# Patient Record
Sex: Male | Born: 1960 | Race: White | Hispanic: No | Marital: Married | Smoking: Current some day smoker
Health system: Southern US, Community
[De-identification: ages and names within clinical notes are randomized; demographics above are authoritative.]

## PROBLEM LIST (undated history)

## (undated) ENCOUNTER — Emergency Department: Discharge status: Absent without leave | Payer: BC Managed Care – PPO

## (undated) DIAGNOSIS — D649 Anemia, unspecified: Secondary | ICD-10-CM

## (undated) DIAGNOSIS — E785 Hyperlipidemia, unspecified: Secondary | ICD-10-CM

## (undated) DIAGNOSIS — Z972 Presence of dental prosthetic device (complete) (partial): Secondary | ICD-10-CM

## (undated) DIAGNOSIS — R079 Chest pain, unspecified: Secondary | ICD-10-CM

## (undated) DIAGNOSIS — R011 Cardiac murmur, unspecified: Secondary | ICD-10-CM

## (undated) DIAGNOSIS — N400 Enlarged prostate without lower urinary tract symptoms: Secondary | ICD-10-CM

## (undated) HISTORY — DX: Hyperlipidemia, unspecified: E78.5

---

## 2003-07-15 ENCOUNTER — Other Ambulatory Visit: Payer: Self-pay

## 2008-12-16 ENCOUNTER — Ambulatory Visit: Payer: Self-pay | Admitting: Internal Medicine

## 2010-01-03 HISTORY — PX: CARDIAC CATHETERIZATION: SHX172

## 2010-01-03 HISTORY — PX: PROSTATE BIOPSY: SHX241

## 2010-07-08 ENCOUNTER — Observation Stay: Payer: Self-pay | Admitting: Internal Medicine

## 2010-07-15 ENCOUNTER — Ambulatory Visit: Payer: Self-pay | Admitting: Family Medicine

## 2011-08-22 ENCOUNTER — Ambulatory Visit (INDEPENDENT_AMBULATORY_CARE_PROVIDER_SITE_OTHER): Payer: 59 | Admitting: Internal Medicine

## 2011-08-22 ENCOUNTER — Encounter: Payer: Self-pay | Admitting: Internal Medicine

## 2011-08-22 VITALS — BP 122/70 | HR 74 | Temp 98.0°F | Resp 18 | Ht 70.0 in | Wt 209.5 lb

## 2011-08-22 DIAGNOSIS — M629 Disorder of muscle, unspecified: Secondary | ICD-10-CM | POA: Insufficient documentation

## 2011-08-22 DIAGNOSIS — K111 Hypertrophy of salivary gland: Secondary | ICD-10-CM

## 2011-08-22 DIAGNOSIS — E785 Hyperlipidemia, unspecified: Secondary | ICD-10-CM

## 2011-08-22 DIAGNOSIS — Z1211 Encounter for screening for malignant neoplasm of colon: Secondary | ICD-10-CM

## 2011-08-22 DIAGNOSIS — M5382 Other specified dorsopathies, cervical region: Secondary | ICD-10-CM | POA: Insufficient documentation

## 2011-08-22 DIAGNOSIS — J342 Deviated nasal septum: Secondary | ICD-10-CM

## 2011-08-22 DIAGNOSIS — K5900 Constipation, unspecified: Secondary | ICD-10-CM

## 2011-08-22 NOTE — Assessment & Plan Note (Addendum)
Last labs done in June,  Untreated chol was 300,  Now 200.  Repeat labs due.

## 2011-08-22 NOTE — Progress Notes (Addendum)
Patient ID: Alexander Little, male   DOB: 13-Jun-1960, 51 y.o.   MRN: [id number]  Patient Active Problem List  Diagnosis  . Hyperlipidemia  . Acquired deviated nasal septum  . Parotid gland enlargement  . Constipation  . Screening for colon cancer    Subjective:  CC:   Chief Complaint  Patient presents with  . Establish Care    HPI:   Alexander Little is a 51 y.o. male who presents as a new patient to establish primary care with the chief complaint of New onset constipation, occurs once or twice per month.  Managed with prn stool softener.  Drinks  64 ounces of water daily.  Afternoon coffee occasionally. . No fiber supplement.  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  Total of 4 hours daily of nonstop driving ,  The rest of time walking or sleeping. No prior colonoscopy. Wants to do FOBT .  Healthy diverse diet.  Referred by wife Alexander Little, an RN at [facility].  History of chest pain with tobacco abuse ,  Normal cardiac cath by [facility] July 2012 ,.  Treated with prilosec 14 days about once every 3 months for recurrence of pain .  Still smoking 2 packs daily.   Prior PCP was [employer].  History of BPH for 20 yrs, recently had an elevated PSA,  Was referred for prostate biopsy by  Dr. Shirlee More  June 2013.  12 pt biopsy was normal; PSA elevation was  attributed to recent intercourse.      Past Medical History  Diagnosis Date  . Hyperlipidemia     History reviewed. No pertinent past surgical history.  Family History  Problem Relation Age of Onset  . Stroke Mother   . Heart disease Mother   . Mental illness Mother   . COPD Mother   . Cancer Father 78    brain ca  . COPD Father   . Early death Father     History   Social History  . Marital Status: Married    Spouse Name: N/A    Number of Children: N/A  . Years of Education: N/A   Occupational History  . Not on file.   Social History Main Topics  . Smoking status: Current Everyday Smoker -- 1.5 packs/day    Types:  Cigarettes  . Smokeless tobacco: Never Used  . Alcohol Use: Yes  . Drug Use: No  . Sexually Active: Not on file   Other Topics Concern  . Not on file   Social History Narrative  . No narrative on file   Allergies  Allergen Reactions  . Sulfa Antibiotics Rash    Review of Systems:   The remainder of the review of systems was negative except those addressed in the HPI.     Objective:  BP 122/70  Pulse 74  Temp 98 F (36.7 C) (Oral)  Resp 18  Ht 5\' 10"  (1.778 m)  Wt 209 lb 8 oz (95.029 kg)  BMI 30.06 kg/m2  SpO2 95%  General appearance: alert, cooperative and appears older than stated age. Asymmetric parotid gland enlargement L > R .   Ears: normal TM's and external ear canals both ears Throat: lips, mucosa, and tongue normal; teeth and gums normal.  No TMJ tenderness or clicking  Neck: no adenopathy, no carotid bruit, supple, symmetrical, trachea midline and thyroid not enlarged, symmetric, no tenderness/mass/nodules Back: symmetric, no curvature. ROM normal. No CVA tenderness. Lungs: clear to auscultation bilaterally Heart:  regular rate and rhythm, S1, S2 normal, no murmur, click, rub or gallop Abdomen: soft, non-tender; bowel sounds normal; no masses,  no organomegaly Pulses: 2+ and symmetric Skin: Skin color, texture, turgor normal. No rashes or lesions Lymph nodes: Cervical, supraclavicular, and axillary nodes normal.  Assessment and Plan:  Hyperlipidemia Last labs done in June,  Untreated chol was 300,  Now 200.  Repeat labs due.   Parotid gland enlargement Left parotid gland is enlarged relative to right.  Discussed with Erline Hau best way to image is with a CT ; however given patient's concurrent septal deviation (acquired) will refer to [facility] for evaluation.   Constipation Occasional, occurring once or twice monthly.  recommender fiber supplement.    Screening for colon cancer Patient was given choice and preferred FOBTs to screening colonoscopy.   Take home stool cards given.    Updated Medication List Outpatient Encounter Prescriptions as of 08/22/2011  Medication Sig Dispense Refill  . aspirin 81 MG tablet Take 81 mg by mouth daily.      . cetirizine (ZYRTEC) 10 MG tablet Take 10 mg by mouth daily.      . Multiple Vitamin (MULTIVITAMIN) tablet Take 1 tablet by mouth daily.      . simvastatin (ZOCOR) 20 MG tablet Take 20 mg by mouth every evening.      . Tamsulosin HCl (FLOMAX) 0.4 MG CAPS Take 0.4 mg by mouth daily.         Orders Placed This Encounter  Procedures  . Ambulatory referral to ENT    Return in about 6 months (around 02/22/2012).

## 2011-08-22 NOTE — Patient Instructions (Signed)
Please do the taqke home stool test as your screening test for colono Ca.  It is done annually .  I will contact  You about imaging your parotid gland

## 2011-08-23 ENCOUNTER — Encounter: Payer: Self-pay | Admitting: Internal Medicine

## 2011-08-23 DIAGNOSIS — Z1211 Encounter for screening for malignant neoplasm of colon: Secondary | ICD-10-CM | POA: Insufficient documentation

## 2011-08-23 DIAGNOSIS — K5900 Constipation, unspecified: Secondary | ICD-10-CM | POA: Insufficient documentation

## 2011-08-23 NOTE — Assessment & Plan Note (Signed)
Patient was given choice and preferred FOBTs to screening colonoscopy.  Take home stool cards given.

## 2011-08-23 NOTE — Assessment & Plan Note (Signed)
Left parotid gland is enlarged relative to right.  Discussed with Alexander Little best way to image is with a CT ; however given patient's concurrent septal deviation (acquired) will refer to [facility] for evaluation.

## 2011-08-23 NOTE — Assessment & Plan Note (Signed)
Occasional, occurring once or twice monthly.  recommender fiber supplement.

## 2011-08-24 ENCOUNTER — Other Ambulatory Visit: Payer: 59

## 2011-08-24 ENCOUNTER — Other Ambulatory Visit: Payer: Self-pay | Admitting: Internal Medicine

## 2011-08-24 DIAGNOSIS — Z1211 Encounter for screening for malignant neoplasm of colon: Secondary | ICD-10-CM

## 2011-09-01 ENCOUNTER — Ambulatory Visit: Payer: Self-pay | Admitting: Unknown Physician Specialty

## 2011-10-01 ENCOUNTER — Other Ambulatory Visit: Payer: Self-pay | Admitting: Internal Medicine

## 2011-10-31 ENCOUNTER — Telehealth: Payer: Self-pay | Admitting: Internal Medicine

## 2011-10-31 MED ORDER — FLUTICASONE PROPIONATE 50 MCG/ACT NA SUSP: 2.0000 | Freq: Every day | NASAL | Status: DC

## 2011-10-31 NOTE — Telephone Encounter (Signed)
[facility] [place]. He was told at his appointment that she would send this in for him.  Please call patient on his cell when this has been called in.

## 2011-10-31 NOTE — Telephone Encounter (Signed)
[facility]  Please call patient

## 2011-11-01 NOTE — Telephone Encounter (Signed)
Patient notified that Flonase was sent to pharmacy

## 2011-11-11 ENCOUNTER — Ambulatory Visit (INDEPENDENT_AMBULATORY_CARE_PROVIDER_SITE_OTHER): Payer: 59 | Admitting: Internal Medicine

## 2011-11-11 ENCOUNTER — Other Ambulatory Visit: Payer: Self-pay

## 2011-11-11 ENCOUNTER — Encounter: Payer: Self-pay | Admitting: Internal Medicine

## 2011-11-11 VITALS — BP 128/80 | HR 78 | Temp 98.6°F | Resp 12 | Ht 70.0 in | Wt 211.2 lb

## 2011-11-11 DIAGNOSIS — K111 Hypertrophy of salivary gland: Secondary | ICD-10-CM

## 2011-11-11 DIAGNOSIS — Z720 Tobacco use: Secondary | ICD-10-CM

## 2011-11-11 DIAGNOSIS — R972 Elevated prostate specific antigen [PSA]: Secondary | ICD-10-CM

## 2011-11-11 DIAGNOSIS — F17200 Nicotine dependence, unspecified, uncomplicated: Secondary | ICD-10-CM

## 2011-11-11 DIAGNOSIS — E041 Nontoxic single thyroid nodule: Secondary | ICD-10-CM

## 2011-11-11 DIAGNOSIS — J189 Pneumonia, unspecified organism: Secondary | ICD-10-CM | POA: Insufficient documentation

## 2011-11-11 MED ORDER — NICOTINE 21 MG/24HR TD PT24: 1.0000 | MEDICATED_PATCH | TRANSDERMAL | Status: DC

## 2011-11-11 MED ORDER — AMOXICILLIN-POT CLAVULANATE 875-125 MG PO TABS: 1.0000 | ORAL_TABLET | Freq: Two times a day (BID) | ORAL | Status: DC

## 2011-11-11 MED ORDER — HYDROCOD POLST-CHLORPHEN POLST 10-8 MG/5ML PO LQCR: 10.0000 mL | Freq: Every evening | ORAL | Status: DC | PRN

## 2011-11-11 NOTE — Patient Instructions (Addendum)
I am treating you for pneumonia with augmentin and a cough syrup with hydrocodone in it   Lavage your sinuses twice daly with Simply saline nasal spray.  Use benadryl 25 mg every 8 hours and Sudafed PE 10 to 30 every 8 hours to manage the drainage and congestion.  Gargle with salt water often for the sore throat.

## 2011-11-11 NOTE — Telephone Encounter (Signed)
Tussionex 10-8 mg and Augmentin 875-125 mg [facility].

## 2011-11-11 NOTE — Progress Notes (Signed)
Patient ID: Alexander Little, male   DOB: Jan 16, 1960, 51 y.o.   MRN: [id number]  Patient Active Problem List  Diagnosis  . Hyperlipidemia  . Acquired deviated nasal septum  . Parotid gland enlargement  . Constipation  . Screening for colon cancer  . Pneumonia  . Tobacco abuse  . Thyroid nodule  . Elevated PSA, less than 10 ng/ml    Subjective:  CC:   Chief Complaint  Patient presents with  . Cough    HPI:   Alexander Little a 51 y.o. male who presents for an acute visit with  2 weeks of sinus congestion.  Symptoms have now progressed to include chest abdomen and back.  Aches in those areas.   Hurts when he coughs and takes a deep breath and he is having a productive cough,  Thick yellow sputum with green tinge .  Some subjective fevers, . Wife Olegario Messier got sick from the influenza nasal mist, then got Shingles.  He has  flu shot about one month ago.      Past Medical History  Diagnosis Date  . Hyperlipidemia     Past Surgical History  Procedure Date  . Prostate biopsy 2012    normal  . Cardiac catheterization 2012    normal, [place]         The following portions of the patient's history were reviewed and updated as appropriate: Allergies, current medications, and problem list.    Review of Systems:   12 Pt  review of systems was negative except those addressed in the HPI,     History   Social History  . Marital Status: Married    Spouse Name: N/A    Number of Children: N/A  . Years of Education: N/A   Occupational History  . Not on file.   Social History Main Topics  . Smoking status: Current Every Day Smoker -- 1.5 packs/day    Types: Cigarettes  . Smokeless tobacco: Never Used  . Alcohol Use: Yes  . Drug Use: No  . Sexually Active: Not on file   Other Topics Concern  . Not on file   Social History Narrative  . No narrative on file    Objective:  BP 128/80  Pulse 78  Temp 98.6 F (37 C) (Oral)  Resp 12  Ht 5\' 10"  (1.778 m)  Wt 211 lb 4  oz (95.822 kg)  BMI 30.31 kg/m2  SpO2 98%  General appearance: alert, cooperative and appears stated age Ears: normal TM's and external ear canals both ears Throat: lips, mucosa, and tongue normal; teeth and gums normal Neck: no adenopathy, no carotid bruit, supple, symmetrical, trachea midline and thyroid not enlarged, symmetric, no tenderness/mass/nodules Back: symmetric, no curvature. ROM normal. No CVA tenderness. Lungs: clear to auscultation bilaterally Heart: regular rate and rhythm, S1, S2 normal, no murmur, click, rub or gallop Abdomen: soft, non-tender; bowel sounds normal; no masses,  no organomegaly Pulses: 2+ and symmetric Skin: Skin color, texture, turgor normal. No rashes or lesions Lymph nodes: Cervical, supraclavicular, and axillary nodes normal.  Assessment and Plan:  Pneumonia By history with pleuritic chest pain and purulent sputum. Empiric Augmentin. Chest x-ray pending.  Tobacco abuse The patient was counseled on the dangers of tobacco use, and was advised to quit.  Reviewed strategies to maximize success, including removing cigarettes and smoking materials from environment and nicoderm patches.  Parotid gland enlargement Patient was referred to [facility] who ordered a CT of the neck. Parotid glandswere symmetric and  Normal appearing but his left masseter muscle was prominent and an incidental finding of  1.6 cm right thyroid nodule was found. To my knowledge this has not been worked up. Patient will be referred to endocrinology for workup.  Thyroid nodule 1.6 cm right lobe, found during workup for parotid gland enlargement which was ruled out by CT of the neck. To my knowledge she has not been referred by ENT for biopsy. Referral to endocrinology for workup of thyroid nodule in place.  Elevated PSA, less than 10 ng/ml June 2013 with 12 point prostate biopsy normal.   Updated Medication List Outpatient Encounter Prescriptions as of 11/11/2011  Medication  Sig Dispense Refill  . aspirin 81 MG tablet Take 81 mg by mouth daily.      . cetirizine (ZYRTEC) 10 MG tablet Take 10 mg by mouth daily.      . fluticasone (FLONASE) 50 MCG/ACT nasal spray Place 2 sprays into the nose daily.  16 g  6  . Multiple Vitamin (MULTIVITAMIN) tablet Take 1 tablet by mouth daily.      . simvastatin (ZOCOR) 20 MG tablet TAKE 1 TABLET BY MOUTH ONCE A DAY AS DIRECTED FOR CHOLESTEROL  30 tablet  1  . Tamsulosin HCl (FLOMAX) 0.4 MG CAPS Take 0.4 mg by mouth daily.      Marland Kitchen amoxicillin-clavulanate (AUGMENTIN) 875-125 MG per tablet Take 1 tablet by mouth 2 (two) times daily.  14 tablet  0  . chlorpheniramine-HYDROcodone (TUSSIONEX) 10-8 MG/5ML LQCR Take 10 mLs by mouth at bedtime as needed.  180 mL  0  . nicotine (NICODERM CQ) 21 mg/24hr patch Place 1 patch onto the skin daily.  28 patch  0     Orders Placed This Encounter  Procedures  . DG Chest 2 View    No Follow-up on file.

## 2011-11-13 ENCOUNTER — Encounter: Payer: Self-pay | Admitting: Internal Medicine

## 2011-11-13 ENCOUNTER — Telehealth: Payer: Self-pay | Admitting: Internal Medicine

## 2011-11-13 DIAGNOSIS — E041 Nontoxic single thyroid nodule: Secondary | ICD-10-CM | POA: Insufficient documentation

## 2011-11-13 DIAGNOSIS — R972 Elevated prostate specific antigen [PSA]: Secondary | ICD-10-CM | POA: Insufficient documentation

## 2011-11-13 DIAGNOSIS — Z87891 Personal history of nicotine dependence: Secondary | ICD-10-CM | POA: Insufficient documentation

## 2011-11-13 NOTE — Assessment & Plan Note (Signed)
June 2013 with 12 point prostate biopsy normal.

## 2011-11-13 NOTE — Telephone Encounter (Signed)
Based on Alexander Little that when I was reviewing sunrise to look for the chest  x-ray that I sent him for I found that Dr. Jenne Campus had ordered a CT of his neck back in August and that his parotid glands were normal-appearing but that he had a 1.6 cm thyroid nodule. If Dr. Jenne Campus did work this up to rule out malignancy then this this needs to be done .  I'm making a referral to [facility] endocrinology

## 2011-11-13 NOTE — Assessment & Plan Note (Signed)
The patient was counseled on the dangers of tobacco use, and was advised to quit.  Reviewed strategies to maximize success, including removing cigarettes and smoking materials from environment and nicoderm patches.

## 2011-11-13 NOTE — Assessment & Plan Note (Signed)
By history with pleuritic chest pain and purulent sputum. Empiric Augmentin. Chest x-ray pending.

## 2011-11-13 NOTE — Assessment & Plan Note (Signed)
Patient was referred to [facility] who ordered a CT of the neck. Parotid glandswere symmetric and  Normal appearing but his left masseter muscle was prominent and an incidental finding of  1.6 cm right thyroid nodule was found. To my knowledge this has not been worked up. Patient will be referred to endocrinology for workup.

## 2011-11-13 NOTE — Assessment & Plan Note (Signed)
1.6 cm right lobe, found during workup for parotid gland enlargement which was ruled out by CT of the neck. To my knowledge she has not been referred by ENT for biopsy. Referral to endocrinology for workup of thyroid nodule in place.

## 2011-11-14 ENCOUNTER — Ambulatory Visit: Payer: Self-pay | Admitting: Internal Medicine

## 2011-11-15 ENCOUNTER — Telehealth: Payer: Self-pay | Admitting: Internal Medicine

## 2011-11-15 NOTE — Telephone Encounter (Signed)
Spoke to patient spouse gave her instructions for patient, per spouse patient is doing ok he does not want a biopsy at this time due to insurance co pays and deductible.

## 2011-11-15 NOTE — Telephone Encounter (Signed)
Chest x ray showing no infiltrate or pneumonia,  Mild COPD suggested. Continue  antibioitcs.

## 2011-11-15 NOTE — Telephone Encounter (Signed)
LEFT MESSAleft message on patient vm to call me back regarding xray results.

## 2011-11-16 NOTE — Telephone Encounter (Signed)
Left message on cell phone voicemail advising patient as instructed.   

## 2011-11-16 NOTE — Telephone Encounter (Signed)
I do not advise postponing this because of the risk of thyroid cancer .  I am going to refer him to [facility] End[facility] anyway and he can discuss when to have the workup and biopsy with them.

## 2011-11-25 ENCOUNTER — Encounter: Payer: Self-pay | Admitting: Internal Medicine

## 2012-02-06 ENCOUNTER — Other Ambulatory Visit: Payer: Self-pay | Admitting: General Practice

## 2012-02-06 ENCOUNTER — Other Ambulatory Visit: Payer: Self-pay | Admitting: Internal Medicine

## 2012-02-06 MED ORDER — SIMVASTATIN 20 MG PO TABS: ORAL_TABLET | ORAL | Status: DC

## 2012-02-06 MED ORDER — TAMSULOSIN HCL 0.4 MG PO CAPS: 0.4000 mg | ORAL_CAPSULE | Freq: Every day | ORAL | Status: DC

## 2012-02-06 NOTE — Telephone Encounter (Signed)
Already taken care of per pt.

## 2012-02-06 NOTE — Telephone Encounter (Signed)
Meds filled at new pharmacy per pt.

## 2012-02-06 NOTE — Telephone Encounter (Signed)
Pt says he spoke with a triage nurse this morning concerning his medication and [facility] office. Pt was wanting to know if [facility] could [facility] and see if we could get the refill and he also was going to try to switch over to [facility] pharmacy but pt says it would take a month to get set up with them because he has separate insurance than his wife and he is completely out of this medication. The medication is Simbastatin and flomax.

## 2012-02-25 ENCOUNTER — Other Ambulatory Visit: Payer: Self-pay | Admitting: Internal Medicine

## 2012-02-26 NOTE — Telephone Encounter (Signed)
patinet is overdue for LFTS by 2 months,  No refills on simvastatin until they are done.  CMET and fasting lipids. V58.69 and 272.4

## 2012-02-26 NOTE — Telephone Encounter (Signed)
Needs CMET and fasting lipids prior to refill

## 2012-02-27 ENCOUNTER — Ambulatory Visit: Payer: 59 | Admitting: Internal Medicine

## 2012-03-30 ENCOUNTER — Other Ambulatory Visit: Payer: Self-pay | Admitting: Internal Medicine

## 2012-04-30 ENCOUNTER — Other Ambulatory Visit: Payer: Self-pay | Admitting: *Deleted

## 2012-04-30 NOTE — Telephone Encounter (Signed)
Pt called, requesting refills of Simvastatin and Flomax to [facility]. Advised on past due for labwork, soonest he could schedule was 05/14/12. May I refill one time until appointment?

## 2012-05-01 MED ORDER — TAMSULOSIN HCL 0.4 MG PO CAPS: 0.4000 mg | ORAL_CAPSULE | Freq: Every day | ORAL | Status: DC

## 2012-05-01 MED ORDER — SIMVASTATIN 20 MG PO TABS: 20.0000 mg | ORAL_TABLET | Freq: Every day | ORAL | Status: DC

## 2012-05-01 NOTE — Telephone Encounter (Signed)
Yes, ok to refill 

## 2012-05-06 ENCOUNTER — Other Ambulatory Visit: Payer: Self-pay | Admitting: Internal Medicine

## 2012-05-07 NOTE — Telephone Encounter (Signed)
Refills denied. Last refilled 04/30/12

## 2012-05-09 ENCOUNTER — Other Ambulatory Visit: Payer: Self-pay | Admitting: Internal Medicine

## 2012-05-09 NOTE — Telephone Encounter (Signed)
Rx sent to pharmacy by escript  

## 2012-05-11 ENCOUNTER — Telehealth: Payer: Self-pay | Admitting: *Deleted

## 2012-05-11 DIAGNOSIS — E785 Hyperlipidemia, unspecified: Secondary | ICD-10-CM

## 2012-05-11 DIAGNOSIS — Z79899 Other long term (current) drug therapy: Secondary | ICD-10-CM

## 2012-05-11 NOTE — Telephone Encounter (Signed)
Pt is coming in for labs Monday 05.12.2014 what labs and dx would you like?  Thank you

## 2012-05-14 ENCOUNTER — Other Ambulatory Visit (INDEPENDENT_AMBULATORY_CARE_PROVIDER_SITE_OTHER): Payer: 59

## 2012-05-14 DIAGNOSIS — E785 Hyperlipidemia, unspecified: Secondary | ICD-10-CM

## 2012-05-14 DIAGNOSIS — Z79899 Other long term (current) drug therapy: Secondary | ICD-10-CM

## 2012-05-14 LAB — COMPREHENSIVE METABOLIC PANEL
BUN: 11 mg/dL (ref 6–23)
CO2: 26 mEq/L (ref 19–32)
Calcium: 8.9 mg/dL (ref 8.4–10.5)
Chloride: 103 mEq/L (ref 96–112)
Creatinine, Ser: 0.9 mg/dL (ref 0.4–1.5)
GFR: 96.35 mL/min (ref >60.00)
Total Bilirubin: 0.4 mg/dL (ref 0.3–1.2)

## 2012-05-14 LAB — LIPID PANEL
Cholesterol: 186 mg/dL (ref 0–200)
HDL: 37.8 mg/dL — ABNORMAL LOW (ref >39.00)
Triglycerides: 212 mg/dL — ABNORMAL HIGH (ref 0.0–149.0)
VLDL: 42.4 mg/dL — ABNORMAL HIGH (ref 0.0–40.0)

## 2012-06-30 ENCOUNTER — Other Ambulatory Visit: Payer: Self-pay | Admitting: Internal Medicine

## 2012-07-04 ENCOUNTER — Ambulatory Visit: Payer: 59 | Admitting: Internal Medicine

## 2012-07-16 ENCOUNTER — Ambulatory Visit (INDEPENDENT_AMBULATORY_CARE_PROVIDER_SITE_OTHER): Payer: 59 | Admitting: Internal Medicine

## 2012-07-16 ENCOUNTER — Encounter: Payer: Self-pay | Admitting: Internal Medicine

## 2012-07-16 VITALS — BP 128/74 | HR 69 | Temp 98.3°F | Resp 14 | Wt 213.5 lb

## 2012-07-16 DIAGNOSIS — E669 Obesity, unspecified: Secondary | ICD-10-CM | POA: Insufficient documentation

## 2012-07-16 DIAGNOSIS — Z720 Tobacco use: Secondary | ICD-10-CM

## 2012-07-16 DIAGNOSIS — E785 Hyperlipidemia, unspecified: Secondary | ICD-10-CM

## 2012-07-16 DIAGNOSIS — F17200 Nicotine dependence, unspecified, uncomplicated: Secondary | ICD-10-CM

## 2012-07-16 DIAGNOSIS — E041 Nontoxic single thyroid nodule: Secondary | ICD-10-CM

## 2012-07-16 DIAGNOSIS — R05 Cough: Secondary | ICD-10-CM

## 2012-07-16 DIAGNOSIS — R059 Cough, unspecified: Secondary | ICD-10-CM

## 2012-07-16 DIAGNOSIS — Z1211 Encounter for screening for malignant neoplasm of colon: Secondary | ICD-10-CM

## 2012-07-16 MED ORDER — FLUTICASONE PROPIONATE 50 MCG/ACT NA SUSP: 2.0000 | Freq: Every day | NASAL | Status: DC

## 2012-07-16 MED ORDER — SIMVASTATIN 40 MG PO TABS: ORAL_TABLET | ORAL | Status: DC

## 2012-07-16 NOTE — Assessment & Plan Note (Addendum)
He has a 2 pack daily habit Smoking cessation instruction/counseling given:  counseled patient on the dangers of tobacco use, advised patient to stop smoking, and reviewed strategies to maximize success .  Nicoderm transdermal patch refilled

## 2012-07-16 NOTE — Assessment & Plan Note (Signed)
He was referred to [facility] Endocrinology last Fall for evaluation of a 1.6 cm thyroid nodule but never went.  Referral again recommended to rule out thyroid CA

## 2012-07-16 NOTE — Assessment & Plan Note (Addendum)
I have addressed  BMI and recommended a low glycemic index diet utilizing smaller more frequent meals to increase metabolism.  I have also recommended that patient start exercising with a goal of 30 minutes of aerobic exercise a minimum of 5 days per week.  

## 2012-07-16 NOTE — Assessment & Plan Note (Addendum)
LDL is not at goal on current  dose of zocor given his risk factors.  Will increase simvastatin to 40 mg

## 2012-07-16 NOTE — Assessment & Plan Note (Addendum)
He has deferred colonoscopy,  continue annual FOBTs

## 2012-07-16 NOTE — Progress Notes (Signed)
Patient ID: Alexander Little, male   DOB: 1960/08/26, 52 y.o.   MRN: [id number]    Patient Active Problem List   Diagnosis Date Noted  . Obesity, unspecified 07/16/2012  . Tobacco abuse 11/13/2011  . Thyroid nodule 11/13/2011  . Elevated PSA, less than 10 ng/ml 11/13/2011  . Constipation 08/23/2011  . Screening for colon cancer 08/23/2011  . Acquired deviated nasal septum 08/22/2011  . Parotid gland enlargement 08/22/2011  . Hyperlipidemia     Subjective:  CC:   Chief Complaint  Patient presents with  . Follow-up    HPI:   Alexander Little a 52 y.o. male who presents 6 month follow up on chronic conditions, including hypertension, hyperlipidemia, tobacco abuse and obesity.  He feels generally well,,  Has lost a few lbs although not really trying . Still smoking/  He has not seen Endocrinology for the incidental thyroid nodule found last fall for evaluation of parotid enlargement.  6 month follow up on chronic conditions, including hypertension, hyperlipidemia, tobacco abuse and obesity.  He feels generally well,,  Has lost a few lbs although not really trying . Still smoking/  He has not seen Endocrinology for the incidental thyroid nodule found last fall for evaluation of parotid enlargement.    Past Medical History  Diagnosis Date  . Hyperlipidemia     Past Surgical History  Procedure Laterality Date  . Prostate biopsy  2012    normal  . Cardiac catheterization  2012    normal, [place]       The following portions of the patient's history were reviewed and updated as appropriate: Allergies, current medications, and problem list.    Review of Systems:   12 Pt  review of systems was negative except those addressed in the HPI,     History   Social History  . Marital Status: Married    Spouse Name: N/A    Number of Children: N/A  . Years of Education: N/A   Occupational History  . Not on file.   Social History Main Topics  . Smoking status: Current Every  Day Smoker -- 1.50 packs/day    Types: Cigarettes  . Smokeless tobacco: Never Used  . Alcohol Use: Yes  . Drug Use: No  . Sexually Active: Not on file   Other Topics Concern  . Not on file   Social History Narrative  . No narrative on file    Objective:  BP 128/74  Pulse 69  Temp(Src) 98.3 F (36.8 C) (Oral)  Resp 14  Wt 213 lb 8 oz (96.843 kg)  BMI 30.63 kg/m2  SpO2 97%  General appearance: alert, cooperative and appears stated age Ears: normal TM's and external ear canals both ears Throat: lips, mucosa, and tongue normal; teeth and gums normal Neck: no adenopathy, no carotid bruit, supple, symmetrical, trachea midline and thyroid not enlarged, symmetric, no tenderness/mass/nodules Back: symmetric, no curvature. ROM normal. No CVA tenderness. Lungs: clear to auscultation bilaterally Heart: regular rate and rhythm, S1, S2 normal, no murmur, click, rub or gallop Abdomen: soft, non-tender; bowel sounds normal; no masses,  no organomegaly Pulses: 2+ and symmetric Skin: Skin color, texture, turgor normal. No rashes or lesions Lymph nodes: Cervical, supraclavicular, and axillary nodes normal.  Assessment and Plan:  Hyperlipidemia LDL is not at goal on current  dose of zocor given his risk factors.  Will increase simvastatin to 40 mg   Tobacco abuse He has a 2 pack daily habit Smoking cessation instruction/counseling given:  counseled patient  on the dangers of tobacco use, advised patient to stop smoking, and reviewed strategies to maximize success .  Nicoderm transdermal patch refilled  Obesity, unspecified I have addressed  BMI and recommended a low glycemic index diet utilizing smaller more frequent meals to increase metabolism.  I have also recommended that patient start exercising with a goal of 30 minutes of aerobic exercise a minimum of 5 days per week.  Screening for colon cancer He has deferred colonoscopy,  continue annual FOBTs  Thyroid nodule He was referred  to [facility] Endocrinology last Fall for evaluation of a 1.6 cm thyroid nodule but never went.  Referral again recommended to rule out thyroid CA   Updated Medication List Outpatient Encounter Prescriptions as of 07/16/2012  Medication Sig Dispense Refill  . aspirin 81 MG tablet Take 81 mg by mouth daily.      . cetirizine (ZYRTEC) 10 MG tablet Take 10 mg by mouth daily.      . fluticasone (FLONASE) 50 MCG/ACT nasal spray Place 2 sprays into the nose daily.  48 g  3  . Multiple Vitamin (MULTIVITAMIN) tablet Take 1 tablet by mouth daily.      . simvastatin (ZOCOR) 40 MG tablet TAKE 1 TABLET BY MOUTH EVERY DAY AS DIRECTED, FOR CHOLESTEROL  90 tablet  1  . tamsulosin (FLOMAX) 0.4 MG CAPS Take 1 capsule (0.4 mg total) by mouth daily.  90 capsule  0  . tamsulosin (FLOMAX) 0.4 MG CAPS TAKE ONE CAPSULE BY MOUTH EVERY DAY  30 capsule  0  . [DISCONTINUED] fluticasone (FLONASE) 50 MCG/ACT nasal spray Place 2 sprays into the nose daily.  16 g  6  . [DISCONTINUED] simvastatin (ZOCOR) 20 MG tablet Take 1 tablet (20 mg total) by mouth daily.  90 tablet  0  . [DISCONTINUED] simvastatin (ZOCOR) 20 MG tablet TAKE 1 TABLET BY MOUTH EVERY DAY AS DIRECTED, FOR CHOLESTEROL  30 tablet  0  . amoxicillin-clavulanate (AUGMENTIN) 875-125 MG per tablet Take 1 tablet by mouth 2 (two) times daily.  14 tablet  0  . chlorpheniramine-HYDROcodone (TUSSIONEX) 10-8 MG/5ML LQCR Take 10 mLs by mouth at bedtime as needed.  180 mL  0  . nicotine (NICODERM CQ) 21 mg/24hr patch Place 1 patch onto the skin daily.  28 patch  0   No facility-administered encounter medications on file as of 07/16/2012.     Orders Placed This Encounter  Procedures  . DG Chest 2 View  . Ambulatory referral to Endocrinology    No Follow-up on file.

## 2012-07-17 ENCOUNTER — Telehealth: Payer: Self-pay | Admitting: Internal Medicine

## 2012-07-17 NOTE — Telephone Encounter (Signed)
FYI to Dr. Darrick Huntsman

## 2012-07-17 NOTE — Telephone Encounter (Signed)
Spoke with patients wife, gave her all the information about the [facility] Endocrinology appointment. She called back and stated the patient did not have a clue about being sent to that doctor. They have cancelled the appointment as the patient is not planning to fix this issue.

## 2012-07-17 NOTE — Telephone Encounter (Signed)
Please patient and his wife Alexander Little who is a nurse she is on his HIPAA form  that he has a 1.6 cm nodule on his thyroid and we discussed at his last visit and made a referral and he never went. This could be cancer. He needs to go

## 2012-07-20 NOTE — Telephone Encounter (Signed)
Left message to call back  

## 2012-08-02 NOTE — Telephone Encounter (Signed)
Patient wife notified.

## 2012-09-27 ENCOUNTER — Other Ambulatory Visit: Payer: Self-pay | Admitting: Internal Medicine

## 2013-01-14 ENCOUNTER — Encounter: Payer: Self-pay | Admitting: Internal Medicine

## 2013-01-14 ENCOUNTER — Encounter (INDEPENDENT_AMBULATORY_CARE_PROVIDER_SITE_OTHER): Payer: Self-pay

## 2013-01-14 ENCOUNTER — Ambulatory Visit (INDEPENDENT_AMBULATORY_CARE_PROVIDER_SITE_OTHER): Payer: 59 | Admitting: Internal Medicine

## 2013-01-14 VITALS — BP 122/64 | HR 60 | Temp 98.4°F | Resp 16 | Wt 204.2 lb

## 2013-01-14 DIAGNOSIS — E785 Hyperlipidemia, unspecified: Secondary | ICD-10-CM

## 2013-01-14 DIAGNOSIS — Z1211 Encounter for screening for malignant neoplasm of colon: Secondary | ICD-10-CM

## 2013-01-14 DIAGNOSIS — Z720 Tobacco use: Secondary | ICD-10-CM

## 2013-01-14 DIAGNOSIS — R972 Elevated prostate specific antigen [PSA]: Secondary | ICD-10-CM

## 2013-01-14 DIAGNOSIS — F17200 Nicotine dependence, unspecified, uncomplicated: Secondary | ICD-10-CM

## 2013-01-14 DIAGNOSIS — E041 Nontoxic single thyroid nodule: Secondary | ICD-10-CM

## 2013-01-14 DIAGNOSIS — Z79899 Other long term (current) drug therapy: Secondary | ICD-10-CM

## 2013-01-14 DIAGNOSIS — K219 Gastro-esophageal reflux disease without esophagitis: Secondary | ICD-10-CM | POA: Insufficient documentation

## 2013-01-14 DIAGNOSIS — E669 Obesity, unspecified: Secondary | ICD-10-CM

## 2013-01-14 DIAGNOSIS — K111 Hypertrophy of salivary gland: Secondary | ICD-10-CM

## 2013-01-14 LAB — COMPREHENSIVE METABOLIC PANEL
ALBUMIN: 4.2 g/dL (ref 3.5–5.2)
ALK PHOS: 65 U/L (ref 39–117)
ALT: 18 U/L (ref 0–53)
AST: 21 U/L (ref 0–37)
BUN: 9 mg/dL (ref 6–23)
CALCIUM: 8.9 mg/dL (ref 8.4–10.5)
CHLORIDE: 104 meq/L (ref 96–112)
CO2: 28 mEq/L (ref 19–32)
CREATININE: 0.9 mg/dL (ref 0.4–1.5)
GFR: 94.86 mL/min (ref >60.00)
Glucose, Bld: 94 mg/dL (ref 70–99)
POTASSIUM: 4.4 meq/L (ref 3.5–5.1)
Sodium: 138 mEq/L (ref 135–145)
Total Bilirubin: 0.8 mg/dL (ref 0.3–1.2)
Total Protein: 6.8 g/dL (ref 6.0–8.3)

## 2013-01-14 LAB — LIPID PANEL
CHOL/HDL RATIO: 5
Cholesterol: 160 mg/dL (ref 0–200)
HDL: 35.1 mg/dL — AB (ref >39.00)
LDL CALC: 100 mg/dL — AB (ref 0–99)
TRIGLYCERIDES: 125 mg/dL (ref 0.0–149.0)
VLDL: 25 mg/dL (ref 0.0–40.0)

## 2013-01-14 NOTE — Assessment & Plan Note (Signed)
Improved. I have addressed  BMI and recommended a low glycemic index diet utilizing smaller more frequent meals to increase metabolism.  I have also recommended that patient start exercising with a goal of 30 minutes of aerobic exercise a minimum of 5 days per week.

## 2013-01-14 NOTE — Assessment & Plan Note (Signed)
Continue simvastatin 40 mg daily for elevated 10 yr risk of  CAD based on Framingham calculation

## 2013-01-14 NOTE — Progress Notes (Signed)
Pre-visit discussion using our clinic review tool. No additional management support is needed unless otherwise documented below in the visit note.  

## 2013-01-14 NOTE — Assessment & Plan Note (Addendum)
Prior prostate biopsy by Escoto Blocker was normal June 2014.  Annual follow up recommended.

## 2013-01-14 NOTE — Assessment & Plan Note (Addendum)
He was evaluated Sept 2013 by Anda Latina who advised havin g a CT ,  Which patient decline at the time. Advised to have definitive evaluation given his increased risk for CA due to tobacco abuse . Will refer back to [facility] for after the CXT is done for biopsy

## 2013-01-14 NOTE — Assessment & Plan Note (Addendum)
He was referred to [facility] last Fall for evaluation of a 1.6 cm thyroid nodule but never went.  He would liek to start with one referral to ENT

## 2013-01-14 NOTE — Progress Notes (Signed)
Patient ID: Alexander Little, male   DOB: 19-Jun-1960, 53 y.o.   MRN: [id number]   Patient Active Problem List   Diagnosis Date Noted  . GERD (gastroesophageal reflux disease) 01/14/2013  . Obesity, unspecified 07/16/2012  . Tobacco abuse 11/13/2011  . Thyroid nodule 11/13/2011  . Elevated PSA, less than 10 ng/ml 11/13/2011  . Constipation 08/23/2011  . Screening for colon cancer 08/23/2011  . Acquired deviated nasal septum 08/22/2011  . Parotid gland enlargement 08/22/2011  . Hyperlipidemia     Subjective:  CC:   Chief Complaint  Patient presents with  . Follow-up    11month    HPI:   Alexander Little a 53 y.o. male who presents for 6 month follow up on obesity,  parotid gland enlargement, elevated PSA, hyperlipidemia and tobacco abuse. He feels fine.  Has lost  Some weight by reducing portion sizes but not exercising regularly yet.  Has reduced his tobacco use from 2 packs to 1 pack daily and plans to continue gradual reduction  Smokes both at work and at home.  Wife Alexander Little also smokes despite being an Therapist, sports.  Marland Kitchen Had the parotid gland evaluated by Anda Latina but deferred imaging and biopsy. Father died of head and neck Ca and he is avoiding workup due to fear and cost given his high unmet deductible. Saw Dr Garms Blocker in past for elevated PSA and had a normal prostate biopsy in June 2014.  Has annual follow up.   Tolerating medications without side effects . Not fasting today.   Past Medical History  Diagnosis Date  . Hyperlipidemia     Past Surgical History  Procedure Laterality Date  . Prostate biopsy  2012    normal  . Cardiac catheterization  2012    normal, [place]       The following portions of the patient's history were reviewed and updated as appropriate: Allergies, current medications, and problem list.    Review of Systems:   12 Pt  review of systems was negative except those addressed in the HPI,     History   Social History  . Marital Status: Married   Spouse Name: N/A    Number of Children: N/A  . Years of Education: N/A   Occupational History  . Not on file.   Social History Main Topics  . Smoking status: Current Every Day Smoker -- 1.50 packs/day    Types: Cigarettes  . Smokeless tobacco: Never Used  . Alcohol Use: Yes  . Drug Use: No  . Sexual Activity: Not on file   Other Topics Concern  . Not on file   Social History Narrative  . No narrative on file    Objective:  Filed Vitals:   01/14/13 0800  BP: 122/64  Pulse: 60  Temp: 98.4 F (36.9 C)  Resp: 16     General appearance: alert, cooperative and appears stated age Ears: normal TM's and external ear canals both ears Throat: lips, mucosa, and tongue normal; teeth and gums normal Neck: no adenopathy, no carotid bruit, supple, symmetrical, trachea midline , thyroid nodule ,  Left parotid gland enlargement.  Back: symmetric, no curvature. ROM normal. No CVA tenderness. Lungs: clear to auscultation bilaterally Heart: regular rate and rhythm, S1, S2 normal, no murmur, click, rub or gallop Abdomen: soft, non-tender; bowel sounds normal; no masses,  no organomegaly Pulses: 2+ and symmetric Skin: Skin color, texture, turgor normal. No rashes or lesions Lymph nodes: Cervical, supraclavicular, and axillary nodes normal.  Assessment and Plan:  Tobacco abuse Has reduced his daily use from  packs daily to one pack daily.  Quit once for 3 months .  Plan is for gradual rrdeucions,  Discussed ways to quit .  Smokes mostly at work,  But smokes in th ehome as well.  counselling given   Elevated PSA, less than 10 ng/ml Prior prostate biopsy by [facility] Blocker was normal June 2014.  Annual follow up recommended.   Parotid gland enlargement He was evaluated Sept 2013 by Anda Latina who advised havin g a CT ,  Which patient decline at the time. Advised to have definitive evaluation given his increased risk for CA due to tobacco abuse . Will refer back to [facility] for after the CXT is done  for biopsy   Thyroid nodule He was referred to [facility] last Fall for evaluation of a 1.6 cm thyroid nodule but never went.  He would liek to start with one referral to ENT    Hyperlipidemia Continue simvastatin 40 mg daily for elevated 10 yr risk of  CAD based on Fr[place]ham calculation    Obesity, unspecified Improved. I have addressed  BMI and recommended a low glycemic index diet utilizing smaller more frequent meals to increase metabolism.  I have also recommended that patient start exercising with a goal of 30 minutes of aerobic exercise a minimum of 5 days per week.   Updated Medication List Outpatient Encounter Prescriptions as of 01/14/2013  Medication Sig  . aspirin 81 MG tablet Take 81 mg by mouth daily.  . cetirizine (ZYRTEC) 10 MG tablet Take 10 mg by mouth daily.  . fluticasone (FLONASE) 50 MCG/ACT nasal spray Place 2 sprays into the nose daily.  . Multiple Vitamin (MULTIVITAMIN) tablet Take 1 tablet by mouth daily.  . simvastatin (ZOCOR) 40 MG tablet Take 1 tablet by mouth  daily as directed for  cholesterol  . tamsulosin (FLOMAX) 0.4 MG CAPS Take 1 capsule (0.4 mg total) by mouth daily.  Marland Kitchen amoxicillin-clavulanate (AUGMENTIN) 875-125 MG per tablet Take 1 tablet by mouth 2 (two) times daily.  . chlorpheniramine-HYDROcodone (TUSSIONEX) 10-8 MG/5ML LQCR Take 10 mLs by mouth at bedtime as needed.  . nicotine (NICODERM CQ) 21 mg/24hr patch Place 1 patch onto the skin daily.  . [DISCONTINUED] tamsulosin (FLOMAX) 0.4 MG CAPS TAKE ONE CAPSULE BY MOUTH EVERY DAY

## 2013-01-14 NOTE — Patient Instructions (Signed)
Congratulations on the weight loss!  referral to Dr Tami Ribas to evaluate the parotid gland Please do the take home stool test as your annual colon CA screen   Good work on the smoking reduction.  Here are a few helpful suggestions  Smoking Cessation, Tips for Success If you are ready to quit smoking, congratulations! You have chosen to help yourself be healthier. Cigarettes bring nicotine, tar, carbon monoxide, and other irritants into your body. Your lungs, heart, and blood vessels will be able to work better without these poisons. There are many different ways to quit smoking. Nicotine gum, nicotine patches, a nicotine inhaler, or nicotine nasal spray can help with physical craving. Hypnosis, support groups, and medicines help break the habit of smoking. WHAT THINGS CAN I DO TO MAKE QUITTING EASIER?  Here are some tips to help you quit for good:  Pick a date when you will quit smoking completely. Tell all of your friends and family about your plan to quit on that date.  Do not try to slowly cut down on the number of cigarettes you are smoking. Pick a quit date and quit smoking completely starting on that day.  Throw away all cigarettes.   Clean and remove all ashtrays from your home, work, and car.   On a card, write down your reasons for quitting. Carry the card with you and read it when you get the urge to smoke.   Cleanse your body of nicotine. Drink enough water and fluids to keep your urine clear or pale yellow. Do this after quitting to flush the nicotine from your body.   Learn to predict your moods. Do not let a bad situation be your excuse to have a cigarette. Some situations in your life might tempt you into wanting a cigarette.   Never have "just one" cigarette. It leads to wanting another and another. Remind yourself of your decision to quit.   Change habits associated with smoking. If you smoked while driving or when feeling stressed, try other activities to replace  smoking. Stand up when drinking your coffee. Brush your teeth after eating. Sit in a different chair when you read the paper. Avoid alcohol while trying to quit, and try to drink fewer caffeinated beverages. Alcohol and caffeine may urge you to smoke.   Avoid foods and drinks that can trigger a desire to smoke, such as sugary or spicy foods and alcohol.   Ask people who smoke not to smoke around you.   Have something planned to do right after eating or having a cup of coffee. For example, plan to take a walk or exercise.   Try a relaxation exercise to calm you down and decrease your stress. Remember, you may be tense and nervous for the first 2 weeks after you quit, but this will pass.   Find new activities to keep your hands busy. Play with a pen, coin, or rubber band. Doodle or draw things on paper.   Brush your teeth right after eating. This will help cut down on the craving for the taste of tobacco after meals. You can also try mouthwash.   Use oral substitutes in place of cigarettes. Try using lemon drops, carrots, cinnamon sticks, or chewing gum. Keep them handy so they are available when you have the urge to smoke.   When you have the urge to smoke, try deep breathing.   Designate your home as a nonsmoking area.   If you are a heavy smoker, ask your health  care provider about a prescription for nicotine chewing gum. It can ease your withdrawal from nicotine.   Reward yourself. Set aside the cigarette money you save and buy yourself something nice.   Look for support from others. Join a support group or smoking cessation program. Ask someone at home or at work to help you with your plan to quit smoking.   Always ask yourself, "Do I need this cigarette or is this just a reflex?" Tell yourself, "Today, I choose not to smoke," or "I do not want to smoke." You are reminding yourself of your decision to quit.  Do not replace cigarette smoking with electronic cigarettes  (commonly called e-cigarettes). The safety of e-cigarettes is unknown, and some may contain harmful chemicals.  If you relapse, do not give up! Plan ahead and think about what you will do the next time you get the urge to smoke.  HOW WILL I FEEL WHEN I QUIT SMOKING? You may have symptoms of withdrawal because your body is used to nicotine (the addictive substance in cigarettes). You may crave cigarettes, be irritable, feel very hungry, cough often, get headaches, or have difficulty concentrating. The withdrawal symptoms are only temporary. They are strongest when you first quit but will go away within 10 14 days. When withdrawal symptoms occur, stay in control. Think about your reasons for quitting. Remind yourself that these are signs that your body is healing and getting used to being without cigarettes. Remember that withdrawal symptoms are easier to treat than the major diseases that smoking can cause.  Even after the withdrawal is over, expect periodic urges to smoke. However, these cravings are generally short lived and will go away whether you smoke or not. Do not smoke!  WHAT RESOURCES ARE AVAILABLE TO HELP ME QUIT SMOKING? Your health care provider can direct you to community resources or hospitals for support, which may include:  Group support.  Education.  Hypnosis.  Therapy. Document Released: 09/18/2003 Document Revised: 10/10/2012 Document Reviewed: 06/07/2012 [facility] Patient Information 2014 [place], [place].

## 2013-01-14 NOTE — Assessment & Plan Note (Addendum)
Has reduced his daily use from  packs daily to one pack daily.  Quit once for 3 months .  Plan is for gradual rrdeucions,  Discussed ways to quit .  Smokes mostly at work,  But smokes in th [place] as well.  counselling given

## 2013-01-15 ENCOUNTER — Encounter: Payer: Self-pay | Admitting: Internal Medicine

## 2013-01-15 MED ORDER — SIMVASTATIN 40 MG PO TABS: ORAL_TABLET | ORAL | Status: DC

## 2013-01-15 NOTE — Telephone Encounter (Signed)
Larene Beach,, see my chart message re multiple calls not being returned.  Please tell Aaron Edelman that we still have a voice mail issue

## 2013-01-18 NOTE — Telephone Encounter (Signed)
I have left message for patient to return my call. I also went around the clinic to see if I could see a voicemail that had these messages on it and there weren't any messages. i will ask the patient who he spoke with in regards to his multiple calls.

## 2013-01-28 ENCOUNTER — Ambulatory Visit: Payer: Self-pay | Admitting: Unknown Physician Specialty

## 2013-02-02 ENCOUNTER — Telehealth: Payer: Self-pay | Admitting: Internal Medicine

## 2013-02-02 NOTE — Telephone Encounter (Signed)
Relevant patient education assigned to patient using Emmi. ° °

## 2013-03-04 DIAGNOSIS — R220 Localized swelling, mass and lump, head: Secondary | ICD-10-CM | POA: Insufficient documentation

## 2013-03-05 LAB — FECAL OCCULT BLOOD, GUAIAC: FECAL OCCULT BLD: NEGATIVE

## 2013-03-29 ENCOUNTER — Other Ambulatory Visit (INDEPENDENT_AMBULATORY_CARE_PROVIDER_SITE_OTHER): Payer: 59

## 2013-03-29 DIAGNOSIS — Z1211 Encounter for screening for malignant neoplasm of colon: Secondary | ICD-10-CM

## 2013-03-29 LAB — FECAL OCCULT BLOOD, IMMUNOCHEMICAL: FECAL OCCULT BLD: NEGATIVE

## 2013-03-31 ENCOUNTER — Encounter: Payer: Self-pay | Admitting: Internal Medicine

## 2013-04-02 NOTE — Telephone Encounter (Signed)
Mailed unread message to pt  

## 2013-06-10 ENCOUNTER — Other Ambulatory Visit: Payer: Self-pay | Admitting: Internal Medicine

## 2013-06-10 ENCOUNTER — Other Ambulatory Visit: Payer: Self-pay | Admitting: *Deleted

## 2013-06-10 MED ORDER — SIMVASTATIN 40 MG PO TABS: ORAL_TABLET | ORAL | Status: DC

## 2013-06-10 MED ORDER — TAMSULOSIN HCL 0.4 MG PO CAPS: 0.4000 mg | ORAL_CAPSULE | Freq: Every day | ORAL | Status: DC

## 2013-06-10 NOTE — Telephone Encounter (Signed)
Pt left VM, needing refills. Left message, notifying that they were sent in.

## 2013-07-22 ENCOUNTER — Other Ambulatory Visit: Payer: Self-pay | Admitting: Internal Medicine

## 2013-07-22 NOTE — Telephone Encounter (Signed)
Medication not on current medication list.  Last OV 1.12.15.  Please advise refill.

## 2013-07-24 NOTE — Telephone Encounter (Signed)
Nicotine patch refilled

## 2013-08-05 ENCOUNTER — Other Ambulatory Visit: Payer: Self-pay | Admitting: Internal Medicine

## 2013-09-08 ENCOUNTER — Other Ambulatory Visit: Payer: Self-pay | Admitting: Internal Medicine

## 2013-11-04 ENCOUNTER — Encounter: Payer: Self-pay | Admitting: Internal Medicine

## 2013-11-04 ENCOUNTER — Ambulatory Visit (INDEPENDENT_AMBULATORY_CARE_PROVIDER_SITE_OTHER): Payer: 59 | Admitting: Internal Medicine

## 2013-11-04 VITALS — BP 154/86 | HR 70 | Temp 98.7°F | Resp 16 | Ht 70.0 in | Wt 220.5 lb

## 2013-11-04 DIAGNOSIS — M5382 Other specified dorsopathies, cervical region: Secondary | ICD-10-CM

## 2013-11-04 DIAGNOSIS — R972 Elevated prostate specific antigen [PSA]: Secondary | ICD-10-CM

## 2013-11-04 DIAGNOSIS — E041 Nontoxic single thyroid nodule: Secondary | ICD-10-CM

## 2013-11-04 DIAGNOSIS — E669 Obesity, unspecified: Secondary | ICD-10-CM

## 2013-11-04 DIAGNOSIS — M629 Disorder of muscle, unspecified: Secondary | ICD-10-CM

## 2013-11-04 DIAGNOSIS — M952 Other acquired deformity of head: Secondary | ICD-10-CM

## 2013-11-04 DIAGNOSIS — Z23 Encounter for immunization: Secondary | ICD-10-CM

## 2013-11-04 DIAGNOSIS — Z79899 Other long term (current) drug therapy: Secondary | ICD-10-CM

## 2013-11-04 LAB — COMPREHENSIVE METABOLIC PANEL
ALBUMIN: 3.7 g/dL (ref 3.5–5.2)
ALT: 26 U/L (ref 0–53)
AST: 30 U/L (ref 0–37)
Alkaline Phosphatase: 57 U/L (ref 39–117)
BUN: 11 mg/dL (ref 6–23)
CALCIUM: 8.7 mg/dL (ref 8.4–10.5)
CO2: 23 mEq/L (ref 19–32)
Chloride: 103 mEq/L (ref 96–112)
Creatinine, Ser: 1 mg/dL (ref 0.4–1.5)
GFR: 86.66 mL/min (ref >60.00)
Glucose, Bld: 90 mg/dL (ref 70–99)
POTASSIUM: 4.5 meq/L (ref 3.5–5.1)
SODIUM: 136 meq/L (ref 135–145)
TOTAL PROTEIN: 7 g/dL (ref 6.0–8.3)
Total Bilirubin: 0.7 mg/dL (ref 0.2–1.2)

## 2013-11-04 MED ORDER — FLUTICASONE PROPIONATE 50 MCG/ACT NA SUSP: 2.0000 | Freq: Every day | NASAL | Status: DC

## 2013-11-04 NOTE — Progress Notes (Signed)
Patient ID: Alexander Pew., male   DOB: June 17, 1960, 53 y.o.   MRN: [id number]   Patient Active Problem List   Diagnosis Date Noted  . 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 GERD (gastroesophageal reflux disease) 01/14/2013  . Obesity 07/16/2012  . History of tobacco abuse 11/13/2011  . Thyroid nodule 11/13/2011  . Elevated PSA, less than 10 ng/ml 11/13/2011  . Constipation 08/23/2011  . Screening for colon cancer 08/23/2011  . Acquired deviated nasal septum 08/22/2011  . masseter muscle hypertrophy 08/22/2011  . Hyperlipidemia     Subjective:  CC:   Chief Complaint  Patient presents with  . Follow-up    3 months not smoking. Concerned about weight gain since  not smoking.    HPI:   Alexander Campanaro. is a 53 y.o. male who presents for   Follow up on chronic conditions including tobacco abuse, obesity,  Thyroid nodules and parotid gland enlargement.   He has quit smoking and has a result has gained 18 lbs since quitting smoking.  Using chewing gum and sucking on hard candy  Which caused sores  In mouth, which has resolved  Has more energy , watching what he eats  Deit reviewed:  Bfast hardee's suasage and egg biscuit  By 7 am   takens a sandwhich to lunch uses honey wheat or white bread with mayo snakcs :  Apple or orange   Dinner:  Grilled or baked meat. beans and corn, salad ,  White or sweet potato,  No rolls.  Dessert:  No  Saw ENT for parotid gland enlargement causing asymmetry of face.  Masseter muscle hypertrophy found on imaging and there was no parotid gland enlargement .  Thyroid nodule was biopsied and path report was benign but inconclusive due to thick smears.  6 month follow up biopsy was advised by [facility] but patient haas decided not to pursue      Past Medical History  Diagnosis Date  . Hyperlipidemia     Past Surgical History  Procedure Laterality Date  . Prostate biopsy  2012    normal  . Cardiac catheterization  2012    normal,  [place]       The following portions of the patient's history were reviewed and updated as appropriate: Allergies, current medications, and problem list.    Review of Systems:   Patient denies headache, fevers, malaise, unintentional weight loss, skin rash, eye pain, sinus congestion and sinus pain, sore throat, dysphagia,  hemoptysis , cough, dyspnea, wheezing, chest pain, palpitations, orthopnea, edema, abdominal pain, nausea, melena, diarrhea, constipation, flank pain, dysuria, hematuria, urinary  Frequency, nocturia, numbness, tingling, seizures,  Focal weakness, Loss of consciousness,  Tremor, insomnia, depression, anxiety, and suicidal ideation.     History   Social History  . Marital Status: Married    Spouse Name: N/A    Number of Children: N/A  . Years of Education: N/A   Occupational History  . Not on file.   Social History Main Topics  . Smoking status: Former Smoker -- 1.50 packs/day    Types: Cigarettes    Quit date: 08/05/2013  . Smokeless tobacco: Never Used  . Alcohol Use: 0.0 oz/week    0 Not specified per week  . Drug Use: No  . Sexual Activity: Not on file   Other Topics Concern  . Not on file   Social History Narrative    Objective:  Filed Vitals:   11/04/13 1001  BP: 154/86  Pulse:  70  Temp: 98.7 F (37.1 C)  Resp: 16     General appearance: alert, cooperative and appears stated age Ears: normal TM's and external ear canals both ears Throat: lips, mucosa, and tongue normal; teeth and gums normal Neck: no adenopathy, no carotid bruit, supple, symmetrical, trachea midline and thyroid not enlarged, symmetric, no tenderness/mass/nodules Back: symmetric, no curvature. ROM normal. No CVA tenderness. Lungs: clear to auscultation bilaterally Heart: regular rate and rhythm, S1, S2 normal, no murmur, click, rub or gallop Abdomen: soft, non-tender; bowel sounds normal; no masses,  no organomegaly Pulses: 2+ and symmetric Skin: Skin color,  texture, turgor normal. No rashes or lesions Lymph nodes: Cervical, supraclavicular, and axillary nodes normal.  Assessment and Plan:  Thyroid nodule FN was done in march at [facility]  And no malignant cells were seen .  Repeat biopsy was recommended in October due to diffciulty  with slides per report,  (smeears weret too sick)  But patinet has declined depsite my recommendations tdoday   masseter muscle hypertrophy By Ct.  No further imaging needed.  Obesity I have addressed  BMI and recommended wt loss of 10% of body weigh over the next 6 months using a low glycemic index diet and regular exercise a minimum of 5 days per week.     Updated Medication List Outpatient Encounter Prescriptions as of 11/04/2013  Medication Sig  . aspirin 81 MG tablet Take 81 mg by mouth daily.  . cetirizine (ZYRTEC) 10 MG tablet Take 10 mg by mouth daily.  . [facility] NTS STEP 1 21 MG/24HR patch PLACE 1 PATCH ONTO SKIN DAILY  . fluticasone (FLONASE) 50 MCG/ACT nasal spray Place 2 sprays into both nostrils daily.  . Multiple Vitamin (MULTIVITAMIN) tablet Take 1 tablet by mouth daily.  . simvastatin (ZOCOR) 40 MG tablet TAKE 1 TABLET BY MOUTH DAILY AS DIRECTED FOR CHOLESTEROL  . tamsulosin (FLOMAX) 0.4 MG CAPS capsule TAKE 1 CAPSULE (0.4 MG TOTAL) BY MOUTH DAILY.  . [DISCONTINUED] fluticasone (FLONASE) 50 MCG/ACT nasal spray Place 2 sprays into the nose daily.  . [DISCONTINUED] simvastatin (ZOCOR) 40 MG tablet Take 1 tablet by mouth  daily as directed for  cholesterol     Orders Placed This Encounter  Procedures  . Pneumococcal polysaccharide vaccine 23-valent greater than or equal to 2yo subcutaneous/IM  . Fecal Occult Blood, Guaiac  . Comprehensive metabolic panel    No Follow-up on file.

## 2013-11-04 NOTE — Patient Instructions (Signed)
Congratulations on quitting smoking!!  I want to help you to lose weight    Your last fasting glucose indicates you are at risk for developing diabetes, so I am checking an A1c today   I want you to lose 25 lbs over the next six months with a low glycemic index diet and regular exercise (30 minutes of cardio 5 days per week is your goal)  This is  my version of a  "Low GI"  Diet:  It will still lower your blood sugars and allow you to lose 4 to 8  lbs  per month if you follow it carefully.  Your goal with exercise is a minimum of 30 minutes of aerobic exercise 5 days per week (Walking does not count once it becomes easy!)     All of the foods can be found at grocery stores and in bulk at [employer].  The Atkins protein bars and shakes are available in more varieties at Target, [employer] and [place].     7 AM Breakfast:  Choose from the following:  Low carbohydrate Protein  Shakes (I recommend the EAS AdvantEdge "Carb Control" shakes  Or the low carb shakes by Atkins.    2.5 carbs   Arnold's "Sandwhich Thin"toasted  w/ peanut butter (no jelly: about 20 net carbs  "Bagel Thin" with cream cheese and salmon: about 20 carbs   a scrambled egg/bacon/cheese burrito made with Mission's "carb balance" whole wheat tortilla  (about 10 net carbs )  A slice of home made fritatta (egg based dish without a crust:  google it)    Avoid cereal and bananas, oatmeal and cream of wheat and grits. They are loaded with carbohydrates!   10 AM: high protein snack  Protein bar by Atkins (the snack size, under 200 cal, usually < 6 net carbs).    A stick of cheese:  Around 1 carb,  100 cal     Dannon Light n Fit [place] Yogurt  (80 cal, 8 carbs)  Other so called "protein bars" and Greek yogurts tend to be loaded with carbohydrates.  Remember, in food advertising, the word "energy" is synonymous for " carbohydrate."  Lunch:   A Sandwich using the bread choices listed, Can use any  Eggs,  lunchmeat, grilled meat or  canned tuna), avocado, regular mayo/mustard  and cheese.  A Salad using blue cheese, ranch,  Goddess or vinagrette,  No croutons or "confetti" and no "candied nuts" but regular nuts OK.   No pretzels or chips.  Pickles and miniature sweet peppers are a good low carb alternative that provide a "crunch"  The bread is the only source of carbohydrate in a sandwich and  can be decreased by trying some of these alternatives to traditional loaf bread  Joseph's makes a pita bread and a flat bread that are 50 cal and 4 net carbs available at [place] and [place].  This can be toasted to use with hummous as well  Toufayan makes a low carb flatbread that's 100 cal and 9 net carbs available at [employer] and [employer]'s makes 2 sizes of  Low carb whole wheat tortilla  (The large one is 210 cal and 6 net carbs)  Flat Out makes flatbreads that are low carb as well  Avoid "Low fat dressings, as well as Barry Brunner and [place] dressings They are loaded with sugar!   3 PM/ Mid day  Snack:  Consider  1 ounce of  almonds, walnuts, pistachios,  pecans, peanuts,  Macadamia nuts or a nut medley.  Avoid "granola"; the dried cranberries and raisins are loaded with carbohydrates. Mixed nuts as long as there are no raisins,  cranberries or dried fruit.    Try the prosciutto/mozzarella cheese sticks by Fiorruci  In deli /backery section   High protein   To avoid overindulging in snacks: Try drinking a glass of unsweeted almond/coconut milk  Or a cup of coffee with your Atkins chocolate bar to keep you from having 3!!!   Pork rinds!  Yes Pork Rinds        6 PM  Dinner:     Meat/fowl/fish with a green salad, and either broccoli, cauliflower, green beans, spinach, brussel sprouts or  Lima beans. DO NOT BREAD THE PROTEIN!!      There is a low carb pasta by Dreamfield's that is acceptable and tastes great: only 5 digestible carbs/serving.( All grocery stores but BJs carry it )  Try Hurley Cisco Angelo's chicken piccata or chicken  or eggplant parm over low carb pasta.(Lowes and BJs)   Marjory Lies Sanchez's "Carnitas" (pulled pork, no sauce,  0 carbs) or his beef pot roast to make a dinner burrito (at BJ's)  Pesto over low carb pasta (bj's sells a good quality pesto in the center refrigerated section of the deli   Try satueeing  Cheral Marker with mushroooms  Whole wheat pasta is still full of digestible carbs and  Not as low in glycemic index as Dreamfield's.   Brown rice is still rice,  So skip the rice and noodles if you eat [place] or [place] (or at least limit to 1/2 cup)  9 PM snack :   Breyer's "low carb" fudgsicle or  ice cream bar (Carb Smart line), or  Weight Watcher's ice cream bar , or another "no sugar added" ice cream;  a serving of fresh berries/cherries with whipped cream   Cheese or DANNON'S LlGHT N FIT GREEK YOGURT or the Oikos greek yogurt   8 ounces of [place] unsweetened almond/cococunut milk  Cheese and crackers (using WASA crackers,  They are low carb) or peanut butter on low carb crackers or pita bread     Avoid bananas, pineapple, grapes  and watermelon on a regular basis because they are high in sugar.  THINK OF THEM AS DESSERT  Remember that snack Substitutions should be less than 10 NET carbs per serving and meals should be < 25 net carbs. Remember that carbohydrates from fiber do not affect blood sugar, so you can  subtract fiber grams to get the "net carbs " of any particular food item.

## 2013-11-04 NOTE — Progress Notes (Signed)
Pre-visit discussion using our clinic review tool. No additional management support is needed unless otherwise documented below in the visit note.  

## 2013-11-04 NOTE — Progress Notes (Deleted)
>  pcmh

## 2013-11-04 NOTE — Assessment & Plan Note (Signed)
FN was done in march at [place] no malignant cells were seen .  Repeat biopsy was recommended in October due to diffciulty  with slides per report,  (smeears weret too sick)  But patinet has declined depsite my recommendations tdoday

## 2013-11-05 ENCOUNTER — Telehealth: Payer: Self-pay | Admitting: *Deleted

## 2013-11-05 DIAGNOSIS — Z1211 Encounter for screening for malignant neoplasm of colon: Secondary | ICD-10-CM

## 2013-11-05 NOTE — Telephone Encounter (Signed)
-----   Message from [name], MD sent at 10/31/2013  2:15 PM EDT ----- Regarding: RE: Referral I wil make the referral to GI.  Does he have a preference? ----- Message -----    From: Alexander Little    Sent: 10/29/2013   3:11 PM      To: [name], MD Subject: [name]: Referral                                     ----- Message -----    From: Alexander Little    Sent: 10/29/2013   3:03 PM      To: Theda Belfast Subject: Referral                                       Received message from patient requesting to have a colonoscopy scheduled for the being of next year. Please advise patient.  Thank you Melissa

## 2013-11-05 NOTE — Telephone Encounter (Signed)
Called patient and his preference is Dr. Verl Blalock, patient is hoping to get setup for Colonoscopy in January or February  PLease advise.

## 2013-11-05 NOTE — Telephone Encounter (Signed)
Patient notified

## 2013-11-05 NOTE — Telephone Encounter (Signed)
Referral is in process as requested 

## 2013-11-06 ENCOUNTER — Encounter: Payer: Self-pay | Admitting: Internal Medicine

## 2013-11-06 NOTE — Assessment & Plan Note (Signed)
I have addressed  BMI and recommended wt loss of 10% of body weigh over the next 6 months using a low glycemic index diet and regular exercise a minimum of 5 days per week.   

## 2013-11-06 NOTE — Assessment & Plan Note (Signed)
By Ct.  No further imaging needed.

## 2013-11-11 ENCOUNTER — Encounter: Payer: Self-pay | Admitting: Internal Medicine

## 2013-11-11 ENCOUNTER — Other Ambulatory Visit: Payer: Self-pay | Admitting: Internal Medicine

## 2014-01-07 ENCOUNTER — Telehealth: Payer: Self-pay | Admitting: *Deleted

## 2014-01-07 MED ORDER — BENZONATATE 200 MG PO CAPS: 200.0000 mg | ORAL_CAPSULE | Freq: Three times a day (TID) | ORAL | Status: DC | PRN

## 2014-01-07 NOTE — Telephone Encounter (Signed)
I do not call in antibiotics for patient with symptoms that may be caused by a viral infection.    Her Symptoms of  URI suggest that he has a viral infection currently given her current symptoms.   Please explain that in viral URIS, an antibiotic will not help the symptoms and will increase the risk of developing diarrhea. Recommend to  Use Sudafed PE 10 to 30 mg every 8 hours prn for congestion, Ibuprofen 400 mg and tylenol 650 mq 8 hrs for aches and pains,  And generic benadryl 25 mg every 8 hours for nasal drip.   Gargle with salt water for sore throat. I will send rx for tessalon perles for cough. If he develops persistent high  fevers (temp > 101 ear or facial pain, green/brown or blood nasal drainage, I will call him in an antibioitic

## 2014-01-07 NOTE — Telephone Encounter (Signed)
Pt called states he is having cough, congestion and low grade fever.  Further states these are the same symptoms his wife, Livio Ledwith was having.  He is requesting something be called in for him.

## 2014-01-07 NOTE — Telephone Encounter (Signed)
Spoke with pt, advised of MDs message.  Pt verbalized understanding 

## 2014-01-08 ENCOUNTER — Telehealth: Payer: Self-pay | Admitting: Family

## 2014-01-08 DIAGNOSIS — J069 Acute upper respiratory infection, unspecified: Secondary | ICD-10-CM

## 2014-01-08 MED ORDER — AMOXICILLIN-POT CLAVULANATE 875-125 MG PO TABS: 1.0000 | ORAL_TABLET | Freq: Two times a day (BID) | ORAL | Status: DC

## 2014-01-08 NOTE — Progress Notes (Signed)
We are sorry that you are not feeling well.  Here is how we plan to help!  Based on what you have shared with me it looks like you have sinusitis.  Sinusitis is inflammation and infection in the sinus cavities of the head.  Based on your presentation I believe you most likely have Acute Bacterial sinusitis.  This is an infection caused by bacteria and is treated with antibiotics.  I have prescribed Augmentin, an antibiotic in the penicillin family, one tablet twice daily with food, for 7 days.  You may use an oral decongestant such as Mucinex D or if you have glaucoma or high blood pressure use plain Mucinex.  Saline nasal sprays help an can sefely be used as often as needed for congestion.  If you develop worsening sinus pain, fever or notice severe headache and vision changes, or if symptoms are not better after completion of antibiotic, please schedule an appointment with a health care provider.   *Spoke with pt via telephone; he is having sinus drainage with nasal congestion as well, identical in presentation to his wife, whom was a patient of mine via an evisit and responded well to Augmentin as above.   Sinus infections are not as easily transmitted as other respiratory infection, however we still recommend that you avoid close contact with loved ones, especially the very young and elderly.  Remember to wash your hands thoroughly throughout the day as this is the number one way to prevent the spread of infection!  Home Care:  Only take medications as instructed by your medical team.  Complete the entire course of an antibiotic.  Do not take these medications with alcohol.  A steam or ultrasonic humidifier can help congestion.  You can place a towel over your head and breathe in the steam from hot water coming from a faucet.  Avoid close contacts especially the very young and the elderly.  Cover your mouth when you cough or sneeze.  Always remember to wash your hands.  Get Help Right  Away If:  You develop worsening fever or sinus pain.  You develop a severe head ache or visual changes.  Your symptoms persist after you have completed your treatment plan.  Make sure you  Understand these instructions.  Will watch your condition.  Will get help right away if you are not doing well or get worse.  Your e-visit answers were reviewed by a board certified advanced clinical practitioner to complete your personal care plan.  Depending on the condition, your plan could have included both over the counter or prescription medications.  If there is a problem please reply  once you have received a response from your provider.  Your safety is important to [place].  If you have drug allergies check your prescription carefully.    You can use MyChart to ask questions about today's visit, request a non-urgent call back, or ask for a work or school excuse.  You will get an e-mail in the next two days asking about your experience.  I hope that your e-visit has been valuable and will speed your recovery. Thank you for using e-visits.

## 2014-01-15 ENCOUNTER — Ambulatory Visit: Payer: Self-pay | Admitting: Nurse Practitioner

## 2014-01-15 ENCOUNTER — Encounter: Payer: Self-pay | Admitting: Internal Medicine

## 2014-01-15 ENCOUNTER — Ambulatory Visit (INDEPENDENT_AMBULATORY_CARE_PROVIDER_SITE_OTHER): Payer: Commercial Managed Care - PPO | Admitting: Internal Medicine

## 2014-01-15 VITALS — BP 120/72 | HR 68 | Temp 97.8°F | Resp 18 | Ht 69.5 in | Wt 221.8 lb

## 2014-01-15 DIAGNOSIS — E785 Hyperlipidemia, unspecified: Secondary | ICD-10-CM

## 2014-01-15 DIAGNOSIS — J0101 Acute recurrent maxillary sinusitis: Secondary | ICD-10-CM | POA: Diagnosis not present

## 2014-01-15 DIAGNOSIS — J0110 Acute frontal sinusitis, unspecified: Secondary | ICD-10-CM

## 2014-01-15 LAB — LIPID PANEL
CHOL/HDL RATIO: 5
Cholesterol: 174 mg/dL (ref 0–200)
HDL: 37.7 mg/dL — AB (ref >39.00)
NonHDL: 136.3
Triglycerides: 225 mg/dL — ABNORMAL HIGH (ref 0.0–149.0)
VLDL: 45 mg/dL — AB (ref 0.0–40.0)

## 2014-01-15 LAB — CBC WITH DIFFERENTIAL/PLATELET
BASOS PCT: 0.2 % (ref 0.0–3.0)
Basophils Absolute: 0 10*3/uL (ref 0.0–0.1)
EOS PCT: 2.6 % (ref 0.0–5.0)
Eosinophils Absolute: 0.2 10*3/uL (ref 0.0–0.7)
HCT: 40.7 % (ref 39.0–52.0)
Hemoglobin: 13.5 g/dL (ref 13.0–17.0)
Lymphocytes Relative: 37.3 % (ref 12.0–46.0)
Lymphs Abs: 2.4 10*3/uL (ref 0.7–4.0)
MCHC: 33.1 g/dL (ref 30.0–36.0)
MCV: 89.8 fl (ref 78.0–100.0)
MONO ABS: 0.6 10*3/uL (ref 0.1–1.0)
MONOS PCT: 8.7 % (ref 3.0–12.0)
Neutro Abs: 3.3 10*3/uL (ref 1.4–7.7)
Neutrophils Relative %: 51.2 % (ref 43.0–77.0)
PLATELETS: 182 10*3/uL (ref 150.0–400.0)
RBC: 4.53 Mil/uL (ref 4.22–5.81)
RDW: 15.6 % — ABNORMAL HIGH (ref 11.5–15.5)
WBC: 6.4 10*3/uL (ref 4.0–10.5)

## 2014-01-15 LAB — LDL CHOLESTEROL, DIRECT: Direct LDL: 103 mg/dL

## 2014-01-15 MED ORDER — METHYLPREDNISOLONE ACETATE 40 MG/ML IJ SUSP
40.0000 mg | Freq: Once | INTRAMUSCULAR | Status: AC
Start: 2014-01-15 — End: 2014-01-15
  Administered 2014-01-15: 40 mg via INTRAMUSCULAR

## 2014-01-15 NOTE — Patient Instructions (Signed)
I am continuing your treatment for sinusitis   I am prescribing a a prednisone taper  To manage the i the inflammation in your ear/sinuses.  Finish the augmenint  I also advise use of the following OTC meds to help with your other symptoms.   Take generic OTC benadryl 25 mg  In the evening for the drainage,  Sudafed PE  10 to 30 mg every 6 hours for the congestion, you may substitute Afrin nasal spray for the nighttime dose of sudafed PE  If needed to prevent insomnia.  flush your sinuses twice daily with  Neil's sinus rinse e (do over the sink because if you do it right you will spit out globs of mucus)  Please take a probiotic ( Align, Floraque or Culturelle) for 2 weeks to prevent a serious antibiotic associated diarrhea  Called" clostridium dificile colitis"

## 2014-01-15 NOTE — Progress Notes (Signed)
Pre-visit discussion using our clinic review tool. No additional management support is needed unless otherwise documented below in the visit note.  

## 2014-01-15 NOTE — Progress Notes (Signed)
Patient ID: Alexander Pew., male   DOB: [birth year], 54 y.o.   MRN: [id number]   Patient Active Problem List   Diagnosis Date Noted  . Sinusitis, acute frontal 01/17/2014  . 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 GERD (gastroesophageal reflux disease) 01/14/2013  . Obesity 07/16/2012  . History of tobacco abuse 11/13/2011  . Thyroid nodule 11/13/2011  . Elevated PSA, less than 10 ng/ml 11/13/2011  . Constipation 08/23/2011  . Screening for colon cancer 08/23/2011  . Acquired deviated nasal septum 08/22/2011  . masseter muscle hypertrophy 08/22/2011  . Hyperlipidemia     Subjective:  CC:   Chief Complaint  Patient presents with  . Acute Visit    Nasal,chest congestion, did E-Visit on line was prescribed augmentin.  Marland Kitchen Cough    some cough with green mucus, afebrile    HPI:   Alexander Dahms. is a 54 y.o. male who presents for follow up on hyperlipidemia and recently diagnosed upper respiratory infection. he started antibiotics last thursday via E visit.   Went to work on Monday and  Tuesday but started feeling bad again due to sinus pressure and headache along with chest congestion,  Cough is producitive of green sputum but not frequent.  Has not missed any doses of augmentin .  Not flushing sinuses or  Taking any oral or topical decongestants but using Flonase. Has been having diarrhea since he started the abx ,  Just one  loose stool not occurring daily.  He is not  taking a probiotic    Past Medical History  Diagnosis Date  . Hyperlipidemia     Past Surgical History  Procedure Laterality Date  . Prostate biopsy  2012    normal  . Cardiac catheterization  2012    normal, [place]       The following portions of the patient's history were reviewed and updated as appropriate: Allergies, current medications, and problem list.    Review of Systems:   Patient denies headache, fevers, malaise, unintentional weight loss, skin rash, eye pain, sinus congestion and  sinus pain, sore throat, dysphagia,  hemoptysis , cough, dyspnea, wheezing, chest pain, palpitations, orthopnea, edema, abdominal pain, nausea, melena, diarrhea, constipation, flank pain, dysuria, hematuria, urinary  Frequency, nocturia, numbness, tingling, seizures,  Focal weakness, Loss of consciousness,  Tremor, insomnia, depression, anxiety, and suicidal ideation.     History   Social History  . Marital Status: Married    Spouse Name: N/A    Number of Children: N/A  . Years of Education: N/A   Occupational History  . Not on file.   Social History Main Topics  . Smoking status: Former Smoker -- 1.50 packs/day    Types: Cigarettes    Quit date: 08/05/2013  . Smokeless tobacco: Never Used  . Alcohol Use: 0.0 oz/week    0 Not specified per week  . Drug Use: No  . Sexual Activity: Not on file   Other Topics Concern  . Not on file   Social History Narrative    Objective:  Filed Vitals:   01/15/14 0945  BP: 120/72  Pulse: 68  Temp: 97.8 F (36.6 C)  Resp: 18     General appearance: alert, cooperative and appears stated age Ears: normal TM's and external ear canals both ears Face: frontal sinus tenderness,  Throat: lips, mucosa, and tongue normal; teeth and gums normal Neck: no adenopathy, no carotid bruit, supple, symmetrical, trachea midline and thyroid not enlarged, symmetric, no tenderness/mass/nodules Back: symmetric,  no curvature. ROM normal. No CVA tenderness. Lungs: clear to auscultation bilaterally Heart: regular rate and rhythm, S1, S2 normal, no murmur, click, rub or gallop Abdomen: soft, non-tender; bowel sounds normal; no masses,  no organomegaly Pulses: 2+ and symmetric Skin: Skin color, texture, turgor normal. No rashes or lesions Lymph nodes: Cervical, supraclavicular, and axillary nodes normal.  Assessment and Plan:  Problem List Items Addressed This Visit    Hyperlipidemia    Continue simvastatin 40 mg daily for elevated 10 yr risk of  CAD  based on Framingham calculation  .diet and exercise rec's given for elevated triglycerides.   Lab Results  Component Value Date   CHOL 174 01/15/2014   HDL 37.70* 01/15/2014   LDLCALC 100* 01/14/2013   LDLDIRECT 103.0 01/15/2014   TRIG 225.0* 01/15/2014   CHOLHDL 5 01/15/2014          Relevant Orders   Lipid panel (Completed)   LDL cholesterol, direct (Completed)   Sinusitis, acute frontal    Adding prednisone and oral decongestant and saline flushes.  Advised to start a probiotic       Relevant Medications   methylPREDNISolone acetate (DEPO-MEDROL) injection 40 mg (Completed)   predniSONE (STERAPRED UNI-PAK) 10 MG tablet    Other Visit Diagnoses    Acute recurrent maxillary sinusitis    -  Primary    Relevant Medications    methylPREDNISolone acetate (DEPO-MEDROL) injection 40 mg (Completed)    predniSONE (STERAPRED UNI-PAK) 10 MG tablet    Other Relevant Orders    CBC with Differential (Completed)

## 2014-01-16 ENCOUNTER — Encounter: Payer: Self-pay | Admitting: Internal Medicine

## 2014-01-17 DIAGNOSIS — J0110 Acute frontal sinusitis, unspecified: Secondary | ICD-10-CM | POA: Insufficient documentation

## 2014-01-17 MED ORDER — PREDNISONE (PAK) 10 MG PO TABS: ORAL_TABLET | ORAL | Status: DC

## 2014-01-17 NOTE — Assessment & Plan Note (Signed)
Adding prednisone and oral decongestant and saline flushes.  Advised to start a probiotic

## 2014-01-17 NOTE — Assessment & Plan Note (Signed)
Continue simvastatin 40 mg daily for elevated 10 yr risk of  CAD based on Framingham calculation  .diet and exercise rec's given for elevated triglycerides.   Lab Results  Component Value Date   CHOL 174 01/15/2014   HDL 37.70* 01/15/2014   LDLCALC 100* 01/14/2013   LDLDIRECT 103.0 01/15/2014   TRIG 225.0* 01/15/2014   CHOLHDL 5 01/15/2014

## 2014-02-03 ENCOUNTER — Other Ambulatory Visit: Payer: Self-pay | Admitting: Internal Medicine

## 2014-02-20 ENCOUNTER — Ambulatory Visit: Payer: Self-pay | Admitting: Gastroenterology

## 2014-03-26 NOTE — Telephone Encounter (Signed)
Mailed unread message to pt, requested call to schedule lab appt for July

## 2014-04-28 LAB — SURGICAL PATHOLOGY

## 2014-05-05 ENCOUNTER — Other Ambulatory Visit: Payer: Self-pay | Admitting: *Deleted

## 2014-05-05 MED ORDER — TAMSULOSIN HCL 0.4 MG PO CAPS: ORAL_CAPSULE | ORAL | Status: DC

## 2014-05-05 MED ORDER — FLUTICASONE PROPIONATE 50 MCG/ACT NA SUSP: 2.0000 | Freq: Every day | NASAL | Status: DC

## 2014-05-05 MED ORDER — SIMVASTATIN 40 MG PO TABS: ORAL_TABLET | ORAL | Status: DC

## 2014-09-14 ENCOUNTER — Other Ambulatory Visit: Payer: Self-pay | Admitting: Internal Medicine

## 2014-11-13 ENCOUNTER — Other Ambulatory Visit: Payer: Self-pay

## 2014-11-13 ENCOUNTER — Telehealth: Payer: Self-pay | Admitting: *Deleted

## 2014-11-13 MED ORDER — TAMSULOSIN HCL 0.4 MG PO CAPS: 0.4000 mg | ORAL_CAPSULE | Freq: Every day | ORAL | Status: DC

## 2014-11-13 MED ORDER — FLUTICASONE PROPIONATE 50 MCG/ACT NA SUSP: 2.0000 | Freq: Every day | NASAL | Status: DC

## 2014-11-13 MED ORDER — SIMVASTATIN 40 MG PO TABS: 40.0000 mg | ORAL_TABLET | Freq: Every day | ORAL | Status: DC

## 2014-11-13 NOTE — Telephone Encounter (Signed)
Patient has requested a medication refill for Flomax and simvastatin . Please use [facility] that's listed.

## 2014-11-13 NOTE — Telephone Encounter (Signed)
Medicine have been called in

## 2015-02-06 ENCOUNTER — Other Ambulatory Visit: Payer: Self-pay | Admitting: Internal Medicine

## 2015-03-23 ENCOUNTER — Ambulatory Visit
Admission: EM | Admit: 2015-03-23 | Discharge: 2015-03-23 | Disposition: A | Discharge status: Home / Self Care | Payer: BLUE CROSS/BLUE SHIELD | Attending: Emergency Medicine | Admitting: Emergency Medicine

## 2015-03-23 ENCOUNTER — Encounter: Payer: Self-pay | Admitting: Emergency Medicine

## 2015-03-23 DIAGNOSIS — J029 Acute pharyngitis, unspecified: Secondary | ICD-10-CM

## 2015-03-23 LAB — RAPID INFLUENZA A&B ANTIGENS
Influenza A (ARMC): NEGATIVE
Influenza B (ARMC): NEGATIVE

## 2015-03-23 LAB — RAPID STREP SCREEN (MED CTR MEBANE ONLY): STREPTOCOCCUS, GROUP A SCREEN (DIRECT): NEGATIVE

## 2015-03-23 MED ORDER — CETIRIZINE-PSEUDOEPHEDRINE ER 5-120 MG PO TB12: 1.0000 | ORAL_TABLET | Freq: Two times a day (BID) | ORAL | Status: DC

## 2015-03-23 MED ORDER — IBUPROFEN 800 MG PO TABS: 800.0000 mg | ORAL_TABLET | Freq: Three times a day (TID) | ORAL | Status: DC

## 2015-03-23 NOTE — Discharge Instructions (Signed)
your rapid strep was negative today, so we have sent off a throat culture.  We will contact you and call in the appropriate antibiotics if your culture comes back positive for an infection requiring antibiotic treatment.  Give [place] a working phone number.  If you were given a prescription for antibiotics, you may want to wait and fill it until you know the results of the culture.  Tylenol and ibuprofen together as needed for pain.  Make sure you drink plenty of extra fluids.  Some people find salt water gargles and  Traditional Medicinal's "Throat Coat" tea helpful. Take 5 mL of liquid Benadryl and 5 mL of Maalox. Mix it together, and then hold it in your mouth for as long as you can and then swallow. You may do this 4 times a day.    Go to www.goodrx.com to look up your medications. This will give you a list of where you can find your prescriptions at the most affordable prices.  Continue Flonase, saline nasal irrigation. Stop the Zyrtec, start Zyrtec D.

## 2015-03-23 NOTE — ED Notes (Signed)
Patient states he started aching all over last night, throat is scratchy.

## 2015-03-23 NOTE — ED Provider Notes (Signed)
HPI  SUBJECTIVE:  Patient reports sore throat starting last night. Sx worse with swallowing.  Sx better with nothing. Has been taking Zyrtec, DayQuil, cough drops w/ o relief. Has also been doing Flonase, saline nasal irrigation. No Fever    No URI sx + Myalgias No Headache No Rash     No Recent Strep Exposure No Abdominal Pain No reflux sxs + Allergy sxs- reports sneezing, nasal congestion, postnasal drip, mild cough. No itchy, watery eyes, sinus pain/pressure, wheezing, chest pain, shortness of breath. No foreign body sensation in his throat  No fatigue, malaise No Breathing difficulty, voice changes No Drooling No Trismus  + antipyretic in past 4-6 hrs  Patient states that he has had similar symptoms when his allergies "get bad". States that they tend to bother him around this time of year.    Past Medical History  Diagnosis Date  . Hyperlipidemia     Past Surgical History  Procedure Laterality Date  . Prostate biopsy  2012    normal  . Cardiac catheterization  2012    normal, [place]    Family History  Problem Relation Age of Onset  . Stroke Mother   . Heart disease Mother   . Mental illness Mother   . COPD Mother   . Cancer Father 30    brain ca  . COPD Father   . Early death Father     Social History  Substance Use Topics  . Smoking status: Former Smoker -- 1.50 packs/day    Types: Cigarettes    Quit date: 08/05/2013  . Smokeless tobacco: Never Used  . Alcohol Use: 0.0 oz/week    0 Standard drinks or equivalent per week    No current facility-administered medications for this encounter.  Current outpatient prescriptions:  .  aspirin 81 MG tablet, Take 81 mg by mouth daily., Disp: , Rfl:  .  [facility] NTS STEP 1 21 MG/24HR patch, PLACE 1 PATCH ONTO SKIN DAILY, Disp: 28 patch, Rfl: 3 .  fluticasone (FLONASE) 50 MCG/ACT nasal spray, Place 2 sprays into both nostrils daily., Disp: 48 g, Rfl: 1 .  Multiple Vitamin (MULTIVITAMIN) tablet, Take 1 tablet by  mouth daily., Disp: , Rfl:  .  simvastatin (ZOCOR) 40 MG tablet, TAKE 1 TABLET (40 MG TOTAL) BY MOUTH DAILY AT 6 PM., Disp: 90 tablet, Rfl: 0 .  tamsulosin (FLOMAX) 0.4 MG CAPS capsule, TAKE 1 CAPSULE (0.4 MG TOTAL) BY MOUTH DAILY., Disp: 90 capsule, Rfl: 0 .  [DISCONTINUED] cetirizine (ZYRTEC) 10 MG tablet, Take 10 mg by mouth daily., Disp: , Rfl:  .  cetirizine-pseudoephedrine (ZYRTEC-D) 5-120 MG tablet, Take 1 tablet by mouth 2 (two) times daily., Disp: 30 tablet, Rfl: 0 .  ibuprofen (ADVIL,MOTRIN) 800 MG tablet, Take 1 tablet (800 mg total) by mouth 3 (three) times daily., Disp: 30 tablet, Rfl: 0  Allergies  Allergen Reactions  . Sulfa Antibiotics Rash     ROS  As noted in HPI.   Physical Exam  BP 130/62 mmHg  Pulse 63  Temp(Src) 97.9 F (36.6 C) (Oral)  Resp 18  Ht 5\' 10"  (1.778 m)  Wt 205 lb (92.987 kg)  BMI 29.41 kg/m2  SpO2 99%  Constitutional: Well developed, well nourished, no acute distress Eyes:  EOMI, conjunctiva normal bilaterally HENT: Normocephalic, atraumatic,mucus membranes moist. +  nasal congestion No sinus tenderness +  erythematous oropharynx  - enlarged tonsils  - exudates. Uvula hyperemic, enlarged, but nontender. Uvula midline  Respiratory: Normal inspiratory effort Cardiovascular: Normal  rate, no murmurs, rubs, gallops GI: nondistended, nontender. No appreciable splenomegaly skin: No rash, skin intact Lymph: - cervical LN  Musculoskeletal: no deformities Neurologic: Alert & oriented x 3, no focal neuro deficits Psychiatric: Speech and behavior appropriate. At baseline mental status per caregiver.   ED Course   Medications - No data to display  Orders Placed This Encounter  Procedures  . Rapid Influenza A&B Antigens ([facility] only)    Standing Status: Standing     Number of Occurrences: 1     Standing Expiration Date:   . Rapid strep screen    Standing Status: Standing     Number of Occurrences: 1     Standing Expiration Date:     Order  Specific Question:  Patient immune status    Answer:  Normal  . Culture, group A strep    Standing Status: Standing     Number of Occurrences: 1     Standing Expiration Date:   . Droplet precaution    Standing Status: Standing     Number of Occurrences: 1     Standing Expiration Date:     Results for orders placed or performed during the hospital encounter of 03/23/15 (from the past 24 hour(s))  Rapid Influenza A&B Antigens ([facility] only)     Status: None   Collection Time: 03/23/15  3:28 PM  Result Value Ref Range   Influenza A ([facility]) NEGATIVE NEGATIVE   Influenza B ([facility]) NEGATIVE NEGATIVE  Rapid strep screen     Status: None   Collection Time: 03/23/15  4:10 PM  Result Value Ref Range   Streptococcus, Group A Screen (Direct) NEGATIVE NEGATIVE   No results found.  ED Clinical Impression  Pharyngitis  ED Assessment/Plan  Flu negative. does not appear to be flu. More likely allergy exacerbation. Also in the differential is uvulitis, which is why the strep test was done. This is negative. We'll send this off for culture. Plan to send home with ZyrtecD, discontinue regular Zyrtec,, ibuprofen 800 mg, Benadryl/Maalox mixture, continue saline nasal irrigation, Flonase.  Discussed labs, MDM, plan and followup with patient. Discussed sn/sx that should prompt return to the UC or ED. Patient  agrees with plan.   *This clinic note was created using Dragon dictation software. Therefore, there may be occasional mistakes despite careful proofreading.    Melynda Ripple, MD 03/23/15 1655

## 2015-03-25 LAB — CULTURE, GROUP A STREP (THRC)

## 2015-03-27 ENCOUNTER — Encounter: Payer: Self-pay | Admitting: Family Medicine

## 2015-03-27 ENCOUNTER — Ambulatory Visit (INDEPENDENT_AMBULATORY_CARE_PROVIDER_SITE_OTHER): Payer: BLUE CROSS/BLUE SHIELD | Admitting: Family Medicine

## 2015-03-27 DIAGNOSIS — J111 Influenza due to unidentified influenza virus with other respiratory manifestations: Secondary | ICD-10-CM | POA: Diagnosis not present

## 2015-03-27 LAB — POCT INFLUENZA A/B
Influenza A, POC: POSITIVE — AB
Influenza B, POC: POSITIVE — AB

## 2015-03-27 NOTE — Progress Notes (Signed)
Subjective:  Patient ID: Alexander Little., male    DOB: 10-Jul-1960  Age: 55 y.o. MRN: [id number]  CC: Flu like symptoms  HPI:  55 year old male presents to the clinic today with flu like symptoms.  Patient states he's been sick since Monday. He's been experiencing fever, chills, body aches, cough, head/nasal congestion. He was recently seen local urgent care and was diagnosed with pharyngitis. His flu test was negative at that time. He continues to have the above symptoms. He's had no improvement in his symptoms despite conservative care at home. He's been using Zyrtec and ibuprofen with no relief. No known exacerbating factors. No other complaints at this time.  Social Hx   Social History   Social History  . Marital Status: Married    Spouse Name: N/A  . Number of Children: N/A  . Years of Education: N/A   Social History Main Topics  . Smoking status: Former Smoker -- 1.50 packs/day    Types: Cigarettes    Quit date: 08/05/2013  . Smokeless tobacco: Never Used  . Alcohol Use: 0.0 oz/week    0 Standard drinks or equivalent per week  . Drug Use: No  . Sexual Activity: Not Asked   Other Topics Concern  . None   Social History Narrative   Review of Systems  Constitutional: Positive for fever and chills.  HENT: Positive for congestion and sore throat.   Respiratory: Positive for cough.   Musculoskeletal: Positive for myalgias.   Objective:  BP 120/82 mmHg  Pulse 66  Temp(Src) 98.7 F (37.1 C) (Oral)  Ht 5' 9.5" (1.765 m)  Wt 205 lb 8 oz (93.214 kg)  BMI 29.92 kg/m2  SpO2 98%  BP/Weight 03/27/2015 03/23/2015 [id number]  Systolic BP [id number]  Diastolic BP 82 62 72  Wt. (Lbs) 205.5 205 221.75  BMI 29.92 29.41 32.29   Physical Exam  Constitutional: He is oriented to person, place, and time.  Appears that he does not feel well. NAD.   HENT:  Head: Normocephalic and atraumatic.  Oropharynx with moderate erythema. No exudate. Normal TMs bilaterally.    Cardiovascular: Normal rate and regular rhythm.   Murmur heard.  Systolic murmur is present with a grade of 2/6  Pulmonary/Chest: Effort normal and breath sounds normal. No respiratory distress. He has no wheezes. He has no rales.  Neurological: He is alert and oriented to person, place, and time.  Vitals reviewed.  Lab Results  Component Value Date   WBC 6.4 01/15/2014   HGB 13.5 01/15/2014   HCT 40.7 01/15/2014   PLT 182.0 01/15/2014   GLUCOSE 90 11/04/2013   CHOL 174 01/15/2014   TRIG 225.0* 01/15/2014   HDL 37.70* 01/15/2014   LDLDIRECT 103.0 01/15/2014   LDLCALC 100* 01/14/2013   ALT 26 11/04/2013   AST 30 11/04/2013   NA 136 11/04/2013   K 4.5 11/04/2013   CL 103 11/04/2013   CREATININE 1.0 11/04/2013   BUN 11 11/04/2013   CO2 23 11/04/2013    Assessment & Plan:   Problem List Items Addressed This Visit    Influenza    New problem. Rapid flu positive today. Patient outside of the treatment window and does not any high risk conditions/comorbidities or complications from illness to warrant treatment with antiviral. Advised supportive care - Rest, fluids, Ibuprofen, OTC antihistamines.      Relevant Orders   POCT Influenza A/B (Completed)     Follow-up: PRN  Thersa Salt DO [facility]  [place]

## 2015-03-27 NOTE — Patient Instructions (Signed)
You have the flu.  You are outside of the treatment window.  Continue supportive care.  Use OTC tylenol/ibuprofen. Antihistamines may help.  Take care  Dr. Lacinda Axon

## 2015-03-27 NOTE — Assessment & Plan Note (Signed)
New problem. Rapid flu positive today. Patient outside of the treatment window and does not any high risk conditions/comorbidities or complications from illness to warrant treatment with antiviral. Advised supportive care - Rest, fluids, Ibuprofen, OTC antihistamines.

## 2015-03-27 NOTE — Progress Notes (Signed)
Pre visit review using our clinic review tool, if applicable. No additional management support is needed unless otherwise documented below in the visit note. 

## 2015-04-03 ENCOUNTER — Ambulatory Visit (INDEPENDENT_AMBULATORY_CARE_PROVIDER_SITE_OTHER): Payer: BLUE CROSS/BLUE SHIELD | Admitting: Internal Medicine

## 2015-04-03 ENCOUNTER — Other Ambulatory Visit: Payer: Self-pay | Admitting: *Deleted

## 2015-04-03 ENCOUNTER — Encounter: Payer: Self-pay | Admitting: Internal Medicine

## 2015-04-03 ENCOUNTER — Ambulatory Visit
Admission: RE | Admit: 2015-04-03 | Discharge: 2015-04-03 | Disposition: A | Discharge status: Home / Self Care | Payer: BLUE CROSS/BLUE SHIELD | Source: Ambulatory Visit | Attending: Internal Medicine | Admitting: Internal Medicine

## 2015-04-03 VITALS — BP 129/70 | HR 70 | Temp 98.5°F | Ht 69.5 in | Wt 211.0 lb

## 2015-04-03 DIAGNOSIS — R197 Diarrhea, unspecified: Secondary | ICD-10-CM

## 2015-04-03 DIAGNOSIS — J9811 Atelectasis: Secondary | ICD-10-CM | POA: Diagnosis not present

## 2015-04-03 DIAGNOSIS — R059 Cough, unspecified: Secondary | ICD-10-CM

## 2015-04-03 DIAGNOSIS — R05 Cough: Secondary | ICD-10-CM

## 2015-04-03 MED ORDER — AMOXICILLIN-POT CLAVULANATE 875-125 MG PO TABS: 1.0000 | ORAL_TABLET | Freq: Two times a day (BID) | ORAL | Status: DC

## 2015-04-03 NOTE — Progress Notes (Signed)
Subjective:    Patient ID: Alexander Little., male    DOB: 01/17/1960, 55 y.o.   MRN: [id number]  HPI  55YO male presents for acute visit.  Recently seen for Influenza 3/24.  Now having some diarrhea, described as watery. No NV. No blood in stool. No abdominal pain except for cramping with diarrhea. Also having chest congestion. Feels short of breath at times. Cough occasionally of some mucous, yellow. No fever. Taking occasional Tylenol.  Wt Readings from Last 3 Encounters:  04/03/15 211 lb (95.709 kg)  03/27/15 205 lb 8 oz (93.214 kg)  03/23/15 205 lb (92.987 kg)   BP Readings from Last 3 Encounters:  04/03/15 129/70  03/27/15 120/82  03/23/15 130/62    Past Medical History  Diagnosis Date  . Hyperlipidemia    Family History  Problem Relation Age of Onset  . Stroke Mother   . Heart disease Mother   . Mental illness Mother   . COPD Mother   . Cancer Father 15    brain ca  . COPD Father   . Early death Father    Past Surgical History  Procedure Laterality Date  . Prostate biopsy  2012    normal  . Cardiac catheterization  2012    normal, [place]   Social History   Social History  . Marital Status: Married    Spouse Name: N/A  . Number of Children: N/A  . Years of Education: N/A   Social History Main Topics  . Smoking status: Former Smoker -- 1.50 packs/day    Types: Cigarettes    Quit date: 08/05/2013  . Smokeless tobacco: Never Used  . Alcohol Use: 0.0 oz/week    0 Standard drinks or equivalent per week  . Drug Use: No  . Sexual Activity: Not Asked   Other Topics Concern  . None   Social History Narrative    Review of Systems  Constitutional: Positive for fatigue. Negative for fever, chills, activity change, appetite change and unexpected weight change.  HENT: Positive for congestion, postnasal drip and rhinorrhea.   Eyes: Negative for visual disturbance.  Respiratory: Positive for cough and shortness of breath.   Cardiovascular:  Negative for chest pain, palpitations and leg swelling.  Gastrointestinal: Positive for diarrhea. Negative for nausea, vomiting, abdominal pain, blood in stool and abdominal distention.  Genitourinary: Negative for dysuria, urgency and difficulty urinating.  Musculoskeletal: Negative for myalgias, arthralgias and gait problem.  Skin: Negative for color change and rash.  Hematological: Negative for adenopathy.  Psychiatric/Behavioral: Negative for sleep disturbance and dysphoric mood. The patient is not nervous/anxious.        Objective:    BP 129/70 mmHg  Pulse 70  Temp(Src) 98.5 F (36.9 C) (Oral)  Ht 5' 9.5" (1.765 m)  Wt 211 lb (95.709 kg)  BMI 30.72 kg/m2  SpO2 99% Physical Exam  Constitutional: He is oriented to person, place, and time. He appears well-developed and well-nourished. No distress.  HENT:  Head: Normocephalic and atraumatic.  Right Ear: External ear normal.  Left Ear: External ear normal.  Nose: Nose normal.  Mouth/Throat: Oropharynx is clear and moist. No oropharyngeal exudate.  Eyes: Conjunctivae and EOM are normal. Pupils are equal, round, and reactive to light. Right eye exhibits no discharge. Left eye exhibits no discharge. No scleral icterus.  Neck: Normal range of motion. Neck supple. No tracheal deviation present. No thyromegaly present.  Cardiovascular: Normal rate, regular rhythm and normal heart sounds.  Exam reveals no gallop and  no friction rub.   No murmur heard. Pulmonary/Chest: Effort normal and breath sounds normal. No accessory muscle usage. No tachypnea. No respiratory distress. He has no decreased breath sounds. He has no wheezes. He has no rhonchi. He has no rales. He exhibits no tenderness.  Abdominal: Soft. Bowel sounds are normal. He exhibits no distension and no mass. There is no tenderness. There is no rebound and no guarding.  Musculoskeletal: Normal range of motion. He exhibits no edema.  Lymphadenopathy:    He has no cervical  adenopathy.  Neurological: He is alert and oriented to person, place, and time. No cranial nerve deficit. Coordination normal.  Skin: Skin is warm and dry. No rash noted. He is not diaphoretic. No erythema. No pallor.  Psychiatric: He has a normal mood and affect. His behavior is normal. Judgment and thought content normal.          Assessment & Plan:   Problem List Items Addressed This Visit      Unprioritized   Cough - Primary    Persistent cough, likely secondary to recent influenza, however will get CXR for evaluation. Continue prn antihistamines. Follow up 1 week and prn.      Relevant Orders   DG Chest 2 View   Diarrhea    Recent diarrhea likely related to influenza. Exam normal. Encouraged fluid intake, bland diet. Imodium prn. Follow up if symptoms not improving.          Return in about 1 week (around 04/10/2015) for Recheck.  Ronette Deter, MD Internal [facility]

## 2015-04-03 NOTE — Assessment & Plan Note (Signed)
Persistent cough, likely secondary to recent influenza, however will get CXR for evaluation. Continue prn antihistamines. Follow up 1 week and prn.

## 2015-04-03 NOTE — Assessment & Plan Note (Signed)
Recent diarrhea likely related to influenza. Exam normal. Encouraged fluid intake, bland diet. Imodium prn. Follow up if symptoms not improving.

## 2015-04-03 NOTE — Patient Instructions (Signed)
Increase fluid intake. Eat bland foods. Avoid dairy.  Use Imodium if diarrhea persists.  Chest xray today at [facility].

## 2015-04-03 NOTE — Progress Notes (Signed)
Pre visit review using our clinic review tool, if applicable. No additional management support is needed unless otherwise documented below in the visit note. 

## 2015-04-14 ENCOUNTER — Ambulatory Visit (INDEPENDENT_AMBULATORY_CARE_PROVIDER_SITE_OTHER): Payer: BLUE CROSS/BLUE SHIELD | Admitting: Internal Medicine

## 2015-04-14 ENCOUNTER — Ambulatory Visit
Admission: RE | Admit: 2015-04-14 | Discharge: 2015-04-14 | Disposition: A | Discharge status: Home / Self Care | Payer: BLUE CROSS/BLUE SHIELD | Source: Ambulatory Visit | Attending: Internal Medicine | Admitting: Internal Medicine

## 2015-04-14 ENCOUNTER — Telehealth: Payer: Self-pay | Admitting: *Deleted

## 2015-04-14 ENCOUNTER — Encounter: Payer: Self-pay | Admitting: Internal Medicine

## 2015-04-14 VITALS — BP 108/62 | HR 68 | Temp 97.8°F | Wt 211.0 lb

## 2015-04-14 DIAGNOSIS — J189 Pneumonia, unspecified organism: Secondary | ICD-10-CM | POA: Insufficient documentation

## 2015-04-14 DIAGNOSIS — J301 Allergic rhinitis due to pollen: Secondary | ICD-10-CM

## 2015-04-14 MED ORDER — MONTELUKAST SODIUM 10 MG PO TABS: 10.0000 mg | ORAL_TABLET | Freq: Every day | ORAL | Status: DC

## 2015-04-14 NOTE — Telephone Encounter (Signed)
Left message on machine at home that we do not those CXR results yet but as soon as we get them we will call him.

## 2015-04-14 NOTE — Telephone Encounter (Signed)
Patient has requested his Xray results from 04/14/15 Contact [phone]

## 2015-04-14 NOTE — Assessment & Plan Note (Addendum)
Reviewed recent CXR showing RML pneumonia. Completed augmentin.Will repeat CXR today. Follow up prn.

## 2015-04-14 NOTE — Patient Instructions (Signed)
Chest xray today.  Start Singulair 10mg  daily to help with allergy symptoms.  Try changing to Allegra during the day to help with allergies.

## 2015-04-14 NOTE — Progress Notes (Signed)
Pre visit review using our clinic review tool, if applicable. No additional management support is needed unless otherwise documented below in the visit note. 

## 2015-04-14 NOTE — Assessment & Plan Note (Signed)
Discussed some interventions to help with allergic rhinitis. He will try changing antihistamine to Allegra. Will add Singulair and continue Flonase. Follow up prn if symptoms not improving.

## 2015-04-14 NOTE — Progress Notes (Signed)
Subjective:    Patient ID: Alexander Little., male    DOB: January 24, 1960, 55 y.o.   MRN: [id number]  HPI  55YO male presents for follow up.  Recently seen for cough after influenza. CXR showed right middle lobe pneumonia. Treated with Augmentin.  Cough has improved. Occasional nasal drainage. No dyspnea. No fever. Completed Augmentin. No side effects noted.  Continues to have sneezing, watery eyes, and clear nasal drainage. Consistent with h/o allergic rhinitis. Taking Zyrtec with no improvement. Using Flonase with no improvement.  Wt Readings from Last 3 Encounters:  04/14/15 211 lb (95.709 kg)  04/03/15 211 lb (95.709 kg)  03/27/15 205 lb 8 oz (93.214 kg)   BP Readings from Last 3 Encounters:  04/14/15 108/62  04/03/15 129/70  03/27/15 120/82    Past Medical History  Diagnosis Date  . Hyperlipidemia    Family History  Problem Relation Age of Onset  . Stroke Mother   . Heart disease Mother   . Mental illness Mother   . COPD Mother   . Cancer Father 39    brain ca  . COPD Father   . Early death Father    Past Surgical History  Procedure Laterality Date  . Prostate biopsy  2012    normal  . Cardiac catheterization  2012    normal, [place]   Social History   Social History  . Marital Status: Married    Spouse Name: N/A  . Number of Children: N/A  . Years of Education: N/A   Social History Main Topics  . Smoking status: Former Smoker -- 1.50 packs/day    Types: Cigarettes    Quit date: 08/05/2013  . Smokeless tobacco: Never Used  . Alcohol Use: 0.0 oz/week    0 Standard drinks or equivalent per week  . Drug Use: No  . Sexual Activity: Not Asked   Other Topics Concern  . None   Social History Narrative    Review of Systems  Constitutional: Negative for fever, chills, activity change, appetite change, fatigue and unexpected weight change.  HENT: Positive for congestion, postnasal drip, rhinorrhea and sneezing. Negative for ear discharge, ear  pain, hearing loss, nosebleeds, sinus pressure, sore throat, tinnitus, trouble swallowing and voice change.   Eyes: Negative for discharge, redness, itching and visual disturbance.  Respiratory: Positive for cough. Negative for chest tightness, shortness of breath, wheezing and stridor.   Cardiovascular: Negative for chest pain, palpitations and leg swelling.  Gastrointestinal: Negative for abdominal pain and abdominal distention.  Genitourinary: Negative for dysuria, urgency and difficulty urinating.  Musculoskeletal: Negative for myalgias, arthralgias, gait problem, neck pain and neck stiffness.  Skin: Negative for color change and rash.  Neurological: Negative for dizziness, facial asymmetry and headaches.  Hematological: Negative for adenopathy.  Psychiatric/Behavioral: Negative for sleep disturbance and dysphoric mood. The patient is not nervous/anxious.        Objective:    BP 108/62 mmHg  Pulse 68  Temp(Src) 97.8 F (36.6 C) (Oral)  Wt 211 lb (95.709 kg)  SpO2 98% Physical Exam  Constitutional: He is oriented to person, place, and time. He appears well-developed and well-nourished. No distress.  HENT:  Head: Normocephalic and atraumatic.  Right Ear: External ear normal.  Left Ear: External ear normal.  Nose: Nose normal.  Mouth/Throat: Oropharynx is clear and moist. No oropharyngeal exudate.  Eyes: Conjunctivae and EOM are normal. Pupils are equal, round, and reactive to light. Right eye exhibits no discharge. Left eye exhibits no discharge. No  scleral icterus.  Neck: Normal range of motion. Neck supple. No tracheal deviation present. No thyromegaly present.  Cardiovascular: Normal rate, regular rhythm and normal heart sounds.  Exam reveals no gallop and no friction rub.   No murmur heard. Pulmonary/Chest: Effort normal and breath sounds normal. No accessory muscle usage. No tachypnea. No respiratory distress. He has no decreased breath sounds. He has no wheezes. He has no  rhonchi. He has no rales. He exhibits no tenderness.  Musculoskeletal: Normal range of motion. He exhibits no edema.  Lymphadenopathy:    He has no cervical adenopathy.  Neurological: He is alert and oriented to person, place, and time. No cranial nerve deficit. Coordination normal.  Skin: Skin is warm and dry. No rash noted. He is not diaphoretic. No erythema. No pallor.  Psychiatric: He has a normal mood and affect. His behavior is normal. Judgment and thought content normal.          Assessment & Plan:   Problem List Items Addressed This Visit      Unprioritized   Allergic rhinitis due to pollen    Discussed some interventions to help with allergic rhinitis. He will try changing antihistamine to Allegra. Will add Singulair and continue Flonase. Follow up prn if symptoms not improving.      Relevant Medications   montelukast (SINGULAIR) 10 MG tablet   CAP (community acquired pneumonia) - Primary    Reviewed recent CXR showing RML pneumonia. Completed augmentin.Will repeat CXR today. Follow up prn.      Relevant Medications   montelukast (SINGULAIR) 10 MG tablet   Other Relevant Orders   DG Chest 2 View       Return in about 4 weeks (around 05/12/2015), or if symptoms worsen or fail to improve, for Recheck.  Ronette Deter, MD Internal Medicine [facility]

## 2015-05-08 ENCOUNTER — Other Ambulatory Visit: Payer: Self-pay | Admitting: Internal Medicine

## 2015-05-09 ENCOUNTER — Other Ambulatory Visit: Payer: Self-pay | Admitting: Internal Medicine

## 2015-05-20 ENCOUNTER — Other Ambulatory Visit: Payer: Self-pay

## 2015-05-20 DIAGNOSIS — J301 Allergic rhinitis due to pollen: Secondary | ICD-10-CM

## 2015-05-20 MED ORDER — MONTELUKAST SODIUM 10 MG PO TABS: 10.0000 mg | ORAL_TABLET | Freq: Every day | ORAL | Status: DC

## 2015-06-22 ENCOUNTER — Ambulatory Visit (INDEPENDENT_AMBULATORY_CARE_PROVIDER_SITE_OTHER): Payer: BLUE CROSS/BLUE SHIELD | Admitting: Internal Medicine

## 2015-06-22 VITALS — BP 110/68 | HR 67 | Temp 98.1°F | Resp 15 | Wt 207.0 lb

## 2015-06-22 DIAGNOSIS — E785 Hyperlipidemia, unspecified: Secondary | ICD-10-CM | POA: Diagnosis not present

## 2015-06-22 DIAGNOSIS — Z79899 Other long term (current) drug therapy: Secondary | ICD-10-CM

## 2015-06-22 DIAGNOSIS — Z8701 Personal history of pneumonia (recurrent): Secondary | ICD-10-CM | POA: Diagnosis not present

## 2015-06-22 DIAGNOSIS — J301 Allergic rhinitis due to pollen: Secondary | ICD-10-CM

## 2015-06-22 DIAGNOSIS — Z87891 Personal history of nicotine dependence: Secondary | ICD-10-CM

## 2015-06-22 DIAGNOSIS — J189 Pneumonia, unspecified organism: Secondary | ICD-10-CM

## 2015-06-22 DIAGNOSIS — Z125 Encounter for screening for malignant neoplasm of prostate: Secondary | ICD-10-CM

## 2015-06-22 MED ORDER — NICOTINE 21 MG/24HR TD PT24: MEDICATED_PATCH | TRANSDERMAL | Status: DC

## 2015-06-22 NOTE — Progress Notes (Signed)
Subjective:  Patient ID: Alexander Birch., male    DOB: 27-May-1960  Age: 55 y.o. MRN: [id number]  CC: The primary encounter diagnosis was History of pneumonia. Diagnoses of Hyperlipidemia, Long-term use of high-risk medication, Prostate cancer screening, CAP (community acquired pneumonia), History of tobacco abuse, and Non-seasonal allergic rhinitis due to pollen were also pertinent to this visit.  HPI Alexander Birch. presents for refill on nicotine patches.  Last seen by me Jan 2016.  He was treated for RML pneumonia in  April by Dr Gilford Rile  With augmentin.  All symptoms have resolved.   Taking Eugenics, has given him more energy . 3 capsules in the morning 30 minutes before breakfast  . One 30 day supply is costing him $80/month . Sleeping better .   Allergic rhinitis: she is Using singulair  And zyrtec on a daily basis     Outpatient Prescriptions Prior to Visit  Medication Sig Dispense Refill  . aspirin 81 MG tablet Take 81 mg by mouth daily.    . cetirizine-pseudoephedrine (ZYRTEC-D) 5-120 MG tablet Take 1 tablet by mouth 2 (two) times daily. 30 tablet 0  . fluticasone (FLONASE) 50 MCG/ACT nasal spray Place 2 sprays into both nostrils daily. 48 g 1  . montelukast (SINGULAIR) 10 MG tablet Take 1 tablet (10 mg total) by mouth at bedtime. 90 tablet 1  . Multiple Vitamin (MULTIVITAMIN) tablet Take 1 tablet by mouth daily.    . simvastatin (ZOCOR) 40 MG tablet TAKE 1 TABLET (40 MG TOTAL) BY MOUTH DAILY AT 6 PM. 90 tablet 0  . tamsulosin (FLOMAX) 0.4 MG CAPS capsule TAKE 1 CAPSULE (0.4 MG TOTAL) BY MOUTH DAILY. 90 capsule 0  . [facility] NTS STEP 1 21 MG/24HR patch PLACE 1 PATCH ONTO SKIN DAILY 28 patch 3   No facility-administered medications prior to visit.    Review of Systems;  Patient denies headache, fevers, malaise, unintentional weight loss, skin rash, eye pain, sinus congestion and sinus pain, sore throat, dysphagia,  hemoptysis , cough, dyspnea, wheezing, chest pain,  palpitations, orthopnea, edema, abdominal pain, nausea, melena, diarrhea, constipation, flank pain, dysuria, hematuria, urinary  Frequency, nocturia, numbness, tingling, seizures,  Focal weakness, Loss of consciousness,  Tremor, insomnia, depression, anxiety, and suicidal ideation.      Objective:  BP 110/68 mmHg  Pulse 67  Temp(Src) 98.1 F (36.7 C) (Oral)  Resp 15  Wt 207 lb (93.895 kg)  SpO2 96%  BP Readings from Last 3 Encounters:  06/22/15 110/68  04/14/15 108/62  04/03/15 129/70    Wt Readings from Last 3 Encounters:  06/22/15 207 lb (93.895 kg)  04/14/15 211 lb (95.709 kg)  04/03/15 211 lb (95.709 kg)    General appearance: alert, cooperative and appears stated age Ears: normal TM's and external ear canals both ears Throat: lips, mucosa, and tongue normal; teeth and gums normal Neck: no adenopathy, no carotid bruit, supple, symmetrical, trachea midline and thyroid not enlarged, symmetric, no tenderness/mass/nodules Back: symmetric, no curvature. ROM normal. No CVA tenderness. Lungs: clear to auscultation bilaterally Heart: regular rate and rhythm, S1, S2 normal, no murmur, click, rub or gallop Abdomen: soft, non-tender; bowel sounds normal; no masses,  no organomegaly Pulses: 2+ and symmetric Skin: Skin color, texture, turgor normal. No rashes or lesions Lymph nodes: Cervical, supraclavicular, and axillary nodes normal.  No results found for: HGBA1C  Lab Results  Component Value Date   CREATININE 0.82 06/22/2015   CREATININE 1.0 11/04/2013   CREATININE 0.9 01/14/2013  Lab Results  Component Value Date   WBC 6.4 01/15/2014   HGB 13.5 01/15/2014   HCT 40.7 01/15/2014   PLT 182.0 01/15/2014   GLUCOSE 85 06/22/2015   CHOL 174 01/15/2014   TRIG 225.0* 01/15/2014   HDL 37.70* 01/15/2014   LDLDIRECT 98.0 06/22/2015   LDLCALC 100* 01/14/2013   ALT 14 06/22/2015   AST 17 06/22/2015   NA 139 06/22/2015   K 4.3 06/22/2015   CL 104 06/22/2015   CREATININE  0.82 06/22/2015   BUN 11 06/22/2015   CO2 28 06/22/2015   PSA 0.30 06/22/2015    Dg Chest 2 View  04/14/2015  CLINICAL DATA:  Community-acquired pneumonia EXAM: CHEST  2 VIEW COMPARISON:  04/03/2015 FINDINGS: The heart size and mediastinal contours are within normal limits. Both lungs are clear. The visualized skeletal structures are unremarkable. No evidence of right middle lobe pneumonia. IMPRESSION: No active cardiopulmonary disease. Electronically Signed   By: Franchot Gallo M.D.   On: 04/14/2015 08:07    Assessment & Plan:   Problem List Items Addressed This Visit    History of tobacco abuse    Abstinence maintained with nicoderm patches.      CAP (community acquired pneumonia)    Treated in April.  Repeat chest x ray is needed to ensure clearing of lower lobe infiltrate.       Allergic rhinitis due to pollen    Managed with singulair and zyrtec. Asymptomatic currently.       Hyperlipidemia   Relevant Orders   LDL cholesterol, direct (Completed)    Other Visit Diagnoses    History of pneumonia    -  Primary    Relevant Orders    DG Chest 2 View    Long-term use of high-risk medication        Relevant Orders    Comp Met (CMET) (Completed)    Prostate cancer screening        Relevant Orders    PSA (Completed)       I have changed Mr. Offer's [facility] NTS STEP 1 to nicotine. I am also having him maintain his aspirin, multivitamin, fluticasone, cetirizine-pseudoephedrine, simvastatin, tamsulosin, and montelukast.  Meds ordered this encounter  Medications  . nicotine ([facility] NTS STEP 1) 21 mg/24hr patch    Sig: PLACE 1 PATCH ONTO SKIN DAILY    Dispense:  28 patch    Refill:  11    Medications Discontinued During This Encounter  Medication Reason  . [facility] NTS STEP 1 21 MG/24HR patch Reorder    Follow-up: Return for needs to schedule a chest x ray wednesday .   [name], MD

## 2015-06-22 NOTE — Patient Instructions (Signed)
I have refilled your nicoderm patches   Please return on Wednesday for your chest x ray  Please schedule an annual exam this summer /fall  I am checking your PSA today  You may not need singulair and zyrtec

## 2015-06-22 NOTE — Progress Notes (Signed)
Pre visit review using our clinic review tool, if applicable. No additional management support is needed unless otherwise documented below in the visit note. 

## 2015-06-23 ENCOUNTER — Ambulatory Visit: Payer: Self-pay

## 2015-06-23 ENCOUNTER — Encounter: Payer: Self-pay | Admitting: Internal Medicine

## 2015-06-23 LAB — PSA: PSA: 0.3 ng/mL (ref 0.10–4.00)

## 2015-06-23 LAB — COMPREHENSIVE METABOLIC PANEL
ALBUMIN: 4.2 g/dL (ref 3.5–5.2)
ALT: 14 U/L (ref 0–53)
AST: 17 U/L (ref 0–37)
Alkaline Phosphatase: 53 U/L (ref 39–117)
BILIRUBIN TOTAL: 0.3 mg/dL (ref 0.2–1.2)
BUN: 11 mg/dL (ref 6–23)
CALCIUM: 9.2 mg/dL (ref 8.4–10.5)
CO2: 28 mEq/L (ref 19–32)
Chloride: 104 mEq/L (ref 96–112)
Creatinine, Ser: 0.82 mg/dL (ref 0.40–1.50)
GFR: 103.33 mL/min (ref >60.00)
Glucose, Bld: 85 mg/dL (ref 70–99)
POTASSIUM: 4.3 meq/L (ref 3.5–5.1)
Sodium: 139 mEq/L (ref 135–145)
TOTAL PROTEIN: 6.9 g/dL (ref 6.0–8.3)

## 2015-06-23 LAB — LDL CHOLESTEROL, DIRECT: Direct LDL: 98 mg/dL

## 2015-06-23 NOTE — Assessment & Plan Note (Signed)
Abstinence maintained with nicoderm patches.

## 2015-06-23 NOTE — Assessment & Plan Note (Signed)
Managed with singulair and zyrtec. Asymptomatic currently.

## 2015-06-23 NOTE — Assessment & Plan Note (Signed)
Treated in April.  Repeat chest x ray is needed to ensure clearing of lower lobe infiltrate.

## 2015-06-24 ENCOUNTER — Ambulatory Visit (INDEPENDENT_AMBULATORY_CARE_PROVIDER_SITE_OTHER): Payer: BLUE CROSS/BLUE SHIELD

## 2015-06-24 ENCOUNTER — Encounter: Payer: Self-pay | Admitting: *Deleted

## 2015-06-24 DIAGNOSIS — Z8701 Personal history of pneumonia (recurrent): Secondary | ICD-10-CM | POA: Diagnosis not present

## 2015-08-03 ENCOUNTER — Other Ambulatory Visit: Payer: Self-pay | Admitting: Internal Medicine

## 2015-08-15 DIAGNOSIS — Z23 Encounter for immunization: Secondary | ICD-10-CM | POA: Diagnosis not present

## 2015-08-31 ENCOUNTER — Emergency Department: Payer: BLUE CROSS/BLUE SHIELD

## 2015-08-31 ENCOUNTER — Telehealth: Payer: Self-pay | Admitting: Family

## 2015-08-31 ENCOUNTER — Encounter: Payer: Self-pay | Admitting: *Deleted

## 2015-08-31 ENCOUNTER — Telehealth: Payer: Self-pay | Admitting: Internal Medicine

## 2015-08-31 ENCOUNTER — Emergency Department
Admission: EM | Admit: 2015-08-31 | Discharge: 2015-08-31 | Disposition: A | Discharge status: Home / Self Care | Payer: BLUE CROSS/BLUE SHIELD | Attending: Emergency Medicine | Admitting: Emergency Medicine

## 2015-08-31 ENCOUNTER — Ambulatory Visit (INDEPENDENT_AMBULATORY_CARE_PROVIDER_SITE_OTHER): Payer: BLUE CROSS/BLUE SHIELD | Admitting: Family

## 2015-08-31 ENCOUNTER — Encounter: Payer: Self-pay | Admitting: Family

## 2015-08-31 VITALS — BP 108/58 | HR 64 | Temp 97.9°F | Ht 70.0 in | Wt 204.2 lb

## 2015-08-31 DIAGNOSIS — M545 Low back pain, unspecified: Secondary | ICD-10-CM

## 2015-08-31 DIAGNOSIS — R1084 Generalized abdominal pain: Secondary | ICD-10-CM | POA: Diagnosis not present

## 2015-08-31 DIAGNOSIS — K5900 Constipation, unspecified: Secondary | ICD-10-CM | POA: Diagnosis not present

## 2015-08-31 DIAGNOSIS — Z79899 Other long term (current) drug therapy: Secondary | ICD-10-CM | POA: Diagnosis not present

## 2015-08-31 DIAGNOSIS — R1030 Lower abdominal pain, unspecified: Secondary | ICD-10-CM | POA: Diagnosis not present

## 2015-08-31 DIAGNOSIS — Z7982 Long term (current) use of aspirin: Secondary | ICD-10-CM | POA: Insufficient documentation

## 2015-08-31 DIAGNOSIS — Z87891 Personal history of nicotine dependence: Secondary | ICD-10-CM | POA: Insufficient documentation

## 2015-08-31 DIAGNOSIS — D649 Anemia, unspecified: Secondary | ICD-10-CM | POA: Diagnosis not present

## 2015-08-31 LAB — POCT URINALYSIS DIPSTICK
Bilirubin, UA: NEGATIVE
GLUCOSE UA: NEGATIVE
Ketones, UA: NEGATIVE
Leukocytes, UA: NEGATIVE
Nitrite, UA: NEGATIVE
Protein, UA: NEGATIVE
UROBILINOGEN UA: 0.2
pH, UA: 5.5

## 2015-08-31 LAB — COMPREHENSIVE METABOLIC PANEL
ALK PHOS: 62 U/L (ref 39–117)
ALT: 15 U/L (ref 0–53)
ALT: 15 U/L — ABNORMAL LOW (ref 17–63)
ANION GAP: 6 (ref 5–15)
AST: 22 U/L (ref 0–37)
AST: 28 U/L (ref 15–41)
Albumin: 4.2 g/dL (ref 3.5–5.2)
Albumin: 4.2 g/dL (ref 3.5–5.0)
Alkaline Phosphatase: 64 U/L (ref 38–126)
BILIRUBIN TOTAL: 0.4 mg/dL (ref 0.2–1.2)
BILIRUBIN TOTAL: 0.3 mg/dL (ref 0.3–1.2)
BUN: 9 mg/dL (ref 6–23)
BUN: 9 mg/dL (ref 6–20)
CALCIUM: 8.6 mg/dL (ref 8.4–10.5)
CALCIUM: 8.8 mg/dL — AB (ref 8.9–10.3)
CO2: 30 mEq/L (ref 19–32)
CO2: 30 mmol/L (ref 22–32)
Chloride: 101 mEq/L (ref 96–112)
Chloride: 103 mmol/L (ref 101–111)
Creatinine, Ser: 0.88 mg/dL (ref 0.40–1.50)
Creatinine, Ser: 0.92 mg/dL (ref 0.61–1.24)
GFR calc Af Amer: >60 mL/min (ref >60)
GFR: 95.18 mL/min (ref >60.00)
GLUCOSE: 111 mg/dL — AB (ref 70–99)
Glucose, Bld: 129 mg/dL — ABNORMAL HIGH (ref 65–99)
POTASSIUM: 4.9 mmol/L (ref 3.5–5.1)
Potassium: 4.2 mEq/L (ref 3.5–5.1)
Sodium: 136 mEq/L (ref 135–145)
Sodium: 139 mmol/L (ref 135–145)
TOTAL PROTEIN: 6.7 g/dL (ref 6.0–8.3)
TOTAL PROTEIN: 7.1 g/dL (ref 6.5–8.1)

## 2015-08-31 LAB — CBC WITH DIFFERENTIAL/PLATELET
BASOS PCT: 0 % (ref 0.0–3.0)
Basophils Absolute: 0 10*3/uL (ref 0.0–0.1)
EOS PCT: 1.1 % (ref 0.0–5.0)
Eosinophils Absolute: 0.1 10*3/uL (ref 0.0–0.7)
HEMATOCRIT: 37.2 % — AB (ref 39.0–52.0)
Hemoglobin: 12.8 g/dL — ABNORMAL LOW (ref 13.0–17.0)
LYMPHS PCT: 24.5 % (ref 12.0–46.0)
Lymphs Abs: 1.9 10*3/uL (ref 0.7–4.0)
MCHC: 34.3 g/dL (ref 30.0–36.0)
MCV: 93.8 fl (ref 78.0–100.0)
MONOS PCT: 3.1 % (ref 3.0–12.0)
Monocytes Absolute: 0.2 10*3/uL (ref 0.1–1.0)
NEUTROS ABS: 5.5 10*3/uL (ref 1.4–7.7)
Neutrophils Relative %: 71.3 % (ref 43.0–77.0)
PLATELETS: 172 10*3/uL (ref 150.0–400.0)
RBC: 3.96 Mil/uL — ABNORMAL LOW (ref 4.22–5.81)
RDW: 20.1 % — AB (ref 11.5–15.5)
WBC: 7.7 10*3/uL (ref 4.0–10.5)

## 2015-08-31 LAB — URINALYSIS COMPLETE WITH MICROSCOPIC (ARMC ONLY)
BACTERIA UA: NONE SEEN
Bilirubin Urine: NEGATIVE
GLUCOSE, UA: NEGATIVE mg/dL
HGB URINE DIPSTICK: NEGATIVE
KETONES UR: NEGATIVE mg/dL
LEUKOCYTES UA: NEGATIVE
NITRITE: NEGATIVE
Protein, ur: NEGATIVE mg/dL
SPECIFIC GRAVITY, URINE: 1.012 (ref 1.005–1.030)
Squamous Epithelial / LPF: NONE SEEN
pH: 5 (ref 5.0–8.0)

## 2015-08-31 LAB — URINALYSIS, ROUTINE W REFLEX MICROSCOPIC
Bilirubin Urine: NEGATIVE
Hgb urine dipstick: NEGATIVE
KETONES UR: NEGATIVE
Leukocytes, UA: NEGATIVE
Nitrite: NEGATIVE
PH: 6 (ref 5.0–8.0)
RBC / HPF: NONE SEEN (ref 0–?)
Total Protein, Urine: NEGATIVE
Urine Glucose: NEGATIVE
Urobilinogen, UA: 0.2 (ref 0.0–1.0)

## 2015-08-31 LAB — CBC
HEMATOCRIT: 39.7 % — AB (ref 40.0–52.0)
HEMOGLOBIN: 13.8 g/dL (ref 13.0–18.0)
MCH: 33.2 pg (ref 26.0–34.0)
MCHC: 34.6 g/dL (ref 32.0–36.0)
MCV: 95.8 fL (ref 80.0–100.0)
Platelets: 141 10*3/uL — ABNORMAL LOW (ref 150–440)
RBC: 4.15 MIL/uL — ABNORMAL LOW (ref 4.40–5.90)
RDW: 19.7 % — AB (ref 11.5–14.5)
WBC: 8.7 10*3/uL (ref 3.8–10.6)

## 2015-08-31 LAB — LIPASE, BLOOD: LIPASE: 23 U/L (ref 11–51)

## 2015-08-31 LAB — TROPONIN I: Troponin I: <0.03 ng/mL (ref <0.03)

## 2015-08-31 LAB — LIPASE: Lipase: 17 U/L (ref 11.0–59.0)

## 2015-08-31 MED ORDER — IOPAMIDOL (ISOVUE-300) INJECTION 61%
100.0000 mL | Freq: Once | INTRAVENOUS | Status: AC | PRN
  Administered 2015-08-31: 100 mL via INTRAVENOUS
  Filled 2015-08-31: qty 100

## 2015-08-31 MED ORDER — DIATRIZOATE MEGLUMINE & SODIUM 66-10 % PO SOLN
30.0000 mL | Freq: Once | ORAL | Status: AC
  Administered 2015-08-31: 30 mL via ORAL

## 2015-08-31 NOTE — Telephone Encounter (Signed)
Mr. Alexander Little called asking to receive his lab results from today's visit. He said he was told he'd get a call today.  Pt's ph# [phone] Thank you.

## 2015-08-31 NOTE — Telephone Encounter (Signed)
PAtient wasseen today by Vidal Schwalbe, please advise if these are completed, thanks

## 2015-08-31 NOTE — Progress Notes (Signed)
Pre visit review using our clinic review tool, if applicable. No additional management support is needed unless otherwise documented below in the visit note. 

## 2015-08-31 NOTE — Patient Instructions (Addendum)
My pleasure meeting you today. As I discussed, the etiology of the low back pain is nonspecific at this time I suspect muscular etiology however I am pending stat labs to evaluate for urinary causes or acute infectious causes of abdomen. In the interim, if your pain acutely worsens or new symptoms present, I advised she go immediately to the emergency room.

## 2015-08-31 NOTE — ED Notes (Signed)
Pt discharged to home.  Family member driving.  Discharge instructions reviewed.  Verbalized understanding.  No questions or concerns at this time.  Teach back verified.  Pt in NAD.  No items left in ED.   

## 2015-08-31 NOTE — Telephone Encounter (Signed)
Discussed results over phone- see visit note for detail.

## 2015-08-31 NOTE — Progress Notes (Signed)
Subjective:    Patient ID: Alexander Little., male    DOB: December 14, 1960, 55 y.o.   MRN: [id number]  CC: Alexander Little. is a 55 y.o. male who presents today for an acute visit.    HPI: Patient here for evaluation of acute back pain with acute onset 3 days ago. Back pain across low back and radiates ro low abdomen. Worse in low back. Aching pain. No injury. Works as Administrator. About 10 years ago out of work for days due to LBP when 'pulled muscle'. Since back pain started this time, he notes trouble getting stream going which started yesterday. No dysuria, hematuria, fever, chills, nausea, vomiting. Decreased appetite as has not BM in 3 days. Started on miralax without much relief, only small BM. Low back pain worse with movement and improves when laying down. Tried pain medication ( cannot recall name), with some relief. No lower extremity numbness or tingling.  H/o of BPH, on flomax. No h/o prostatitis. No h/o cancer.  Known murmur.   Colonoscopy 2016, 1 polyp , diverticulosis.          HISTORY:  Past Medical History:  Diagnosis Date  . Hyperlipidemia    Past Surgical History:  Procedure Laterality Date  . CARDIAC CATHETERIZATION  2012   normal, [place]  . PROSTATE BIOPSY  2012   normal   Family History  Problem Relation Age of Onset  . Stroke Mother   . Heart disease Mother   . Mental illness Mother   . COPD Mother   . Cancer Father 23    brain ca  . COPD Father   . Early death Father     Allergies: Sulfa antibiotics Current Outpatient Prescriptions on File Prior to Visit  Medication Sig Dispense Refill  . aspirin 81 MG tablet Take 81 mg by mouth daily.    . cetirizine-pseudoephedrine (ZYRTEC-D) 5-120 MG tablet Take 1 tablet by mouth 2 (two) times daily. 30 tablet 0  . fluticasone (FLONASE) 50 MCG/ACT nasal spray Place 2 sprays into both nostrils daily. 48 g 1  . montelukast (SINGULAIR) 10 MG tablet Take 1 tablet (10 mg total) by mouth at bedtime. 90  tablet 1  . Multiple Vitamin (MULTIVITAMIN) tablet Take 1 tablet by mouth daily.    . nicotine ([facility] NTS STEP 1) 21 mg/24hr patch PLACE 1 PATCH ONTO SKIN DAILY 28 patch 11  . simvastatin (ZOCOR) 40 MG tablet TAKE 1 TABLET (40 MG TOTAL) BY MOUTH DAILY AT 6 PM. 90 tablet 1  . tamsulosin (FLOMAX) 0.4 MG CAPS capsule TAKE 1 CAPSULE (0.4 MG TOTAL) BY MOUTH DAILY. 90 capsule 1  . [DISCONTINUED] cetirizine (ZYRTEC) 10 MG tablet Take 10 mg by mouth daily.     No current facility-administered medications on file prior to visit.     Social History  Substance Use Topics  . Smoking status: Former Smoker    Packs/day: 1.50    Types: Cigarettes    Quit date: 08/05/2013  . Smokeless tobacco: Never Used  . Alcohol use 0.0 oz/week    Review of Systems  Constitutional: Negative for chills and fever.  Eyes: Negative for visual disturbance.  Respiratory: Negative for cough.   Cardiovascular: Negative for chest pain and palpitations.  Gastrointestinal: Positive for abdominal pain and constipation. Negative for abdominal distention, blood in stool, nausea and vomiting.  Genitourinary: Negative for dysuria, frequency, hematuria, penile swelling and urgency.  Musculoskeletal: Positive for back pain.  Neurological: Negative for weakness, numbness and  headaches.      Objective:    BP (!) 108/58   Pulse 64   Temp 97.9 F (36.6 C) (Oral)   Ht 5\' 10"  (1.778 m)   Wt 204 lb 3.2 oz (92.6 kg)   SpO2 98%   BMI 29.30 kg/m   Last BP 110/68, 67. 06/2014  Physical Exam  Constitutional: He appears well-developed and well-nourished.  Cardiovascular: Regular rhythm.   Murmur heard.  Systolic murmur is present with a grade of 2/6  SEM II/VI, Loudest LSB, non radiating, no thrill   Pulmonary/Chest: Effort normal and breath sounds normal. No respiratory distress. He has no wheezes. He has no rales.  Abdominal: Soft. Normal appearance, normal aorta and bowel sounds are normal. He exhibits no distension, no  pulsatile midline mass and no mass. There is tenderness in the suprapubic area. There is no rigidity, no rebound, no guarding and no CVA tenderness.  Mild diffuse tenderness across lower abdomen. No focal pain. No abdominal bruits.  Genitourinary: Rectum normal and prostate normal. Rectal exam shows no external hemorrhoid, no mass and no tenderness. Prostate is not enlarged and not tender.  Genitourinary Comments: No masses or nodules appreciated during DRE lobes of prostate.  Prostate nontender, not boggy, asymmetric.  Musculoskeletal:       Lumbar back: He exhibits tenderness and pain. He exhibits normal range of motion, no bony tenderness, no swelling, no edema and no spasm.       Back:  Diffuse tenderness bilateral sides of back as marked on diagram. Notable pain and grimace when moving from supine to sitting on exam table.  Full range of motion with flexion, extension, lateral side bends. No pain, numbness, tingling elicited with single leg raise bilaterally. No rash.  Neurological: He is alert.  LE strength normal.   Skin: Skin is warm and dry.  Psychiatric: He has a normal mood and affect. His speech is normal and behavior is normal.  Vitals reviewed.      Assessment & Plan:  1. Bilateral low back pain without sciatica 2. Suprapubic pain, acute, unspecified laterality Suspect muscle etiology. Called patient at 450pm to discuss results. Lab evaluation revealed normocytic anemia, though no significant drop in hemoglobin ( prior one year ago had been 13.5) to suggest acute bleed. No blood in urine to suggest renal stone, urinary infection, or prostatitis ( also urinary  hesitancy had resolved when called patient). WBCs normal therefore acute infection such as diverticulitis unlikely.  Added on ferritin and iron studies to evaluate iron deficiency.No source of bleeding- no blood from stool. No h/o GIB. Patient denied SOB, fatigue. Vitals at basline.  Advised close vigilance of symptoms and  if any new symptoms or pain worsens over night, advised to go to ER. Advised  heat and ibuprofen PRN for low back.   - Comprehensive metabolic panel - Lipase - POCT urinalysis dipstick - Urinalysis, Routine w reflex microscopic (not at [facility]) - CBC with Differential/Platelet     I am having Mr. Donson maintain his aspirin, multivitamin, fluticasone, cetirizine-pseudoephedrine, montelukast, nicotine, simvastatin, tamsulosin, FLUARIX QUADRIVALENT, and PNEUMOVAX 23.   Meds ordered this encounter  Medications  . FLUARIX QUADRIVALENT 0.5 ML injection    Sig: inject 0.5 milliliter intramuscularly    Refill:  0  . PNEUMOVAX 23 25 MCG/0.5ML injection    Sig: inject 0.5 milliliter intramuscularly    Refill:  0    Return precautions given.   Risks, benefits, and alternatives of the medications and treatment plan prescribed today  were discussed, and patient expressed understanding.   Education regarding symptom management and diagnosis given to patient on AVS.  Continue to follow with TULLO, Aris Everts, MD for routine health maintenance.   Alexander Little. and I agreed with plan.   Mable Paris, FNP

## 2015-08-31 NOTE — ED Notes (Signed)
Pt to triage via w/c with no distress noted, brought in by EMS; EMS reports pt with back pain since Friday; seen PCP today with no dx; PTA had onset abd pain then had BM and was diaphoretic; EMS st pt warm & dry upon arrival; 20GA SL to left hand; pain resolved enroute

## 2015-08-31 NOTE — ED Provider Notes (Signed)
[facility] Emergency Department Provider Note        Time seen: ----------------------------------------- 9:16 PM on 08/31/2015 -----------------------------------------    I have reviewed the triage vital signs and the nursing notes.   HISTORY  Chief Complaint Back Pain and Abdominal Pain    HPI Alexander Little. is a 55 y.o. male who presents the ER brought by EMS for abdominal pain and low back pain. Patient reports he saw his doctor today for low back pain and this is thought to be a muscle spasm. Patient states he took a nap this evening and woke up with abdominal pain and had diffuse diaphoresis. He does not have any symptoms now, thinks he may be constipated. He had take MiraLAX over the past several days. He denies fever, vomiting or other complaints.   Past Medical History:  Diagnosis Date  . Hyperlipidemia     Patient Active Problem List   Diagnosis Date Noted  . CAP (community acquired pneumonia) 04/14/2015  . Allergic rhinitis due to pollen 04/14/2015  . 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 GERD (gastroesophageal reflux disease) 01/14/2013  . Obesity 07/16/2012  . History of tobacco abuse 11/13/2011  . Thyroid nodule 11/13/2011  . Elevated PSA, less than 10 ng/ml 11/13/2011  . Constipation 08/23/2011  . Screening for colon cancer 08/23/2011  . Acquired deviated nasal septum 08/22/2011  . masseter muscle hypertrophy 08/22/2011  . Hyperlipidemia     Past Surgical History:  Procedure Laterality Date  . CARDIAC CATHETERIZATION  2012   normal, [place]  . PROSTATE BIOPSY  2012   normal    Allergies Sulfa antibiotics  Social History Social History  Substance Use Topics  . Smoking status: Former Smoker    Packs/day: 1.50    Types: Cigarettes    Quit date: 08/05/2013  . Smokeless tobacco: Never Used  . Alcohol use 0.0 oz/week    Review of Systems Constitutional: Negative for fever. Cardiovascular: Negative for  chest pain. Respiratory: Negative for shortness of breath. Gastrointestinal: Positive for abdominal pain, constipation Genitourinary: Negative for dysuria. Musculoskeletal: Positive for low back pain Skin: Negative for rash. Neurological: Negative for headaches, focal weakness or numbness.  10-point ROS otherwise negative.  ____________________________________________   PHYSICAL EXAM:  VITAL SIGNS: ED Triage Vitals  Enc Vitals Group     BP 08/31/15 2009 137/60     Pulse Rate 08/31/15 2009 (!) 57     Resp 08/31/15 2009 20     Temp 08/31/15 2009 98.2 F (36.8 C)     Temp src --      SpO2 08/31/15 2009 99 %     Weight 08/31/15 2014 198 lb (89.8 kg)     Height 08/31/15 2014 5\' 9"  (1.753 m)     Head Circumference --      Peak Flow --      Pain Score 08/31/15 2014 2     Pain Loc --      Pain Edu? --      Excl. in [place]? --     Constitutional: Alert and oriented. Well appearing and in no distress. Eyes: Conjunctivae are normal. PERRL. Normal extraocular movements. ENT   Head: Normocephalic and atraumatic.   Nose: No congestion/rhinnorhea.   Mouth/Throat: Mucous membranes are moist.   Neck: No stridor. Cardiovascular: Normal rate, regular rhythm. No murmurs, rubs, or gallops. Respiratory: Normal respiratory effort without tachypnea nor retractions. Breath sounds are clear and equal bilaterally. No wheezes/rales/rhonchi. Gastrointestinal: Soft and nontender. Normal bowel sounds  Musculoskeletal: Nontender with normal range of motion in all extremities. No lower extremity tenderness nor edema. Neurologic:  Normal speech and language. No gross focal neurologic deficits are appreciated.  Skin:  Skin is warm, dry and intact. No rash noted. Psychiatric: Mood and affect are normal. Speech and behavior are normal.   Prehospital EKG interpreted by me reveals sinus rhythm with normal axis, normal intervals, no evidence of hypertrophy or acute  infarction.  ____________________________________________  ED COURSE:  Pertinent labs & imaging results that were available during my care of the patient were reviewed by me and considered in my medical decision making (see chart for details). Clinical Course  Patient presents to ER with abdominal pain and low back pain of uncertain etiology, likely constipation. We'll check basic labs and likely CT imaging.  Procedures ____________________________________________   LABS (pertinent positives/negatives)  Labs Reviewed  COMPREHENSIVE METABOLIC PANEL - Abnormal; Notable for the following:       Result Value   Glucose, Bld 129 (*)    Calcium 8.8 (*)    ALT 15 (*)    All other components within normal limits  CBC - Abnormal; Notable for the following:    RBC 4.15 (*)    HCT 39.7 (*)    RDW 19.7 (*)    Platelets 141 (*)    All other components within normal limits  URINALYSIS COMPLETEWITH MICROSCOPIC ([facility] ONLY) - Abnormal; Notable for the following:    Color, Urine YELLOW (*)    APPearance CLEAR (*)    All other components within normal limits  LIPASE, BLOOD  TROPONIN I    RADIOLOGY Images were viewed by me  CT of abdomen and pelvis with contrast IMPRESSION: No acute process demonstrated in the abdomen or pelvis. No evidence of bowel obstruction or inflammation. Aortic atherosclerosis. Enlarged prostate gland. ____________________________________________  FINAL ASSESSMENT AND PLAN  Abdominal pain, low back pain, Constipation  Plan: Patient with labs and imaging as dictated above. Patient's no distress, CT is reassuring. I will encourage increased MiraLAX intake, close outpatient follow-up.   Earleen Newport, MD   Note: This dictation was prepared with Dragon dictation. Any transcriptional errors that result from this process are unintentional    Earleen Newport, MD 08/31/15 2245

## 2015-08-31 NOTE — ED Triage Notes (Signed)
Pt to triage via wheelchair.  Pt brought in by ems.  Pt reports he saw the doctor today for lower back pain.  Pt took a nap this evening and woke up with abd pain.  However , no abd pain now. No n/v/d.  Pt alert.  Speech clear. Iv in place per ems in left hand.

## 2015-09-01 ENCOUNTER — Telehealth: Payer: Self-pay | Admitting: *Deleted

## 2015-09-01 ENCOUNTER — Other Ambulatory Visit (INDEPENDENT_AMBULATORY_CARE_PROVIDER_SITE_OTHER): Payer: BLUE CROSS/BLUE SHIELD

## 2015-09-01 DIAGNOSIS — D649 Anemia, unspecified: Secondary | ICD-10-CM

## 2015-09-01 LAB — FERRITIN: Ferritin: 14.4 ng/mL — ABNORMAL LOW (ref 22.0–322.0)

## 2015-09-01 LAB — IBC PANEL
IRON: 86 ug/dL (ref 42–165)
Saturation Ratios: 21.8 % (ref 20.0–50.0)
TRANSFERRIN: 282 mg/dL (ref 212.0–360.0)

## 2015-09-01 NOTE — Telephone Encounter (Signed)
Spoke with the patient, he was taken to [facility] last night for severe constipation.  He states he is much better today, he has moved his bowels with out difficulty today.  No increased pain or symptoms noted.  Labs were reviewed with him yesterday by NP.

## 2015-09-01 NOTE — Progress Notes (Signed)
Left message for patient to call back  

## 2015-09-01 NOTE — Telephone Encounter (Signed)
Patient stated that he receive labs on 08/28. He requested results Pt contact [phone]

## 2015-09-01 NOTE — Progress Notes (Signed)
Left message for patient to return call back.  

## 2015-09-01 NOTE — Telephone Encounter (Signed)
Patient has been notified

## 2015-09-01 NOTE — Progress Notes (Signed)
Patient feels much better after patient had bowel movement and heating pad.

## 2015-11-30 ENCOUNTER — Telehealth: Payer: Self-pay | Admitting: *Deleted

## 2015-11-30 ENCOUNTER — Observation Stay
Admission: EM | Admit: 2015-11-30 | Discharge: 2015-12-03 | Disposition: A | Discharge status: Home / Self Care | Payer: No Typology Code available for payment source | Attending: Internal Medicine | Admitting: Internal Medicine

## 2015-11-30 ENCOUNTER — Emergency Department: Payer: No Typology Code available for payment source

## 2015-11-30 ENCOUNTER — Encounter: Payer: Self-pay | Admitting: Emergency Medicine

## 2015-11-30 DIAGNOSIS — D649 Anemia, unspecified: Secondary | ICD-10-CM | POA: Diagnosis present

## 2015-11-30 DIAGNOSIS — D589 Hereditary hemolytic anemia, unspecified: Secondary | ICD-10-CM

## 2015-11-30 DIAGNOSIS — D61818 Other pancytopenia: Secondary | ICD-10-CM

## 2015-11-30 DIAGNOSIS — Z6828 Body mass index (BMI) 28.0-28.9, adult: Secondary | ICD-10-CM | POA: Insufficient documentation

## 2015-11-30 DIAGNOSIS — R531 Weakness: Secondary | ICD-10-CM

## 2015-11-30 DIAGNOSIS — E669 Obesity, unspecified: Secondary | ICD-10-CM | POA: Insufficient documentation

## 2015-11-30 DIAGNOSIS — D696 Thrombocytopenia, unspecified: Secondary | ICD-10-CM | POA: Insufficient documentation

## 2015-11-30 DIAGNOSIS — Z79899 Other long term (current) drug therapy: Secondary | ICD-10-CM | POA: Insufficient documentation

## 2015-11-30 DIAGNOSIS — R0609 Other forms of dyspnea: Secondary | ICD-10-CM

## 2015-11-30 DIAGNOSIS — Z7982 Long term (current) use of aspirin: Secondary | ICD-10-CM | POA: Diagnosis not present

## 2015-11-30 DIAGNOSIS — D519 Vitamin B12 deficiency anemia, unspecified: Secondary | ICD-10-CM | POA: Diagnosis present

## 2015-11-30 DIAGNOSIS — R0602 Shortness of breath: Secondary | ICD-10-CM

## 2015-11-30 DIAGNOSIS — Z23 Encounter for immunization: Secondary | ICD-10-CM | POA: Insufficient documentation

## 2015-11-30 DIAGNOSIS — R42 Dizziness and giddiness: Secondary | ICD-10-CM | POA: Diagnosis not present

## 2015-11-30 DIAGNOSIS — N400 Enlarged prostate without lower urinary tract symptoms: Secondary | ICD-10-CM | POA: Diagnosis not present

## 2015-11-30 DIAGNOSIS — R634 Abnormal weight loss: Secondary | ICD-10-CM | POA: Diagnosis not present

## 2015-11-30 DIAGNOSIS — Z808 Family history of malignant neoplasm of other organs or systems: Secondary | ICD-10-CM

## 2015-11-30 DIAGNOSIS — F17210 Nicotine dependence, cigarettes, uncomplicated: Secondary | ICD-10-CM | POA: Diagnosis not present

## 2015-11-30 DIAGNOSIS — R5383 Other fatigue: Secondary | ICD-10-CM | POA: Diagnosis not present

## 2015-11-30 DIAGNOSIS — Z87891 Personal history of nicotine dependence: Secondary | ICD-10-CM

## 2015-11-30 DIAGNOSIS — Z7951 Long term (current) use of inhaled steroids: Secondary | ICD-10-CM | POA: Insufficient documentation

## 2015-11-30 DIAGNOSIS — D539 Nutritional anemia, unspecified: Secondary | ICD-10-CM | POA: Diagnosis not present

## 2015-11-30 DIAGNOSIS — E785 Hyperlipidemia, unspecified: Secondary | ICD-10-CM | POA: Diagnosis not present

## 2015-11-30 DIAGNOSIS — R079 Chest pain, unspecified: Secondary | ICD-10-CM

## 2015-11-30 HISTORY — DX: Benign prostatic hyperplasia without lower urinary tract symptoms: N40.0

## 2015-11-30 HISTORY — DX: Chest pain, unspecified: R07.9

## 2015-11-30 LAB — COMPREHENSIVE METABOLIC PANEL
ALK PHOS: 49 U/L (ref 38–126)
ALT: 24 U/L (ref 17–63)
AST: 35 U/L (ref 15–41)
Albumin: 4.3 g/dL (ref 3.5–5.0)
Anion gap: 7 (ref 5–15)
BUN: 11 mg/dL (ref 6–20)
CALCIUM: 8.4 mg/dL — AB (ref 8.9–10.3)
CHLORIDE: 104 mmol/L (ref 101–111)
CO2: 26 mmol/L (ref 22–32)
CREATININE: 0.76 mg/dL (ref 0.61–1.24)
GFR calc non Af Amer: >60 mL/min (ref >60)
GLUCOSE: 100 mg/dL — AB (ref 65–99)
Potassium: 4 mmol/L (ref 3.5–5.1)
Sodium: 137 mmol/L (ref 135–145)
Total Bilirubin: 1.4 mg/dL — ABNORMAL HIGH (ref 0.3–1.2)
Total Protein: 6.7 g/dL (ref 6.5–8.1)

## 2015-11-30 LAB — DIFFERENTIAL
BAND NEUTROPHILS: 0 %
BASOS ABS: 0 10*3/uL (ref 0–0.1)
BASOS PCT: 1 %
BLASTS: 0 %
EOS PCT: 2 %
Eosinophils Absolute: 0.1 10*3/uL (ref 0–0.7)
LYMPHS ABS: 1.5 10*3/uL (ref 1.0–3.6)
LYMPHS PCT: 44 %
MONOS PCT: 2 %
Metamyelocytes Relative: 0 %
Monocytes Absolute: 0.1 10*3/uL — ABNORMAL LOW (ref 0.2–1.0)
Myelocytes: 0 %
NEUTROS PCT: 51 %
NRBC: 0 /100{WBCs}
Neutro Abs: 1.8 10*3/uL (ref 1.4–6.5)
Other: 0 %
PROMYELOCYTES ABS: 0 %

## 2015-11-30 LAB — CREATININE, SERUM: CREATININE: 0.66 mg/dL (ref 0.61–1.24)

## 2015-11-30 LAB — RETICULOCYTES
RBC.: 1.78 MIL/uL — AB (ref 4.40–5.90)
RETIC COUNT ABSOLUTE: 108.6 10*3/uL (ref 19.0–183.0)
Retic Ct Pct: 6.1 % — ABNORMAL HIGH (ref 0.4–3.1)

## 2015-11-30 LAB — CBC
HCT: 19.9 % — ABNORMAL LOW (ref 40.0–52.0)
HEMATOCRIT: 19.9 % — AB (ref 40.0–52.0)
HEMATOCRIT: 19 % — AB (ref 40.0–52.0)
HEMOGLOBIN: 6.5 g/dL — AB (ref 13.0–18.0)
Hemoglobin: 6.9 g/dL — ABNORMAL LOW (ref 13.0–18.0)
Hemoglobin: 6.8 g/dL — ABNORMAL LOW (ref 13.0–18.0)
MCH: 39.2 pg — ABNORMAL HIGH (ref 26.0–34.0)
MCH: 39.1 pg — AB (ref 26.0–34.0)
MCH: 39.5 pg — ABNORMAL HIGH (ref 26.0–34.0)
MCHC: 34.5 g/dL (ref 32.0–36.0)
MCHC: 34.5 g/dL (ref 32.0–36.0)
MCHC: 34.3 g/dL (ref 32.0–36.0)
MCV: 113.7 fL — AB (ref 80.0–100.0)
MCV: 113.5 fL — ABNORMAL HIGH (ref 80.0–100.0)
MCV: 115.2 fL — ABNORMAL HIGH (ref 80.0–100.0)
PLATELETS: 77 10*3/uL — AB (ref 150–440)
PLATELETS: 90 10*3/uL — AB (ref 150–440)
Platelets: 79 10*3/uL — ABNORMAL LOW (ref 150–440)
RBC: 1.75 MIL/uL — ABNORMAL LOW (ref 4.40–5.90)
RBC: 1.67 MIL/uL — ABNORMAL LOW (ref 4.40–5.90)
RBC: 1.73 MIL/uL — ABNORMAL LOW (ref 4.40–5.90)
RDW: 26.3 % — AB (ref 11.5–14.5)
RDW: 26.3 % — AB (ref 11.5–14.5)
RDW: 26 % — AB (ref 11.5–14.5)
WBC: 3.5 10*3/uL — ABNORMAL LOW (ref 3.8–10.6)
WBC: 3.5 10*3/uL — ABNORMAL LOW (ref 3.8–10.6)
WBC: 3.4 10*3/uL — AB (ref 3.8–10.6)

## 2015-11-30 LAB — PROTIME-INR
INR: 1.1
PROTHROMBIN TIME: 14.2 s (ref 11.4–15.2)

## 2015-11-30 LAB — LACTATE DEHYDROGENASE: LDH: 1029 U/L — AB (ref 98–192)

## 2015-11-30 LAB — ABO/RH: ABO/RH(D): O POS

## 2015-11-30 LAB — TROPONIN I: Troponin I: <0.03 ng/mL (ref <0.03)

## 2015-11-30 LAB — IRON AND TIBC
Iron: 196 ug/dL — ABNORMAL HIGH (ref 45–182)
Saturation Ratios: 74 % — ABNORMAL HIGH (ref 17.9–39.5)
TIBC: 265 ug/dL (ref 250–450)
UIBC: 69 ug/dL

## 2015-11-30 LAB — PREPARE RBC (CROSSMATCH)

## 2015-11-30 LAB — FOLATE: Folate: 21.9 ng/mL (ref >5.9)

## 2015-11-30 LAB — DAT, POLYSPECIFIC AHG (ARMC ONLY): Polyspecific AHG test: NEGATIVE

## 2015-11-30 LAB — TSH: TSH: 3.856 u[IU]/mL (ref 0.350–4.500)

## 2015-11-30 LAB — APTT: APTT: 33 s (ref 24–36)

## 2015-11-30 MED ORDER — FLUTICASONE PROPIONATE 50 MCG/ACT NA SUSP
2.0000 | Freq: Every day | NASAL | Status: DC
  Administered 2015-12-02: 2 via NASAL
  Filled 2015-11-30 (×2): qty 16

## 2015-11-30 MED ORDER — NICOTINE 21 MG/24HR TD PT24
21.0000 mg | MEDICATED_PATCH | Freq: Every day | TRANSDERMAL | Status: DC
  Administered 2015-11-30 – 2015-12-03 (×4): 21 mg via TRANSDERMAL
  Filled 2015-11-30 (×4): qty 1

## 2015-11-30 MED ORDER — SODIUM CHLORIDE 0.9 % IV SOLN
10.0000 mL/h | Freq: Once | INTRAVENOUS | Status: AC
  Administered 2015-11-30: 10 mL/h via INTRAVENOUS

## 2015-11-30 MED ORDER — MONTELUKAST SODIUM 10 MG PO TABS
10.0000 mg | ORAL_TABLET | Freq: Every day | ORAL | Status: DC
  Administered 2015-11-30 – 2015-12-02 (×3): 10 mg via ORAL
  Filled 2015-11-30 (×3): qty 1

## 2015-11-30 MED ORDER — SIMVASTATIN 20 MG PO TABS
20.0000 mg | ORAL_TABLET | Freq: Every day | ORAL | Status: DC
  Administered 2015-11-30 – 2015-12-02 (×3): 20 mg via ORAL
  Filled 2015-11-30 (×3): qty 1

## 2015-11-30 MED ORDER — IOPAMIDOL (ISOVUE-300) INJECTION 61%
15.0000 mL | INTRAVENOUS | Status: AC
  Administered 2015-12-01 (×2): 15 mL via ORAL

## 2015-11-30 MED ORDER — SODIUM CHLORIDE 0.9 % IV BOLUS (SEPSIS)
1000.0000 mL | Freq: Once | INTRAVENOUS | Status: AC
Start: 2015-11-30 — End: 2015-11-30
  Administered 2015-11-30: 1000 mL via INTRAVENOUS

## 2015-11-30 MED ORDER — TAMSULOSIN HCL 0.4 MG PO CAPS
0.4000 mg | ORAL_CAPSULE | Freq: Every day | ORAL | Status: DC
  Administered 2015-11-30 – 2015-12-02 (×3): 0.4 mg via ORAL
  Filled 2015-11-30 (×3): qty 1

## 2015-11-30 MED ORDER — HEPARIN SODIUM (PORCINE) 5000 UNIT/ML IJ SOLN
5000.0000 [IU] | Freq: Three times a day (TID) | INTRAMUSCULAR | Status: DC
  Administered 2015-11-30 – 2015-12-01 (×2): 5000 [IU] via SUBCUTANEOUS
  Filled 2015-11-30 (×2): qty 1

## 2015-11-30 MED ORDER — PNEUMOCOCCAL VAC POLYVALENT 25 MCG/0.5ML IJ INJ: 0.5000 mL | INJECTION | INTRAMUSCULAR | Status: DC

## 2015-11-30 NOTE — Consult Note (Signed)
[facility]  [phone] [phone]  ID: Wynema Birch. OB: 1960-03-12  MR#: [id number]  WYO#:[id number]  [name], [name] [name], MD (Internal Medicine)  CHIEF COMPLAINT: Pancytopenia with severe anemia.  INTERVAL HISTORY: Patient is a 55 year old male who noted worsening dizziness, lightheadedness, and shortness of breath over the past 7-10 days. He also had increased weakness and fatigue. He also reports a greater than 20 pound weight loss over the past year. Upon evaluation in the emergency room he was noted to have a hemoglobin of less than 7.0. He also had a decreased white blood cell count as well as platelet count. Patient denies any bleeding or melena. He otherwise feels well. He has no other neurologic complaints. He denies any fevers or night sweats. He denies any chest pain. He has a good appetite and denies any nausea, vomiting, constipation, or diarrhea. He has no urinary complaints. Patient otherwise feels well and offers no further specific complaints.  REVIEW OF SYSTEMS:   Review of Systems  Constitutional: Positive for malaise/fatigue and weight loss. Negative for chills, diaphoresis and fever.  Respiratory: Positive for shortness of breath. Negative for cough and hemoptysis.   Cardiovascular: Negative.  Negative for chest pain and leg swelling.  Gastrointestinal: Negative.  Negative for abdominal pain, blood in stool and melena.  Genitourinary: Negative.   Musculoskeletal: Negative.   Neurological: Positive for dizziness and weakness. Negative for focal weakness.  Psychiatric/Behavioral: Negative.  The patient is not nervous/anxious.     As per HPI. Otherwise, a complete review of systems is negative.  PAST MEDICAL HISTORY: Past Medical History:  Diagnosis Date  . BPH (benign prostatic hyperplasia)   . Chest pain   . Hyperlipidemia     PAST SURGICAL  HISTORY: Past Surgical History:  Procedure Laterality Date  . CARDIAC CATHETERIZATION  2012   normal, [place]  . PROSTATE BIOPSY  2012   normal    FAMILY HISTORY: Family History  Problem Relation Age of Onset  . Stroke Mother   . Heart disease Mother   . Mental illness Mother   . COPD Mother   . Cancer Father 29    brain ca  . COPD Father   . Early death Father     ADVANCED DIRECTIVES (Y/N):  _0 @  HEALTH MAINTENANCE: Social History  Substance Use Topics  . Smoking status: Current Every Day Smoker    Packs/day: 1.50    Types: Cigarettes    Last attempt to quit: 08/05/2013  . Smokeless tobacco: Never Used  . Alcohol use 0.0 oz/week     Colonoscopy:  PAP:  Bone density:  Lipid panel:  Allergies  Allergen Reactions  . Sulfa Antibiotics Rash    Current Facility-Administered Medications  Medication Dose Route Frequency Provider Last Rate Last Dose  . fluticasone (FLONASE) 50 MCG/ACT nasal spray 2 spray  2 spray Each Nare Daily Vaughan Basta, MD      . heparin injection 5,000 Units  5,000 Units Subcutaneous Q8H Vaughan Basta, MD   5,000 Units at 11/30/15 1951  . [START ON 12/01/2015] iopamidol (ISOVUE-300) 61 % injection 15 mL  15 mL Oral Q1 Hr x 2 Lloyd Huger, MD      . montelukast (SINGULAIR) tablet 10 mg  10 mg Oral QHS Vaughan Basta, MD   10 mg at 11/30/15 1954  . nicotine (NICODERM CQ - dosed in mg/24 hours) patch 21 mg  21  mg Transdermal Daily Vaughan Basta, MD   21 mg at 11/30/15 1952  . [START ON 12/01/2015] pneumococcal 23 valent vaccine (PNU-IMMUNE) injection 0.5 mL  0.5 mL Intramuscular Tomorrow-1000 Vaughan Basta, MD      . simvastatin (ZOCOR) tablet 20 mg  20 mg Oral q1800 Vaughan Basta, MD   20 mg at 11/30/15 1808  . tamsulosin (FLOMAX) capsule 0.4 mg  0.4 mg Oral Daily Vaughan Basta, MD   0.4 mg at 11/30/15 1953    OBJECTIVE: Vitals:   11/30/15 2055 11/30/15 2116  BP: 105/60 97/65    Pulse: 63 64  Resp: 18 17  Temp: 98.1 F (36.7 C) 97.8 F (36.6 C)     Body mass index is 28.06 kg/m.    ECOG FS:1 - Symptomatic but completely ambulatory  General: Well-developed, well-nourished, no acute distress. Eyes: Pink conjunctiva, anicteric sclera. HEENT: Normocephalic, moist mucous membranes, clear oropharnyx. Lungs: Clear to auscultation bilaterally. Heart: Regular rate and rhythm. No rubs, murmurs, or gallops. Abdomen: Soft, nontender, nondistended. No organomegaly noted, normoactive bowel sounds. Musculoskeletal: No edema, cyanosis, or clubbing. Neuro: Alert, answering all questions appropriately. Cranial nerves grossly intact. Skin: No rashes or petechiae noted. Psych: Normal affect. Lymphatics: No cervical, calvicular, axillary or inguinal LAD.   LAB RESULTS:  Lab Results  Component Value Date   NA 137 11/30/2015   K 4.0 11/30/2015   CL 104 11/30/2015   CO2 26 11/30/2015   GLUCOSE 100 (H) 11/30/2015   BUN 11 11/30/2015   CREATININE 0.66 11/30/2015   CALCIUM 8.4 (L) 11/30/2015   PROT 6.7 11/30/2015   ALBUMIN 4.3 11/30/2015   AST 35 11/30/2015   ALT 24 11/30/2015   ALKPHOS 49 11/30/2015   BILITOT 1.4 (H) 11/30/2015   GFRNONAA >60 11/30/2015   GFRAA >60 11/30/2015    Lab Results  Component Value Date   WBC 3.4 (L) 11/30/2015   NEUTROABS 1.8 11/30/2015   HGB 6.8 (L) 11/30/2015   HCT 19.9 (L) 11/30/2015   MCV 115.2 (H) 11/30/2015   PLT 90 (L) 11/30/2015     STUDIES: Dg Chest 2 View  Result Date: 11/30/2015 CLINICAL DATA:  Shortness breath for 1 week. EXAM: CHEST  2 VIEW COMPARISON:  06/24/2015 FINDINGS: Stable densities and lucency at the right lung apex. Findings could be related to scarring and a bleb at the right apex. Otherwise, both lungs are clear. Atherosclerotic calcifications at the aortic arch. The heart size is normal. No pleural effusions. No acute bone abnormality. IMPRESSION: No active cardiopulmonary disease. Aortic atherosclerosis.  Electronically Signed   By: Markus Daft M.D.   On: 11/30/2015 13:19    ASSESSMENT: Pancytopenia with severe anemia.  PLAN:    1. 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 Direct antibody testing ([place]') as well as haptoglobin are pending at time of dictation. His elevated MCV also supports this diagnosis. Agree with blood transfusion at this time although this will likely only be a short-lived solution. Have ordered a CT scan of the chest, abdomen, and pelvis to rule out underlying lymphoma or myeloproliferative disorder. If this is negative, patient could be started on high-dose steroids. Patient does not require bone marrow biopsy at this time, but would consider one in the future if no distinct etiology is determined. 2. Decreased white blood cell count: Likely secondary to possible underlying myeloproliferative disorder. CT scans as above. Have also ordered a flow cytometry for completeness. 2. Thrombocytopenia:  Again likely secondary to possible underlying myeloproliferative disorder. Will consider bone marrow biopsy as above with no distinct etiology can be determined.  Appreciate consult, will follow.  Lloyd Huger, MD   11/30/2015 10:06 PM

## 2015-11-30 NOTE — ED Provider Notes (Signed)
[facility] Emergency Department Provider Note  ____________________________________________  Time seen: Approximately 2:56 PM  I have reviewed the triage vital signs and the nursing notes.   HISTORY  Chief Complaint Shortness of Breath    HPI Alexander Little. is a 55 y.o. male with a history of GERD, no history of anticoagulation or previous GI bleed, presenting with postural lightheadedness, dyspnea on exertion. The patient reports that for the past several weeks he has had a progressively worsening shortness of breath with exertion, and when he stands she gets lightheaded. He has not had any chest pain, palpitations, fainting. No fever or chills. No nausea vomiting or diarrhea.Last colonoscopy 2/17 with one polyp removed but no other abnormalities   Past Medical History:  Diagnosis Date  . Hyperlipidemia     Patient Active Problem List   Diagnosis Date Noted  . CAP (community acquired pneumonia) 04/14/2015  . Allergic rhinitis due to pollen 04/14/2015  . 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 GERD (gastroesophageal reflux disease) 01/14/2013  . Obesity 07/16/2012  . History of tobacco abuse 11/13/2011  . Thyroid nodule 11/13/2011  . Elevated PSA, less than 10 ng/ml 11/13/2011  . Constipation 08/23/2011  . Screening for colon cancer 08/23/2011  . Acquired deviated nasal septum 08/22/2011  . masseter muscle hypertrophy 08/22/2011  . Hyperlipidemia     Past Surgical History:  Procedure Laterality Date  . CARDIAC CATHETERIZATION  2012   normal, [place]  . PROSTATE BIOPSY  2012   normal    Current Outpatient Rx  . Order #: [id number] Class: Historical Med  . Order #: [id number] Class: Print  . Order #: [id number] Class: Historical Med  . Order #: [id number] Class: Normal  . Order #: [id number] Class: Normal  . Order #: [id number] Class: Normal  . Order #: [id number] Class: Normal  . Order #: [id number] Class: Normal    Allergies Sulfa  antibiotics  Family History  Problem Relation Age of Onset  . Stroke Mother   . Heart disease Mother   . Mental illness Mother   . COPD Mother   . Cancer Father 29    brain ca  . COPD Father   . Early death Father     Social History Social History  Substance Use Topics  . Smoking status: Current Every Day Smoker    Packs/day: 1.50    Types: Cigarettes    Last attempt to quit: 08/05/2013  . Smokeless tobacco: Never Used  . Alcohol use 0.0 oz/week    Review of Systems Constitutional: No fever/chills.Positive postural lightheadedness. Negative syncope. Eyes: No visual changes. ENT: No sore throat. No congestion or rhinorrhea. Cardiovascular: Denies chest pain. Denies palpitations. Respiratory: Positive exertional shortness of breath.  No cough. Gastrointestinal: No abdominal pain.  No nausea, no vomiting.  No diarrhea.  No constipation. No melena. No bloody vomitus. No bloody stool. Genitourinary: Negative for dysuria. Musculoskeletal: Negative for back pain. No lower extremity sling or calf pain. Skin: Negative for rash. Neurological: Negative for headaches. No focal numbness, tingling or weakness.   10-point ROS otherwise negative.  ____________________________________________   PHYSICAL EXAM:  VITAL SIGNS: ED Triage Vitals  Enc Vitals Group     BP 11/30/15 1237 (!) 116/57     Pulse Rate 11/30/15 1237 70     Resp 11/30/15 1237 18     Temp 11/30/15 1237 98 F (36.7 C)     Temp Source 11/30/15 1237 Oral     SpO2 11/30/15 1237 99 %     Weight 11/30/15 1238  190 lb (86.2 kg)     Height 11/30/15 1238 5\' 9"  (1.753 m)     Head Circumference --      Peak Flow --      Pain Score --      Pain Loc --      Pain Edu? --      Excl. in [place]? --     Constitutional: Alert and oriented. Well appearing and in no acute distress. Answers questions appropriately. Eyes: Conjunctivae are normalAnd without pallor.  EOMI. No scleral icterus. Head: Atraumatic. Nose: No  congestion/rhinnorhea. Mouth/Throat: Mucous membranes are moist.  Neck: No stridor.  Supple.  No JVD. No meningismus. Cardiovascular: Normal rate, regular rhythm. No murmurs, rubs or gallops.  Respiratory: Normal respiratory effort.  No accessory muscle use or retractions. Lungs CTAB.  No wheezes, rales or ronchi. Gastrointestinal: Soft, nontender and nondistended.  No guarding or rebound.  No peritoneal signs. GU: No external hemorrhoids are palpable internal hemorrhoids. Stool is brown and guaiac-negative. No pain with rectal examination. Musculoskeletal: No LE edema. No ttp in the calves or palpable cords.  Negative Homan's sign. Neurologic:  A&Ox3.  Speech is clear.  Face and smile are symmetric.  EOMI.  Moves all extremities well. Skin:  Skin is warm, dry and intact. No rash noted. Positive pallor. Psychiatric: Mood and affect are normal. Speech and behavior are normal.  Normal judgement.  ____________________________________________   LABS (all labs ordered are listed, but only abnormal results are displayed)  Labs Reviewed  CBC - Abnormal; Notable for the following:       Result Value   WBC 3.5 (*)    RBC 1.75 (*)    Hemoglobin 6.9 (*)    HCT 19.9 (*)    MCV 113.7 (*)    MCH 39.2 (*)    RDW 26.3 (*)    Platelets 77 (*)    All other components within normal limits  COMPREHENSIVE METABOLIC PANEL - Abnormal; Notable for the following:    Glucose, Bld 100 (*)    Calcium 8.4 (*)    Total Bilirubin 1.4 (*)    All other components within normal limits  TROPONIN I  APTT  PROTIME-INR  CBC  TYPE AND SCREEN  ABO/RH  PREPARE RBC (CROSSMATCH)   ____________________________________________  EKG  ED ECG REPORT I, Eula Listen, the attending physician, personally viewed and interpreted this ECG.   Date: 11/30/2015  EKG Time: 1242  Rate: 64  Rhythm: normal sinus rhythm  Axis: normal  Intervals:none  ST&T Change: No  STEMI  ____________________________________________  RADIOLOGY  Dg Chest 2 View  Result Date: 11/30/2015 CLINICAL DATA:  Shortness breath for 1 week. EXAM: CHEST  2 VIEW COMPARISON:  06/24/2015 FINDINGS: Stable densities and lucency at the right lung apex. Findings could be related to scarring and a bleb at the right apex. Otherwise, both lungs are clear. Atherosclerotic calcifications at the aortic arch. The heart size is normal. No pleural effusions. No acute bone abnormality. IMPRESSION: No active cardiopulmonary disease. Aortic atherosclerosis. Electronically Signed   By: Markus Daft M.D.   On: 11/30/2015 13:19    ____________________________________________   PROCEDURES  Procedure(s) performed: None  Procedures  Critical Care performed: Yes ____________________________________________   INITIAL IMPRESSION / ASSESSMENT AND PLAN / ED COURSE  Pertinent labs & imaging results that were available during my care of the patient were reviewed by me and considered in my medical decision making (see chart for details).  55 y.o. who is not anticoagulated presenting  with postural lightheadedness, dyspnea on exertion, and laboratory studies consistent with a new anemia. The patient had blood work done in August with a hematocrit of 39.7 and a hemoglobin of 13.8; today, he is 19 and 6.9. The patient does have a pancytopenia, so I have asked the nurse to redraw the blood work to ensure that it is correct. In the meantime, I ordered typed of blood for the patient. I do not see a source of bleeding as he has no epigastric pain or discomfort, no history of peptic ulcer disease, and is rectal examination does not show any active bleeding. It is possible that he is having intermittent bleeding, although he has not noted any black stools. I'll plan to admit the patient to the hospitalist for further evaluation and treatment.  ____________________________________________  FINAL CLINICAL IMPRESSION(S)  / ED DIAGNOSES  Final diagnoses:  Symptomatic anemia  Postural lightheadedness  DOE (dyspnea on exertion)  Pancytopenia ([place])    Clinical Course       NEW MEDICATIONS STARTED DURING THIS VISIT:  New Prescriptions   No medications on file      Eula Listen, MD 11/30/15 1502

## 2015-11-30 NOTE — Telephone Encounter (Signed)
FYI Pt was transferred to the Nurse Line to be triaged, he currently has been experiencing SOB and dizziness when going upstairs or extensive work.  Pt contact [phone]

## 2015-11-30 NOTE — ED Triage Notes (Signed)
States feeling SOB x 5 days. Denies chest pain but states he feels like cant take a deep breath and increases with exertion.

## 2015-11-30 NOTE — H&P (Signed)
[facility] [place] NAME: Alexander Little    MR#:  [id number]  DATE OF BIRTH:  Jun 23, 1960  DATE OF ADMISSION:  11/30/2015  PRIMARY CARE PHYSICIAN: [name], MD   REQUESTING/REFERRING PHYSICIAN: Phineas Real  CHIEF COMPLAINT:   Chief Complaint  Patient presents with  . Shortness of Breath    HISTORY OF PRESENT ILLNESS: Alexander Little  is a 55 y.o. male with a known history of Hyperlipidemia- started having lightheadedness when walking and climbing stairs for last 7-10 days. Concerned with this he came to emergency room. He denies shortness of breath, chest pain, palpitation, loss of consciousness. He denies active bleeding. In ER noted to have pancytopenia, hemoglobin less than 7. Stool for guaiac is negative in the ER. Ordered 2 units of blood transfusion and given to hospitalist team because of symptomatic anemia. On further questioning patient admits unintentional weight loss of 37 pounds in last 1 year.  PAST MEDICAL HISTORY:   Past Medical History:  Diagnosis Date  . BPH (benign prostatic hyperplasia)   . Hyperlipidemia     PAST SURGICAL HISTORY: Past Surgical History:  Procedure Laterality Date  . CARDIAC CATHETERIZATION  2012   normal, [place]  . PROSTATE BIOPSY  2012   normal    SOCIAL HISTORY:  Social History  Substance Use Topics  . Smoking status: Current Every Day Smoker    Packs/day: 1.50    Types: Cigarettes    Last attempt to quit: 08/05/2013  . Smokeless tobacco: Never Used  . Alcohol use 0.0 oz/week    FAMILY HISTORY:  Family History  Problem Relation Age of Onset  . Stroke Mother   . Heart disease Mother   . Mental illness Mother   . COPD Mother   . Cancer Father 90    brain ca  . COPD Father   . Early death Father     DRUG ALLERGIES:  Allergies  Allergen Reactions  . Sulfa Antibiotics Rash    REVIEW OF SYSTEMS:   CONSTITUTIONAL: No fever, Positive for fatigue or weakness.  EYES: No  blurred or double vision.  EARS, NOSE, AND THROAT: No tinnitus or ear pain.  RESPIRATORY: No cough, shortness of breath, wheezing or hemoptysis.  CARDIOVASCULAR: No chest pain, orthopnea, edema.  GASTROINTESTINAL: No nausea, vomiting, diarrhea or abdominal pain.  GENITOURINARY: No dysuria, hematuria.  ENDOCRINE: No polyuria, nocturia,  HEMATOLOGY: No anemia, easy bruising or bleeding SKIN: No rash or lesion. MUSCULOSKELETAL: No joint pain or arthritis.   NEUROLOGIC: No tingling, numbness, weakness.  PSYCHIATRY: No anxiety or depression.   MEDICATIONS AT HOME:  Prior to Admission medications   Medication Sig Start Date End Date Taking? Authorizing Provider  aspirin 81 MG tablet Take 81 mg by mouth daily.   Yes Historical Provider, MD  cetirizine-pseudoephedrine (ZYRTEC-D) 5-120 MG tablet Take 1 tablet by mouth 2 (two) times daily. 03/23/15  Yes Melynda Ripple, MD  Coenzyme Q10 (COQ10) 100 MG CAPS Take by mouth.   Yes Historical Provider, MD  fluticasone (FLONASE) 50 MCG/ACT nasal spray Place 2 sprays into both nostrils daily. 11/13/14  Yes [name], MD  montelukast (SINGULAIR) 10 MG tablet Take 1 tablet (10 mg total) by mouth at bedtime. 05/20/15  Yes [name], MD  nicotine ([employer] NTS STEP 1) 21 mg/24hr patch PLACE 1 PATCH ONTO SKIN DAILY 06/22/15  Yes [name], MD  simvastatin (ZOCOR) 40 MG tablet TAKE 1 TABLET (40 MG TOTAL) BY MOUTH  DAILY AT 6 PM. 08/04/15  Yes [name], MD  tamsulosin (FLOMAX) 0.4 MG CAPS capsule TAKE 1 CAPSULE (0.4 MG TOTAL) BY MOUTH DAILY. 08/04/15  Yes [name], MD      PHYSICAL EXAMINATION:   VITAL SIGNS: Blood pressure 120/63, pulse 66, temperature 98 F (36.7 C), temperature source Oral, resp. rate 18, height 5\' 9"  (1.753 m), weight 86.2 kg (190 lb), SpO2 99 %.  GENERAL:  55 y.o.-year-old patient lying in the bed with no acute distress.  EYES: Pupils equal, round, reactive to light and accommodation. No scleral icterus. Extraocular  muscles intact. Conjunctiva pale. HEENT: Head atraumatic, normocephalic. Oropharynx and nasopharynx clear.  NECK:  Supple, no jugular venous distention. No thyroid enlargement, no tenderness.  LUNGS: Normal breath sounds bilaterally, no wheezing, rales,rhonchi or crepitation. No use of accessory muscles of respiration.  CARDIOVASCULAR: S1, S2 normal. No murmurs, rubs, or gallops.  ABDOMEN: Soft, nontender, nondistended. Bowel sounds present. No organomegaly or mass.  EXTREMITIES: No pedal edema, cyanosis, or clubbing.  NEUROLOGIC: Cranial nerves II through XII are intact. Muscle strength 5/5 in all extremities. Sensation intact. Gait not checked.  PSYCHIATRIC: The patient is alert and oriented x 3.  SKIN: No obvious rash, lesion, or ulcer.   LABORATORY PANEL:   CBC  Recent Labs Lab 11/30/15 1252 11/30/15 1519  WBC 3.5* 3.5*  HGB 6.9* 6.5*  HCT 19.9* 19.0*  PLT 77* 79*  MCV 113.7* 113.5*  MCH 39.2* 39.1*  MCHC 34.5 34.5  RDW 26.3* 26.3*   ------------------------------------------------------------------------------------------------------------------  Chemistries   Recent Labs Lab 11/30/15 1252  NA 137  K 4.0  CL 104  CO2 26  GLUCOSE 100*  BUN 11  CREATININE 0.76  CALCIUM 8.4*  AST 35  ALT 24  ALKPHOS 49  BILITOT 1.4*   ------------------------------------------------------------------------------------------------------------------ estimated creatinine clearance is 112.1 mL/min (by C-G formula based on SCr of 0.76 mg/dL). ------------------------------------------------------------------------------------------------------------------ No results for input(s): TSH, T4TOTAL, T3FREE, THYROIDAB in the last 72 hours.  Invalid input(s): FREET3   Coagulation profile  Recent Labs Lab 11/30/15 1252  INR 1.10   ------------------------------------------------------------------------------------------------------------------- No results for input(s): DDIMER in  the last 72 hours. -------------------------------------------------------------------------------------------------------------------  Cardiac Enzymes  Recent Labs Lab 11/30/15 1252  TROPONINI <0.03   ------------------------------------------------------------------------------------------------------------------ Invalid input(s): POCBNP  ---------------------------------------------------------------------------------------------------------------  Urinalysis    Component Value Date/Time   COLORURINE YELLOW (A) 08/31/2015 2016   APPEARANCEUR CLEAR (A) 08/31/2015 2016   LABSPEC 1.012 08/31/2015 2016   PHURINE 5.0 08/31/2015 2016   GLUCOSEU NEGATIVE 08/31/2015 2016   GLUCOSEU NEGATIVE 08/31/2015 1028   HGBUR NEGATIVE 08/31/2015 2016   BILIRUBINUR NEGATIVE 08/31/2015 2016   BILIRUBINUR Negative 08/31/2015 [place] 08/31/2015 2016   PROTEINUR NEGATIVE 08/31/2015 2016   UROBILINOGEN 0.2 08/31/2015 1051   UROBILINOGEN 0.2 08/31/2015 1028   NITRITE NEGATIVE 08/31/2015 2016   LEUKOCYTESUR NEGATIVE 08/31/2015 2016     RADIOLOGY: Dg Chest 2 View  Result Date: 11/30/2015 CLINICAL DATA:  Shortness breath for 1 week. EXAM: CHEST  2 VIEW COMPARISON:  06/24/2015 FINDINGS: Stable densities and lucency at the right lung apex. Findings could be related to scarring and a bleb at the right apex. Otherwise, both lungs are clear. Atherosclerotic calcifications at the aortic arch. The heart size is normal. No pleural effusions. No acute bone abnormality. IMPRESSION: No active cardiopulmonary disease. Aortic atherosclerosis. Electronically Signed   By: Markus Daft M.D.   On: 11/30/2015 13:19    EKG: Orders placed or performed during the hospital  encounter of 11/30/15  . EKG 12-Lead  . EKG 12-Lead  . ED EKG  . ED EKG    IMPRESSION AND PLAN:  * Acute symptomatic anemia, macrocytic.   Pancytopenia, thrombocytopenia.    2 units of blood transfusion ordered by ER  physician.   discussed with pt about possible side effects of blood transfusion- including more common like low grade fever to rare and serious like renal and lung involvement and infections.  He understood- and due to necessity of transfusion- agreed to receive the transfusion.     I ordered vitamin B12, folate, haptoglobin, reticulocyte count, peripheral smear, TSH.   Hematology consult.  * hyperlipidemia   Con statin.  * active smoking   councelled to quit for 4 min.   nicotin patch.  * Weight loss   Further evaluation by oncologist, stool guaiac is negative.  All the records are reviewed and case discussed with ED provider. Management plans discussed with the patient, family and they are in agreement.  CODE STATUS: full code. Code Status History    This patient does not have a recorded code status. Please follow your organizational policy for patients in this situation.       TOTAL TIME TAKING CARE OF THIS PATIENT: 50 minutes.    Vaughan Basta M.D on 11/30/2015   Between 7am to 6pm - Pager - [phone]  After 6pm go to [email or web address] - password [email or web address] [facility]  Office  [phone]  CC: Primary care physician; [name], MD   Note: This dictation was prepared with Dragon dictation along with smaller phrase technology. Any transcriptional errors that result from this process are unintentional.

## 2015-11-30 NOTE — Telephone Encounter (Signed)
Noted, will follow with patient

## 2015-11-30 NOTE — Telephone Encounter (Signed)
Patient Name: Alexander Little DOB: [birth year] Initial Comment Caller states, he is having shortness of breath and dizziness with climbing or working. Verified Nurse Assessment Nurse: Ronnald Ramp, RN, Miranda Date/Time (Eastern Time): 11/30/2015 10:01:07 AM Confirm and document reason for call. If symptomatic, describe symptoms. You must click the next button to save text entered. ---Caller states he has had SOB and dizziness with exertion for about 1 week. He has "heartburn pain" but not chest pain. Does the patient have any new or worsening symptoms? ---Yes Will a triage be completed? ---Yes Related visit to physician within the last 2 weeks? ---No Does the PT have any chronic conditions? (i.e. diabetes, asthma, etc.) ---Yes List chronic conditions. ---High Cholesterol Is this a behavioral health or substance abuse call? ---No Guidelines Guideline Title Affirmed Question Affirmed Notes Breathing Difficulty [1] MILD difficulty breathing (e.g., minimal/no SOB at rest, SOB with walking, pulse <100) AND [2] NEW-onset or WORSE than normal Final Disposition User See Physician within 4 Hours (or PCP triage) Ronnald Ramp, RN, Miranda Comments No appt available at primary office or [facility] within recommended time frame. Caller did not want to go to any other office due to distance. He will go to an UC in [place]. Referrals GO TO FACILITY UNDECIDED Disagree/Comply: Comply

## 2015-12-01 ENCOUNTER — Observation Stay: Payer: No Typology Code available for payment source

## 2015-12-01 DIAGNOSIS — D72819 Decreased white blood cell count, unspecified: Secondary | ICD-10-CM

## 2015-12-01 DIAGNOSIS — D589 Hereditary hemolytic anemia, unspecified: Secondary | ICD-10-CM | POA: Diagnosis not present

## 2015-12-01 DIAGNOSIS — Z79899 Other long term (current) drug therapy: Secondary | ICD-10-CM

## 2015-12-01 DIAGNOSIS — N400 Enlarged prostate without lower urinary tract symptoms: Secondary | ICD-10-CM | POA: Diagnosis not present

## 2015-12-01 DIAGNOSIS — D696 Thrombocytopenia, unspecified: Secondary | ICD-10-CM | POA: Diagnosis not present

## 2015-12-01 DIAGNOSIS — Z7952 Long term (current) use of systemic steroids: Secondary | ICD-10-CM

## 2015-12-01 LAB — CBC WITH DIFFERENTIAL/PLATELET
BASOS ABS: 0 10*3/uL (ref 0–0.1)
BASOS PCT: 0 %
EOS ABS: 0.1 10*3/uL (ref 0–0.7)
EOS PCT: 2 %
HCT: 24.8 % — ABNORMAL LOW (ref 40.0–52.0)
Hemoglobin: 8.8 g/dL — ABNORMAL LOW (ref 13.0–18.0)
Lymphocytes Relative: 52 %
Lymphs Abs: 1.9 10*3/uL (ref 1.0–3.6)
MCH: 37.5 pg — ABNORMAL HIGH (ref 26.0–34.0)
MCHC: 35.6 g/dL (ref 32.0–36.0)
MCV: 105.4 fL — ABNORMAL HIGH (ref 80.0–100.0)
MONO ABS: 0.1 10*3/uL — AB (ref 0.2–1.0)
Monocytes Relative: 2 %
Neutro Abs: 1.6 10*3/uL (ref 1.4–6.5)
Neutrophils Relative %: 44 %
PLATELETS: 81 10*3/uL — AB (ref 150–440)
RBC: 2.35 MIL/uL — AB (ref 4.40–5.90)
RDW: 28.9 % — AB (ref 11.5–14.5)
WBC: 3.6 10*3/uL — AB (ref 3.8–10.6)

## 2015-12-01 LAB — CBC
HCT: 23.9 % — ABNORMAL LOW (ref 40.0–52.0)
HEMOGLOBIN: 8.3 g/dL — AB (ref 13.0–18.0)
MCH: 36.7 pg — AB (ref 26.0–34.0)
MCHC: 34.8 g/dL (ref 32.0–36.0)
MCV: 105.5 fL — ABNORMAL HIGH (ref 80.0–100.0)
Platelets: 63 10*3/uL — ABNORMAL LOW (ref 150–440)
RBC: 2.26 MIL/uL — AB (ref 4.40–5.90)
RDW: 28.2 % — ABNORMAL HIGH (ref 11.5–14.5)
WBC: 3.8 10*3/uL (ref 3.8–10.6)

## 2015-12-01 LAB — TYPE AND SCREEN
ABO/RH(D): O POS
ANTIBODY SCREEN: NEGATIVE
UNIT DIVISION: 0
Unit division: 0

## 2015-12-01 LAB — BASIC METABOLIC PANEL
ANION GAP: 5 (ref 5–15)
BUN: 11 mg/dL (ref 6–20)
CALCIUM: 8.4 mg/dL — AB (ref 8.9–10.3)
CHLORIDE: 108 mmol/L (ref 101–111)
CO2: 26 mmol/L (ref 22–32)
Creatinine, Ser: 0.68 mg/dL (ref 0.61–1.24)
GFR calc non Af Amer: >60 mL/min (ref >60)
Glucose, Bld: 93 mg/dL (ref 65–99)
Potassium: 4.2 mmol/L (ref 3.5–5.1)
SODIUM: 139 mmol/L (ref 135–145)

## 2015-12-01 LAB — FIBRIN DERIVATIVES D-DIMER (ARMC ONLY): FIBRIN DERIVATIVES D-DIMER (ARMC): 2810 — AB (ref 0–499)

## 2015-12-01 LAB — PATHOLOGIST SMEAR REVIEW

## 2015-12-01 LAB — FIBRINOGEN: FIBRINOGEN: 229 mg/dL (ref 210–475)

## 2015-12-01 LAB — FIBRIN DEGRADATION PROD.(ARMC ONLY)

## 2015-12-01 LAB — HAPTOGLOBIN

## 2015-12-01 LAB — PROTIME-INR
INR: 1.1
Prothrombin Time: 14.2 seconds (ref 11.4–15.2)

## 2015-12-01 LAB — APTT: APTT: 36 s (ref 24–36)

## 2015-12-01 MED ORDER — IOPAMIDOL (ISOVUE-300) INJECTION 61%
100.0000 mL | Freq: Once | INTRAVENOUS | Status: AC | PRN
  Administered 2015-12-01: 09:00:00 100 mL via INTRAVENOUS

## 2015-12-01 MED ORDER — ENSURE ENLIVE PO LIQD
237.0000 mL | Freq: Two times a day (BID) | ORAL | Status: DC
  Administered 2015-12-01 – 2015-12-02 (×3): 237 mL via ORAL

## 2015-12-01 MED ORDER — PREDNISONE 20 MG PO TABS
80.0000 mg | ORAL_TABLET | Freq: Every day | ORAL | Status: DC
  Administered 2015-12-02: 10:00:00 80 mg via ORAL
  Filled 2015-12-01: qty 4

## 2015-12-01 MED ORDER — ACETAMINOPHEN 325 MG PO TABS
650.0000 mg | ORAL_TABLET | Freq: Four times a day (QID) | ORAL | Status: DC | PRN
  Administered 2015-12-01: 650 mg via ORAL
  Filled 2015-12-01: qty 2

## 2015-12-01 NOTE — Progress Notes (Addendum)
[place] [facility] [place] NAME: Alexander Little    MR#:  [id number]  DATE OF BIRTH:  [birth year]  SUBJECTIVE:   Patient doing very well this morning. Denies shortness of breath, dizziness or lightheaded. He states the symptoms were worse when he was walking up steps but she has not since being in the hospital  REVIEW OF SYSTEMS:    Review of Systems  Constitutional: Negative.  Negative for chills, fever and malaise/fatigue.  HENT: Negative.  Negative for ear discharge, ear pain, hearing loss, nosebleeds and sore throat.   Eyes: Negative.  Negative for blurred vision and pain.  Respiratory: Negative.  Negative for cough, hemoptysis, shortness of breath and wheezing.   Cardiovascular: Negative.  Negative for chest pain, palpitations and leg swelling.  Gastrointestinal: Negative.  Negative for abdominal pain, blood in stool, diarrhea, nausea and vomiting.  Genitourinary: Negative.  Negative for dysuria.  Musculoskeletal: Negative.  Negative for back pain.  Skin: Negative.   Neurological: Negative for dizziness, tremors, speech change, focal weakness, seizures and headaches.  Endo/Heme/Allergies: Negative.  Does not bruise/bleed easily.  Psychiatric/Behavioral: Negative.  Negative for depression, hallucinations and suicidal ideas.    Tolerating Diet: yes      DRUG ALLERGIES:   Allergies  Allergen Reactions  . Sulfa Antibiotics Rash    VITALS:  Blood pressure 106/61, pulse 61, temperature 97.9 F (36.6 C), temperature source Oral, resp. rate 18, height '5\' 9"'$  (1.753 m), weight 86.2 kg (190 lb), SpO2 100 %.  PHYSICAL EXAMINATION:   Physical Exam    LABORATORY PANEL:   CBC  Recent Labs Lab 12/01/15 0359  WBC 3.8  HGB 8.3*  HCT 23.9*  PLT 63*   ------------------------------------------------------------------------------------------------------------------  Chemistries   Recent Labs Lab 11/30/15 1252  12/01/15 0359  NA 137   --  139  K 4.0  --  4.2  CL 104  --  108  CO2 26  --  26  GLUCOSE 100*  --  93  BUN 11  --  11  CREATININE 0.76  < > 0.68  CALCIUM 8.4*  --  8.4*  AST 35  --   --   ALT 24  --   --   ALKPHOS 49  --   --   BILITOT 1.4*  --   --   < > = values in this interval not displayed. ------------------------------------------------------------------------------------------------------------------  Cardiac Enzymes  Recent Labs Lab 11/30/15 1252  TROPONINI <0.03   ------------------------------------------------------------------------------------------------------------------  RADIOLOGY:  Dg Chest 2 View  Result Date: 11/30/2015 CLINICAL DATA:  Shortness breath for 1 week. EXAM: CHEST  2 VIEW COMPARISON:  06/24/2015 FINDINGS: Stable densities and lucency at the right lung apex. Findings could be related to scarring and a bleb at the right apex. Otherwise, both lungs are clear. Atherosclerotic calcifications at the aortic arch. The heart size is normal. No pleural effusions. No acute bone abnormality. IMPRESSION: No active cardiopulmonary disease. Aortic atherosclerosis. Electronically Signed   By: Markus Daft M.D.   On: 11/30/2015 13:19   Ct Chest W Contrast  Result Date: 12/01/2015 CLINICAL DATA:  Patient with worsening dizziness, lightheadedness and shortness of breath over the past 07-2008 days. Weakness and fatigue. Weight loss. EXAM: CT CHEST, ABDOMEN, AND PELVIS WITH CONTRAST TECHNIQUE: Multidetector CT imaging of the chest, abdomen and pelvis was performed following the standard protocol during bolus administration of intravenous contrast. CONTRAST:  154m ISOVUE-300 IOPAMIDOL (ISOVUE-300) INJECTION 61% COMPARISON:  Chest radiograph 11/30/2015; chest CT 08/31/2015.  FINDINGS: CT CHEST FINDINGS Cardiovascular: Normal heart size. Trace fluid within the superior pericardial recess. Peripheral calcified atherosclerotic plaque involving the thoracic aorta. Mediastinum/Nodes: No enlarged  axillary, mediastinal or hilar lymphadenopathy. Esophagus is unremarkable. Lungs/Pleura: Central airways are patent. No consolidative or nodular pulmonary opacities. No pleural effusion or pneumothorax. No discrete pulmonary nodules are identified. Musculoskeletal: Thoracic spine degenerative changes. No aggressive or acute appearing osseous lesions. CT ABDOMEN PELVIS FINDINGS Hepatobiliary: Stable sub cm low-attenuation lesion near the caudate lobe, too small to characterize however likely representing a small cyst (image 59; series 2). Gallbladder is unremarkable. Pancreas: Unremarkable Spleen: Unremarkable Adrenals/Urinary Tract: The adrenal glands are normal. Kidneys enhance symmetrically with contrast. No hydronephrosis. Urinary bladder is unremarkable. Stomach/Bowel: Trace free fluid in the pelvis. No abnormal bowel wall thickening or evidence for bowel obstruction. Normal appendix. No free intraperitoneal air. Normal morphology of the stomach. Vascular/Lymphatic: Normal caliber abdominal aorta. Peripheral calcified atherosclerotic plaque. No retroperitoneal lymphadenopathy. Reproductive: Prostate unremarkable. Other: Small bilateral fat containing inguinal hernias. Musculoskeletal: Lumbar spine degenerative changes. No aggressive or acute appearing osseous lesions. IMPRESSION: No acute process within the chest, abdomen or pelvis. Aortic atherosclerosis. Small bilateral fat containing inguinal hernias. Electronically Signed   By: Lovey Newcomer M.D.   On: 12/01/2015 10:32   Ct Abdomen Pelvis W Contrast  Result Date: 12/01/2015 CLINICAL DATA:  Patient with worsening dizziness, lightheadedness and shortness of breath over the past 07-2008 days. Weakness and fatigue. Weight loss. EXAM: CT CHEST, ABDOMEN, AND PELVIS WITH CONTRAST TECHNIQUE: Multidetector CT imaging of the chest, abdomen and pelvis was performed following the standard protocol during bolus administration of intravenous contrast. CONTRAST:  176m  ISOVUE-300 IOPAMIDOL (ISOVUE-300) INJECTION 61% COMPARISON:  Chest radiograph 11/30/2015; chest CT 08/31/2015. FINDINGS: CT CHEST FINDINGS Cardiovascular: Normal heart size. Trace fluid within the superior pericardial recess. Peripheral calcified atherosclerotic plaque involving the thoracic aorta. Mediastinum/Nodes: No enlarged axillary, mediastinal or hilar lymphadenopathy. Esophagus is unremarkable. Lungs/Pleura: Central airways are patent. No consolidative or nodular pulmonary opacities. No pleural effusion or pneumothorax. No discrete pulmonary nodules are identified. Musculoskeletal: Thoracic spine degenerative changes. No aggressive or acute appearing osseous lesions. CT ABDOMEN PELVIS FINDINGS Hepatobiliary: Stable sub cm low-attenuation lesion near the caudate lobe, too small to characterize however likely representing a small cyst (image 59; series 2). Gallbladder is unremarkable. Pancreas: Unremarkable Spleen: Unremarkable Adrenals/Urinary Tract: The adrenal glands are normal. Kidneys enhance symmetrically with contrast. No hydronephrosis. Urinary bladder is unremarkable. Stomach/Bowel: Trace free fluid in the pelvis. No abnormal bowel wall thickening or evidence for bowel obstruction. Normal appendix. No free intraperitoneal air. Normal morphology of the stomach. Vascular/Lymphatic: Normal caliber abdominal aorta. Peripheral calcified atherosclerotic plaque. No retroperitoneal lymphadenopathy. Reproductive: Prostate unremarkable. Other: Small bilateral fat containing inguinal hernias. Musculoskeletal: Lumbar spine degenerative changes. No aggressive or acute appearing osseous lesions. IMPRESSION: No acute process within the chest, abdomen or pelvis. Aortic atherosclerosis. Small bilateral fat containing inguinal hernias. Electronically Signed   By: DLovey NewcomerM.D.   On: 12/01/2015 10:32     ASSESSMENT AND PLAN:   55year old male with a history of tobacco dependence who presents with shortness of  breath and found to have significant anemia.  1. 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  2. 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 Patient may need to undergo bone marrow biopsy Follow-up  on oncology recommendations. Consider steroids 3. Tobacco dependence patient counseled again to stop smoking Continue nicotine patch 4. Hyperlipidemia: Continue simvastatin  5. BPH: Continue Flomax Management plans discussed with the patient and he is in agreement.  CODE STATUS: full  TOTAL TIME TAKING CARE OF THIS PATIENT: 33 minutes.     POSSIBLE D/C tomorrow, DEPENDING ON CLINICAL CONDITION.   Cyrilla Durkin M.D on 12/01/2015 at 10:46 AM  Between 7am to 6pm - Pager - [phone] After 6pm go to [email or web address] - password [email or web address]  Office  [phone]  CC: Primary care physician; [name], MD  Note: This dictation was prepared with Dragon dictation along with smaller phrase technology. Any transcriptional errors that result from this process are unintentional.

## 2015-12-01 NOTE — Progress Notes (Signed)
[place]  [phone] [phone]  ID: Alexander Little. OB: [birth year]  MR#: [id number]  QMG#:[id number]  [name], [name] [name], MD (Internal Medicine)  CHIEF COMPLAINT: Hemolytic anemia, thrombocytopenia.  INTERVAL HISTORY: Patient at his baseline, offers no complaints.  REVIEW OF SYSTEMS:   Review of Systems  Constitutional: Negative for fever, malaise/fatigue and weight loss.  Respiratory: Negative.  Negative for cough and shortness of breath.   Cardiovascular: Negative.  Negative for chest pain and leg swelling.  Gastrointestinal: Negative.  Negative for blood in stool and melena.  Genitourinary: Negative.   Musculoskeletal: Negative.   Neurological: Negative.  Negative for weakness.  Endo/Heme/Allergies: Does not bruise/bleed easily.  Psychiatric/Behavioral: Negative.  The patient is not nervous/anxious.     As per HPI. Otherwise, a complete review of systems is negative.  PAST MEDICAL HISTORY: Past Medical History:  Diagnosis Date  . BPH (benign prostatic hyperplasia)   . Chest pain   . Hyperlipidemia     PAST SURGICAL HISTORY: Past Surgical History:  Procedure Laterality Date  . CARDIAC CATHETERIZATION  2012   normal, [place]  . PROSTATE BIOPSY  2012   normal    FAMILY HISTORY: Family History  Problem Relation Age of Onset  . Stroke Mother   . Heart disease Mother   . Mental illness Mother   . COPD Mother   . Cancer Father 56    brain ca  . COPD Father   . Early death Father     ADVANCED DIRECTIVES (Y/N):  '@ADVDIR'$ @  HEALTH MAINTENANCE: Social History  Substance Use Topics  . Smoking status: Current Every Day Smoker    Packs/day: 1.50    Types: Cigarettes    Last attempt to quit: 08/05/2013  . Smokeless tobacco: Never Used  . Alcohol use 0.0 oz/week     Colonoscopy:  PAP:  Bone density:  Lipid panel:  Allergies  Allergen  Reactions  . Sulfa Antibiotics Rash    Current Facility-Administered Medications  Medication Dose Route Frequency Provider Last Rate Last Dose  . acetaminophen (TYLENOL) tablet 650 mg  650 mg Oral Q6H PRN Saundra Shelling, MD   650 mg at 12/01/15 0739  . feeding supplement (ENSURE ENLIVE) (ENSURE ENLIVE) liquid 237 mL  237 mL Oral BID BM Sital Mody, MD   237 mL at 12/01/15 1343  . fluticasone (FLONASE) 50 MCG/ACT nasal spray 2 spray  2 spray Each Nare Daily Vaughan Basta, MD      . montelukast (SINGULAIR) tablet 10 mg  10 mg Oral QHS Vaughan Basta, MD   10 mg at 12/01/15 2004  . nicotine (NICODERM CQ - dosed in mg/24 hours) patch 21 mg  21 mg Transdermal Daily Vaughan Basta, MD   21 mg at 12/01/15 2004  . pneumococcal 23 valent vaccine (PNU-IMMUNE) injection 0.5 mL  0.5 mL Intramuscular Tomorrow-1000 Vaughan Basta, MD      . Derrill Memo ON 12/02/2015] predniSONE (DELTASONE) tablet 80 mg  80 mg Oral Q breakfast Sital Mody, MD      . simvastatin (ZOCOR) tablet 20 mg  20 mg Oral q1800 Vaughan Basta, MD   20 mg at 12/01/15 1807  . tamsulosin (FLOMAX) capsule 0.4 mg  0.4 mg Oral Daily Vaughan Basta, MD   0.4 mg at 12/01/15 2005    OBJECTIVE: Vitals:   12/01/15 1318 12/01/15 2041  BP: (!) 113/52 (!) 118/58  Pulse: 65 68  Resp: 20  16  Temp: 98.3 F (36.8 C) 97.8 F (36.6 C)     Body mass index is 28.06 kg/m.    ECOG FS:0 - Asymptomatic  General: Well-developed, well-nourished, no acute distress. Eyes: Pink conjunctiva, anicteric sclera. Lungs: Clear to auscultation bilaterally. Heart: Regular rate and rhythm. No rubs, murmurs, or gallops. Abdomen: Soft, nontender, nondistended. No organomegaly noted, normoactive bowel sounds. Musculoskeletal: No edema, cyanosis, or clubbing. Neuro: Alert, answering all questions appropriately. Cranial nerves grossly intact. Skin: No rashes or petechiae noted. Psych: Normal affect.   LAB RESULTS:  Lab Results    Component Value Date   NA 139 12/01/2015   K 4.2 12/01/2015   CL 108 12/01/2015   CO2 26 12/01/2015   GLUCOSE 93 12/01/2015   BUN 11 12/01/2015   CREATININE 0.68 12/01/2015   CALCIUM 8.4 (L) 12/01/2015   PROT 6.7 11/30/2015   ALBUMIN 4.3 11/30/2015   AST 35 11/30/2015   ALT 24 11/30/2015   ALKPHOS 49 11/30/2015   BILITOT 1.4 (H) 11/30/2015   GFRNONAA >60 12/01/2015   GFRAA >60 12/01/2015    Lab Results  Component Value Date   WBC 3.6 (L) 12/01/2015   NEUTROABS 1.6 12/01/2015   HGB 8.8 (L) 12/01/2015   HCT 24.8 (L) 12/01/2015   MCV 105.4 (H) 12/01/2015   PLT 81 (L) 12/01/2015     STUDIES: Dg Chest 2 View  Result Date: 11/30/2015 CLINICAL DATA:  Shortness breath for 1 week. EXAM: CHEST  2 VIEW COMPARISON:  06/24/2015 FINDINGS: Stable densities and lucency at the right lung apex. Findings could be related to scarring and a bleb at the right apex. Otherwise, both lungs are clear. Atherosclerotic calcifications at the aortic arch. The heart size is normal. No pleural effusions. No acute bone abnormality. IMPRESSION: No active cardiopulmonary disease. Aortic atherosclerosis. Electronically Signed   By: Richarda Overlie M.D.   On: 11/30/2015 13:19   Ct Chest W Contrast  Result Date: 12/01/2015 CLINICAL DATA:  Patient with worsening dizziness, lightheadedness and shortness of breath over the past 07-2008 days. Weakness and fatigue. Weight loss. EXAM: CT CHEST, ABDOMEN, AND PELVIS WITH CONTRAST TECHNIQUE: Multidetector CT imaging of the chest, abdomen and pelvis was performed following the standard protocol during bolus administration of intravenous contrast. CONTRAST:  ISOVUE-300 IOPAMIDOL (ISOVUE-300) INJECTION 61% COMPARISON:  Chest radiograph 11/30/2015; chest CT 08/31/2015. FINDINGS: CT CHEST FINDINGS Cardiovascular: Normal heart size. Trace fluid within the superior pericardial recess. Peripheral calcified atherosclerotic plaque involving the thoracic aorta. Mediastinum/Nodes:  No enlarged axillary, mediastinal or hilar lymphadenopathy. Esophagus is unremarkable. Lungs/Pleura: Central airways are patent. No consolidative or nodular pulmonary opacities. No pleural effusion or pneumothorax. No discrete pulmonary nodules are identified. Musculoskeletal: Thoracic spine degenerative changes. No aggressive or acute appearing osseous lesions. CT ABDOMEN PELVIS FINDINGS Hepatobiliary: Stable sub cm low-attenuation lesion near the caudate lobe, too small to characterize however likely representing a small cyst (image 59; series 2). Gallbladder is unremarkable. Pancreas: Unremarkable Spleen: Unremarkable Adrenals/Urinary Tract: The adrenal glands are normal. Kidneys enhance symmetrically with contrast. No hydronephrosis. Urinary bladder is unremarkable. Stomach/Bowel: Trace free fluid in the pelvis. No abnormal bowel wall thickening or evidence for bowel obstruction. Normal appendix. No free intraperitoneal air. Normal morphology of the stomach. Vascular/Lymphatic: Normal caliber abdominal aorta. Peripheral calcified atherosclerotic plaque. No retroperitoneal lymphadenopathy. Reproductive: Prostate unremarkable. Other: Small bilateral fat containing inguinal hernias. Musculoskeletal: Lumbar spine degenerative changes. No aggressive or acute appearing osseous lesions. IMPRESSION: No acute process within the chest, abdomen or pelvis. Aortic atherosclerosis. Small  bilateral fat containing inguinal hernias. Electronically Signed   By: Lovey Newcomer M.D.   On: 12/01/2015 10:32   Ct Abdomen Pelvis W Contrast  Result Date: 12/01/2015 CLINICAL DATA:  Patient with worsening dizziness, lightheadedness and shortness of breath over the past 07-2008 days. Weakness and fatigue. Weight loss. EXAM: CT CHEST, ABDOMEN, AND PELVIS WITH CONTRAST TECHNIQUE: Multidetector CT imaging of the chest, abdomen and pelvis was performed following the standard protocol during bolus administration of intravenous contrast.  CONTRAST:  152m ISOVUE-300 IOPAMIDOL (ISOVUE-300) INJECTION 61% COMPARISON:  Chest radiograph 11/30/2015; chest CT 08/31/2015. FINDINGS: CT CHEST FINDINGS Cardiovascular: Normal heart size. Trace fluid within the superior pericardial recess. Peripheral calcified atherosclerotic plaque involving the thoracic aorta. Mediastinum/Nodes: No enlarged axillary, mediastinal or hilar lymphadenopathy. Esophagus is unremarkable. Lungs/Pleura: Central airways are patent. No consolidative or nodular pulmonary opacities. No pleural effusion or pneumothorax. No discrete pulmonary nodules are identified. Musculoskeletal: Thoracic spine degenerative changes. No aggressive or acute appearing osseous lesions. CT ABDOMEN PELVIS FINDINGS Hepatobiliary: Stable sub cm low-attenuation lesion near the caudate lobe, too small to characterize however likely representing a small cyst (image 59; series 2). Gallbladder is unremarkable. Pancreas: Unremarkable Spleen: Unremarkable Adrenals/Urinary Tract: The adrenal glands are normal. Kidneys enhance symmetrically with contrast. No hydronephrosis. Urinary bladder is unremarkable. Stomach/Bowel: Trace free fluid in the pelvis. No abnormal bowel wall thickening or evidence for bowel obstruction. Normal appendix. No free intraperitoneal air. Normal morphology of the stomach. Vascular/Lymphatic: Normal caliber abdominal aorta. Peripheral calcified atherosclerotic plaque. No retroperitoneal lymphadenopathy. Reproductive: Prostate unremarkable. Other: Small bilateral fat containing inguinal hernias. Musculoskeletal: Lumbar spine degenerative changes. No aggressive or acute appearing osseous lesions. IMPRESSION: No acute process within the chest, abdomen or pelvis. Aortic atherosclerosis. Small bilateral fat containing inguinal hernias. Electronically Signed   By: DLovey NewcomerM.D.   On: 12/01/2015 10:32    ASSESSMENT: Hemolytic anemia, thrombocytopenia.  PLAN:    1. Hemolytic anemia: Unclear  etiology. CT negative for lymphoma or other malignancy. Coombs' negative. Hemoglobin improved and stable after blood transfusion. Compliment and cold agglutinins are pending.  Patient initiated on 80 mg Prednisone. Patient does not require bone marrow biopsy at this time, but would consider one in the future if no distinct etiology is determined. 2. Decreased white blood cell count: CT scans as above. Peripheral blood flow cytometry is pending. 2. Thrombocytopenia: Improving.  Does not appear to be DIC.  Steroids as above. Will consider bone marrow biopsy as above with no distinct etiology can be determined.  Will follow.  TLloyd Huger MD   12/01/2015 9:27 PM

## 2015-12-01 NOTE — Progress Notes (Signed)
Initial Nutrition Assessment  DOCUMENTATION CODES:   Not applicable  INTERVENTION:  1. Ensure Enlive po BID, each supplement provides 350 kcal and 20 grams of protein  NUTRITION DIAGNOSIS:   Inadequate oral intake related to poor appetite, other (see comment) (Early satiety) as evidenced by per patient/family report.  GOAL:   Patient will meet greater than or equal to 90% of their needs  MONITOR:   PO intake, I & O's, Labs, Weight trends  REASON FOR ASSESSMENT:   Malnutrition Screening Tool    ASSESSMENT:   Alexander Little  is a 55 y.o. male with a known history of Hyperlipidemia- started having lightheadedness when walking and climbing stairs for last 7-10 days. Concerned with this he came to emergency room. He denies shortness of breath, chest pain, palpitation, loss of consciousness. He denies active bleeding.  Spoke with Mr. Lundstrom, wife at bedside. He had an omelet and french toast for breakfast, hungry this morning.  100% PO documented. Wife states he had poor app x2 weeks, maybe longer. Pt states some days he eats well, other days he gets full quickly.  Wife states patient was recently hospitalized with a possible SBO/constipation. Appears to be from his 8/28 visit to the ED She states he was discharged from ED drinking an entire bottle of miralax Patient states he still is not having regular BMs. Reports he has lost 27# over 1 year.  Denies any nausea or vomiting today Denies any issues chewing or swallowing.  Nutrition-Focused physical exam completed. Findings are no fat depletion, no muscle depletion, and no edema.   Labs and medications reviewed  Diet Order:  Diet regular Room service appropriate? Yes; Fluid consistency: Thin  Skin:  Reviewed, no issues  Last BM:  11/27  Height:   Ht Readings from Last 1 Encounters:  11/30/15 5\' 9"  (1.753 m)    Weight:   Wt Readings from Last 1 Encounters:  11/30/15 190 lb (86.2 kg)    Ideal Body Weight:   72.72 kg  BMI:  Body mass index is 28.06 kg/m.  Estimated Nutritional Needs:   Kcal:  2150-2600 calories  Protein:  103-130 gm  Fluid:  >/= 2.2L  EDUCATION NEEDS:   No education needs identified at this time  Satira Anis. Kaylla Cobos, MS, RD LDN Inpatient Clinical Dietitian Pager [phone]

## 2015-12-02 DIAGNOSIS — N400 Enlarged prostate without lower urinary tract symptoms: Secondary | ICD-10-CM | POA: Diagnosis not present

## 2015-12-02 DIAGNOSIS — D72819 Decreased white blood cell count, unspecified: Secondary | ICD-10-CM | POA: Diagnosis not present

## 2015-12-02 DIAGNOSIS — D589 Hereditary hemolytic anemia, unspecified: Secondary | ICD-10-CM | POA: Diagnosis not present

## 2015-12-02 DIAGNOSIS — D696 Thrombocytopenia, unspecified: Secondary | ICD-10-CM | POA: Diagnosis not present

## 2015-12-02 LAB — GLUCOSE 6 PHOSPHATE DEHYDROGENASE
G6PDH: 11.2 U/g{Hb} (ref 4.6–13.5)
Hemoglobin: 8.7 g/dL — ABNORMAL LOW (ref 12.6–17.7)

## 2015-12-02 LAB — CBC
HCT: 22.5 % — ABNORMAL LOW (ref 40.0–52.0)
HEMOGLOBIN: 8 g/dL — AB (ref 13.0–18.0)
MCH: 36.9 pg — ABNORMAL HIGH (ref 26.0–34.0)
MCHC: 35.4 g/dL (ref 32.0–36.0)
MCV: 104.3 fL — ABNORMAL HIGH (ref 80.0–100.0)
PLATELETS: 64 10*3/uL — AB (ref 150–440)
RBC: 2.16 MIL/uL — ABNORMAL LOW (ref 4.40–5.90)
RDW: 28.3 % — AB (ref 11.5–14.5)
WBC: 2.7 10*3/uL — ABNORMAL LOW (ref 3.8–10.6)

## 2015-12-02 LAB — COLD AGGLUTININ TITER: Cold Agglutinin Titer: NEGATIVE

## 2015-12-02 LAB — PROTEIN ELECTROPHORESIS, SERUM
A/G RATIO SPE: 1.7 (ref 0.7–1.7)
ALPHA-2-GLOBULIN: 0.5 g/dL (ref 0.4–1.0)
Albumin ELP: 3.8 g/dL (ref 2.9–4.4)
Alpha-1-Globulin: 0.2 g/dL (ref 0.0–0.4)
Beta Globulin: 0.7 g/dL (ref 0.7–1.3)
GLOBULIN, TOTAL: 2.2 g/dL (ref 2.2–3.9)
Gamma Globulin: 0.8 g/dL (ref 0.4–1.8)
Total Protein ELP: 6 g/dL (ref 6.0–8.5)

## 2015-12-02 LAB — C4 COMPLEMENT: COMPLEMENT C4, BODY FLUID: 18 mg/dL (ref 14–44)

## 2015-12-02 LAB — IGG, IGA, IGM
IGA: 72 mg/dL — AB (ref 90–386)
IGM, SERUM: 197 mg/dL — AB (ref 20–172)
IgG (Immunoglobin G), Serum: 638 mg/dL — ABNORMAL LOW (ref 700–1600)

## 2015-12-02 LAB — C3 COMPLEMENT: C3 Complement: 83 mg/dL (ref 82–167)

## 2015-12-02 MED ORDER — SODIUM CHLORIDE 0.9 % IV SOLN
INTRAVENOUS | Status: DC
Start: 2015-12-03 — End: 2015-12-03
  Administered 2015-12-03: 06:00:00 via INTRAVENOUS

## 2015-12-02 NOTE — Progress Notes (Signed)
[facility] [place] NAME: Alexander Little    MR#:  [id number]  DATE OF BIRTH:  1960/08/11  SUBJECTIVE:   Patient doing very well this morning. Denies shortness of breath, dizziness or lightheaded. He states the symptoms were worse when he was walking up steps but she has not since being in the hospital  No complaint. REVIEW OF SYSTEMS:    Review of Systems  Constitutional: Negative.  Negative for chills, fever and malaise/fatigue.  HENT: Negative.  Negative for ear discharge, ear pain, hearing loss, nosebleeds and sore throat.   Eyes: Negative.  Negative for blurred vision and pain.  Respiratory: Negative.  Negative for cough, hemoptysis, shortness of breath and wheezing.   Cardiovascular: Negative.  Negative for chest pain, palpitations and leg swelling.  Gastrointestinal: Negative.  Negative for abdominal pain, blood in stool, diarrhea, nausea and vomiting.  Genitourinary: Negative.  Negative for dysuria.  Musculoskeletal: Negative.  Negative for back pain.  Skin: Negative.   Neurological: Negative for dizziness, tremors, speech change, focal weakness, seizures and headaches.  Endo/Heme/Allergies: Negative.  Does not bruise/bleed easily.  Psychiatric/Behavioral: Negative.  Negative for depression, hallucinations and suicidal ideas.    Tolerating Diet: yes DRUG ALLERGIES:   Allergies  Allergen Reactions  . Sulfa Antibiotics Rash    VITALS:  Blood pressure 111/60, pulse (!) 58, temperature 97.3 F (36.3 C), temperature source Oral, resp. rate 16, height '5\' 9"'$  (1.753 m), weight 190 lb (86.2 kg), SpO2 98 %.  PHYSICAL EXAMINATION:   Physical Exam  Constitutional: He is oriented to person, place, and time and well-developed, well-nourished, and in no distress.  HENT:  Head: Normocephalic.  Mouth/Throat: Oropharynx is clear and moist.  Eyes: Conjunctivae and EOM are normal. No scleral icterus.  Neck: Normal range of motion. Neck  supple. No JVD present. No tracheal deviation present.  Cardiovascular: Normal rate, regular rhythm and normal heart sounds.  Exam reveals no gallop.   No murmur heard. Pulmonary/Chest: No respiratory distress. He has no wheezes. He has no rales.  Abdominal: Soft. Bowel sounds are normal. He exhibits no distension. There is no tenderness.  Musculoskeletal: Normal range of motion. He exhibits no edema or tenderness.  Neurological: He is alert and oriented to person, place, and time. No cranial nerve deficit.  Skin: No rash noted. No erythema.  Psychiatric: Mood, memory, affect and judgment normal.      LABORATORY PANEL:   CBC  Recent Labs Lab 12/02/15 0515  WBC 2.7*  HGB 8.0*  HCT 22.5*  PLT 64*   ------------------------------------------------------------------------------------------------------------------  Chemistries   Recent Labs Lab 11/30/15 1252  12/01/15 0359  NA 137  --  139  K 4.0  --  4.2  CL 104  --  108  CO2 26  --  26  GLUCOSE 100*  --  93  BUN 11  --  11  CREATININE 0.76  < > 0.68  CALCIUM 8.4*  --  8.4*  AST 35  --   --   ALT 24  --   --   ALKPHOS 49  --   --   BILITOT 1.4*  --   --   < > = values in this interval not displayed. ------------------------------------------------------------------------------------------------------------------  Cardiac Enzymes  Recent Labs Lab 11/30/15 1252  TROPONINI <0.03   ------------------------------------------------------------------------------------------------------------------  RADIOLOGY:  Ct Chest W Contrast  Result Date: 12/01/2015 CLINICAL DATA:  Patient with worsening dizziness, lightheadedness and shortness of breath over the past 07-2008 days.  Weakness and fatigue. Weight loss. EXAM: CT CHEST, ABDOMEN, AND PELVIS WITH CONTRAST TECHNIQUE: Multidetector CT imaging of the chest, abdomen and pelvis was performed following the standard protocol during bolus administration of intravenous  contrast. CONTRAST:  126m ISOVUE-300 IOPAMIDOL (ISOVUE-300) INJECTION 61% COMPARISON:  Chest radiograph 11/30/2015; chest CT 08/31/2015. FINDINGS: CT CHEST FINDINGS Cardiovascular: Normal heart size. Trace fluid within the superior pericardial recess. Peripheral calcified atherosclerotic plaque involving the thoracic aorta. Mediastinum/Nodes: No enlarged axillary, mediastinal or hilar lymphadenopathy. Esophagus is unremarkable. Lungs/Pleura: Central airways are patent. No consolidative or nodular pulmonary opacities. No pleural effusion or pneumothorax. No discrete pulmonary nodules are identified. Musculoskeletal: Thoracic spine degenerative changes. No aggressive or acute appearing osseous lesions. CT ABDOMEN PELVIS FINDINGS Hepatobiliary: Stable sub cm low-attenuation lesion near the caudate lobe, too small to characterize however likely representing a small cyst (image 59; series 2). Gallbladder is unremarkable. Pancreas: Unremarkable Spleen: Unremarkable Adrenals/Urinary Tract: The adrenal glands are normal. Kidneys enhance symmetrically with contrast. No hydronephrosis. Urinary bladder is unremarkable. Stomach/Bowel: Trace free fluid in the pelvis. No abnormal bowel wall thickening or evidence for bowel obstruction. Normal appendix. No free intraperitoneal air. Normal morphology of the stomach. Vascular/Lymphatic: Normal caliber abdominal aorta. Peripheral calcified atherosclerotic plaque. No retroperitoneal lymphadenopathy. Reproductive: Prostate unremarkable. Other: Small bilateral fat containing inguinal hernias. Musculoskeletal: Lumbar spine degenerative changes. No aggressive or acute appearing osseous lesions. IMPRESSION: No acute process within the chest, abdomen or pelvis. Aortic atherosclerosis. Small bilateral fat containing inguinal hernias. Electronically Signed   By: DLovey NewcomerM.D.   On: 12/01/2015 10:32   Ct Abdomen Pelvis W Contrast  Result Date: 12/01/2015 CLINICAL DATA:  Patient with  worsening dizziness, lightheadedness and shortness of breath over the past 07-2008 days. Weakness and fatigue. Weight loss. EXAM: CT CHEST, ABDOMEN, AND PELVIS WITH CONTRAST TECHNIQUE: Multidetector CT imaging of the chest, abdomen and pelvis was performed following the standard protocol during bolus administration of intravenous contrast. CONTRAST:  1044mISOVUE-300 IOPAMIDOL (ISOVUE-300) INJECTION 61% COMPARISON:  Chest radiograph 11/30/2015; chest CT 08/31/2015. FINDINGS: CT CHEST FINDINGS Cardiovascular: Normal heart size. Trace fluid within the superior pericardial recess. Peripheral calcified atherosclerotic plaque involving the thoracic aorta. Mediastinum/Nodes: No enlarged axillary, mediastinal or hilar lymphadenopathy. Esophagus is unremarkable. Lungs/Pleura: Central airways are patent. No consolidative or nodular pulmonary opacities. No pleural effusion or pneumothorax. No discrete pulmonary nodules are identified. Musculoskeletal: Thoracic spine degenerative changes. No aggressive or acute appearing osseous lesions. CT ABDOMEN PELVIS FINDINGS Hepatobiliary: Stable sub cm low-attenuation lesion near the caudate lobe, too small to characterize however likely representing a small cyst (image 59; series 2). Gallbladder is unremarkable. Pancreas: Unremarkable Spleen: Unremarkable Adrenals/Urinary Tract: The adrenal glands are normal. Kidneys enhance symmetrically with contrast. No hydronephrosis. Urinary bladder is unremarkable. Stomach/Bowel: Trace free fluid in the pelvis. No abnormal bowel wall thickening or evidence for bowel obstruction. Normal appendix. No free intraperitoneal air. Normal morphology of the stomach. Vascular/Lymphatic: Normal caliber abdominal aorta. Peripheral calcified atherosclerotic plaque. No retroperitoneal lymphadenopathy. Reproductive: Prostate unremarkable. Other: Small bilateral fat containing inguinal hernias. Musculoskeletal: Lumbar spine degenerative changes. No aggressive or  acute appearing osseous lesions. IMPRESSION: No acute process within the chest, abdomen or pelvis. Aortic atherosclerosis. Small bilateral fat containing inguinal hernias. Electronically Signed   By: DrLovey Newcomer.D.   On: 12/01/2015 10:32     ASSESSMENT AND PLAN:   89ear old male with a history of tobacco dependence who presents with shortness of breath and found to have significant anemia.  1. 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 No DIC. Hemoglobin 8.0 this morning. Biopsy today and follow up CBC tomorrow per Alexander Little, oncologist.  CT abdomen, chest and pelvis with normal findings.  2. 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 Patient need to undergo bone marrow biopsy Continue steroids.  3. Tobacco dependence patient counseled again to stop smoking Continue nicotine patch 4. Hyperlipidemia: Continue simvastatin  5. BPH: Continue Flomax  I discussed with Alexander Little. Management plans discussed with the patient and he is in agreement.  CODE STATUS: full  TOTAL TIME TAKING CARE OF THIS PATIENT: 33 minutes.   POSSIBLE D/C tomorrow, DEPENDING ON CLINICAL CONDITION.   Alexander Little M.D on 12/02/2015 at 3:38 PM  Between 7am to 6pm - Pager - [phone] After 6pm go to [email or web address] - password [email or web address] [facility]  Office  [phone]  CC: Primary care physician; Alexander Mc, MD  Note: This dictation was prepared with Dragon dictation along with smaller phrase technology. Any transcriptional errors that result from this process are unintentional.

## 2015-12-02 NOTE — Progress Notes (Signed)
[facility]  [phone] [phone]  ID: Alexander Little. OB: 04-Oct-1960  MR#: [id number]  JOI#:[id number]  [name], [name] [name], MD (Internal Medicine)  CHIEF COMPLAINT: Hemolytic anemia, thrombocytopenia.  INTERVAL HISTORY: Patient at his baseline, offers no complaints. Expressing a desire to go home.  REVIEW OF SYSTEMS:   Review of Systems  Constitutional: Negative for fever, malaise/fatigue and weight loss.  Respiratory: Negative.  Negative for cough and shortness of breath.   Cardiovascular: Negative.  Negative for chest pain and leg swelling.  Gastrointestinal: Negative.  Negative for blood in stool and melena.  Genitourinary: Negative.   Musculoskeletal: Negative.   Neurological: Negative.  Negative for weakness.  Endo/Heme/Allergies: Does not bruise/bleed easily.  Psychiatric/Behavioral: Negative.  The patient is not nervous/anxious.     As per HPI. Otherwise, a complete review of systems is negative.  PAST MEDICAL HISTORY: Past Medical History:  Diagnosis Date  . BPH (benign prostatic hyperplasia)   . Chest pain   . Hyperlipidemia     PAST SURGICAL HISTORY: Past Surgical History:  Procedure Laterality Date  . CARDIAC CATHETERIZATION  2012   normal, [place]  . PROSTATE BIOPSY  2012   normal    FAMILY HISTORY: Family History  Problem Relation Age of Onset  . Stroke Mother   . Heart disease Mother   . Mental illness Mother   . COPD Mother   . Cancer Father 21    brain ca  . COPD Father   . Early death Father     ADVANCED DIRECTIVES (Y/N):  _0 @  HEALTH MAINTENANCE: Social History  Substance Use Topics  . Smoking status: Current Every Day Smoker    Packs/day: 1.50    Types: Cigarettes    Last attempt to quit: 08/05/2013  . Smokeless tobacco: Never Used  . Alcohol use 0.0 oz/week     Colonoscopy:  PAP:  Bone density:  Lipid  panel:  Allergies  Allergen Reactions  . Sulfa Antibiotics Rash    Current Facility-Administered Medications  Medication Dose Route Frequency Provider Last Rate Last Dose  . [START ON 12/03/2015] 0.9 %  sodium chloride infusion   Intravenous Continuous Markus Daft, MD      . acetaminophen (TYLENOL) tablet 650 mg  650 mg Oral Q6H PRN Saundra Shelling, MD   650 mg at 12/01/15 0739  . feeding supplement (ENSURE ENLIVE) (ENSURE ENLIVE) liquid 237 mL  237 mL Oral BID BM Sital Mody, MD   237 mL at 12/02/15 1417  . fluticasone (FLONASE) 50 MCG/ACT nasal spray 2 spray  2 spray Each Nare Daily Vaughan Basta, MD   2 spray at 12/02/15 1252  . montelukast (SINGULAIR) tablet 10 mg  10 mg Oral QHS Vaughan Basta, MD   10 mg at 12/01/15 2004  . nicotine (NICODERM CQ - dosed in mg/24 hours) patch 21 mg  21 mg Transdermal Daily Vaughan Basta, MD   21 mg at 12/02/15 0953  . simvastatin (ZOCOR) tablet 20 mg  20 mg Oral q1800 Vaughan Basta, MD   20 mg at 12/01/15 1807  . tamsulosin (FLOMAX) capsule 0.4 mg  0.4 mg Oral Daily Vaughan Basta, MD   0.4 mg at 12/02/15 0954    OBJECTIVE: Vitals:   12/02/15 0500 12/02/15 1432  BP: 122/61 111/60  Pulse: 66 (!) 58  Resp: 16 16  Temp: 97.3 F (36.3 C)      Body mass index  is 28.06 kg/m.    ECOG FS:0 - Asymptomatic  General: Well-developed, well-nourished, no acute distress. Eyes: Pink conjunctiva, anicteric sclera. Lungs: Clear to auscultation bilaterally. Heart: Regular rate and rhythm. No rubs, murmurs, or gallops. Abdomen: Soft, nontender, nondistended. No organomegaly noted, normoactive bowel sounds. Musculoskeletal: No edema, cyanosis, or clubbing. Neuro: Alert, answering all questions appropriately. Cranial nerves grossly intact. Skin: No rashes or petechiae noted. Psych: Normal affect.   LAB RESULTS:  Lab Results  Component Value Date   NA 139 12/01/2015   K 4.2 12/01/2015   CL 108 12/01/2015   CO2 26  12/01/2015   GLUCOSE 93 12/01/2015   BUN 11 12/01/2015   CREATININE 0.68 12/01/2015   CALCIUM 8.4 (L) 12/01/2015   PROT 6.7 11/30/2015   ALBUMIN 4.3 11/30/2015   AST 35 11/30/2015   ALT 24 11/30/2015   ALKPHOS 49 11/30/2015   BILITOT 1.4 (H) 11/30/2015   GFRNONAA >60 12/01/2015   GFRAA >60 12/01/2015    Lab Results  Component Value Date   WBC 2.7 (L) 12/02/2015   NEUTROABS 1.6 12/01/2015   HGB 8.0 (L) 12/02/2015   HCT 22.5 (L) 12/02/2015   MCV 104.3 (H) 12/02/2015   PLT 64 (L) 12/02/2015     STUDIES: Dg Chest 2 View  Result Date: 11/30/2015 CLINICAL DATA:  Shortness breath for 1 week. EXAM: CHEST  2 VIEW COMPARISON:  06/24/2015 FINDINGS: Stable densities and lucency at the right lung apex. Findings could be related to scarring and a bleb at the right apex. Otherwise, both lungs are clear. Atherosclerotic calcifications at the aortic arch. The heart size is normal. No pleural effusions. No acute bone abnormality. IMPRESSION: No active cardiopulmonary disease. Aortic atherosclerosis. Electronically Signed   By: Markus Daft M.D.   On: 11/30/2015 13:19   Ct Chest W Contrast  Result Date: 12/01/2015 CLINICAL DATA:  Patient with worsening dizziness, lightheadedness and shortness of breath over the past 07-2008 days. Weakness and fatigue. Weight loss. EXAM: CT CHEST, ABDOMEN, AND PELVIS WITH CONTRAST TECHNIQUE: Multidetector CT imaging of the chest, abdomen and pelvis was performed following the standard protocol during bolus administration of intravenous contrast. CONTRAST:  140m ISOVUE-300 IOPAMIDOL (ISOVUE-300) INJECTION 61% COMPARISON:  Chest radiograph 11/30/2015; chest CT 08/31/2015. FINDINGS: CT CHEST FINDINGS Cardiovascular: Normal heart size. Trace fluid within the superior pericardial recess. Peripheral calcified atherosclerotic plaque involving the thoracic aorta. Mediastinum/Nodes: No enlarged axillary, mediastinal or hilar lymphadenopathy. Esophagus is unremarkable.  Lungs/Pleura: Central airways are patent. No consolidative or nodular pulmonary opacities. No pleural effusion or pneumothorax. No discrete pulmonary nodules are identified. Musculoskeletal: Thoracic spine degenerative changes. No aggressive or acute appearing osseous lesions. CT ABDOMEN PELVIS FINDINGS Hepatobiliary: Stable sub cm low-attenuation lesion near the caudate lobe, too small to characterize however likely representing a small cyst (image 59; series 2). Gallbladder is unremarkable. Pancreas: Unremarkable Spleen: Unremarkable Adrenals/Urinary Tract: The adrenal glands are normal. Kidneys enhance symmetrically with contrast. No hydronephrosis. Urinary bladder is unremarkable. Stomach/Bowel: Trace free fluid in the pelvis. No abnormal bowel wall thickening or evidence for bowel obstruction. Normal appendix. No free intraperitoneal air. Normal morphology of the stomach. Vascular/Lymphatic: Normal caliber abdominal aorta. Peripheral calcified atherosclerotic plaque. No retroperitoneal lymphadenopathy. Reproductive: Prostate unremarkable. Other: Small bilateral fat containing inguinal hernias. Musculoskeletal: Lumbar spine degenerative changes. No aggressive or acute appearing osseous lesions. IMPRESSION: No acute process within the chest, abdomen or pelvis. Aortic atherosclerosis. Small bilateral fat containing inguinal hernias. Electronically Signed   By: DLovey NewcomerM.D.   On: 12/01/2015 10:32  Ct Abdomen Pelvis W Contrast  Result Date: 12/01/2015 CLINICAL DATA:  Patient with worsening dizziness, lightheadedness and shortness of breath over the past 07-2008 days. Weakness and fatigue. Weight loss. EXAM: CT CHEST, ABDOMEN, AND PELVIS WITH CONTRAST TECHNIQUE: Multidetector CT imaging of the chest, abdomen and pelvis was performed following the standard protocol during bolus administration of intravenous contrast. CONTRAST:  110m ISOVUE-300 IOPAMIDOL (ISOVUE-300) INJECTION 61% COMPARISON:  Chest  radiograph 11/30/2015; chest CT 08/31/2015. FINDINGS: CT CHEST FINDINGS Cardiovascular: Normal heart size. Trace fluid within the superior pericardial recess. Peripheral calcified atherosclerotic plaque involving the thoracic aorta. Mediastinum/Nodes: No enlarged axillary, mediastinal or hilar lymphadenopathy. Esophagus is unremarkable. Lungs/Pleura: Central airways are patent. No consolidative or nodular pulmonary opacities. No pleural effusion or pneumothorax. No discrete pulmonary nodules are identified. Musculoskeletal: Thoracic spine degenerative changes. No aggressive or acute appearing osseous lesions. CT ABDOMEN PELVIS FINDINGS Hepatobiliary: Stable sub cm low-attenuation lesion near the caudate lobe, too small to characterize however likely representing a small cyst (image 59; series 2). Gallbladder is unremarkable. Pancreas: Unremarkable Spleen: Unremarkable Adrenals/Urinary Tract: The adrenal glands are normal. Kidneys enhance symmetrically with contrast. No hydronephrosis. Urinary bladder is unremarkable. Stomach/Bowel: Trace free fluid in the pelvis. No abnormal bowel wall thickening or evidence for bowel obstruction. Normal appendix. No free intraperitoneal air. Normal morphology of the stomach. Vascular/Lymphatic: Normal caliber abdominal aorta. Peripheral calcified atherosclerotic plaque. No retroperitoneal lymphadenopathy. Reproductive: Prostate unremarkable. Other: Small bilateral fat containing inguinal hernias. Musculoskeletal: Lumbar spine degenerative changes. No aggressive or acute appearing osseous lesions. IMPRESSION: No acute process within the chest, abdomen or pelvis. Aortic atherosclerosis. Small bilateral fat containing inguinal hernias. Electronically Signed   By: DLovey NewcomerM.D.   On: 12/01/2015 10:32    ASSESSMENT: Hemolytic anemia, thrombocytopenia.  PLAN:    1. Hemolytic anemia: Unclear etiology. CT negative for lymphoma or other malignancy. Coombs' negative. Cold  agglutinins as well as complement levels are within normal limits. Peripheral blood flow cytometry is still pending. PNH is a possibility. Plan for bone marrow biopsy tomorrow. Prednisone has been discontinued.  2. Decreased white blood cell count: CT scans as above. Peripheral blood flow cytometry is pending. Bone marrow biopsy tomorrow. 2. Thrombocytopenia: Bone marrow biopsy as above. PNH is a possibility.  Will follow.  TLloyd Huger MD   12/02/2015 3:42 PM

## 2015-12-03 ENCOUNTER — Observation Stay: Payer: No Typology Code available for payment source

## 2015-12-03 ENCOUNTER — Telehealth: Payer: Self-pay | Admitting: Internal Medicine

## 2015-12-03 ENCOUNTER — Encounter: Payer: Self-pay | Admitting: Oncology

## 2015-12-03 LAB — DIFFERENTIAL
Basophils Absolute: 0 10*3/uL (ref 0–0.1)
Basophils Relative: 0 %
EOS PCT: 1 %
Eosinophils Absolute: 0 10*3/uL (ref 0–0.7)
LYMPHS ABS: 1.6 10*3/uL (ref 1.0–3.6)
LYMPHS PCT: 31 %
MONO ABS: 0.1 10*3/uL — AB (ref 0.2–1.0)
Monocytes Relative: 2 %
Neutro Abs: 3.4 10*3/uL (ref 1.4–6.5)
Neutrophils Relative %: 66 %

## 2015-12-03 LAB — COMP PANEL: LEUKEMIA/LYMPHOMA

## 2015-12-03 LAB — CBC
HEMATOCRIT: 24.2 % — AB (ref 40.0–52.0)
HEMOGLOBIN: 8.4 g/dL — AB (ref 13.0–18.0)
MCH: 36.8 pg — AB (ref 26.0–34.0)
MCHC: 34.9 g/dL (ref 32.0–36.0)
MCV: 105.5 fL — AB (ref 80.0–100.0)
Platelets: 69 10*3/uL — ABNORMAL LOW (ref 150–440)
RBC: 2.29 MIL/uL — AB (ref 4.40–5.90)
RDW: 28.7 % — ABNORMAL HIGH (ref 11.5–14.5)
WBC: 5 10*3/uL (ref 3.8–10.6)

## 2015-12-03 LAB — VITAMIN B1: VITAMIN B1 (THIAMINE): 92.9 nmol/L (ref 66.5–200.0)

## 2015-12-03 MED ORDER — MIDAZOLAM HCL 5 MG/5ML IJ SOLN
INTRAMUSCULAR | Status: AC | PRN
Start: 2015-12-03 — End: 2015-12-03
  Administered 2015-12-03: 1 mg via INTRAVENOUS
  Administered 2015-12-03 (×2): 0.5 mg via INTRAVENOUS
  Administered 2015-12-03: 1 mg via INTRAVENOUS

## 2015-12-03 MED ORDER — MIDAZOLAM HCL 5 MG/5ML IJ SOLN
INTRAMUSCULAR | Status: AC
  Filled 2015-12-03: qty 5

## 2015-12-03 MED ORDER — HEPARIN SOD (PORK) LOCK FLUSH 100 UNIT/ML IV SOLN
INTRAVENOUS | Status: AC
  Filled 2015-12-03: qty 5

## 2015-12-03 MED ORDER — FENTANYL CITRATE (PF) 100 MCG/2ML IJ SOLN
INTRAMUSCULAR | Status: AC | PRN
  Administered 2015-12-03: 25 ug via INTRAVENOUS
  Administered 2015-12-03: 50 ug via INTRAVENOUS
  Administered 2015-12-03: 25 ug via INTRAVENOUS

## 2015-12-03 MED ORDER — FENTANYL CITRATE (PF) 100 MCG/2ML IJ SOLN
INTRAMUSCULAR | Status: AC
  Filled 2015-12-03: qty 4

## 2015-12-03 NOTE — Telephone Encounter (Signed)
12/13 at 430.

## 2015-12-03 NOTE — Progress Notes (Signed)
Discharge instructions given and went over with patient at bedside. Work note given. All questions answered. Patient to discharge home - awaiting transportation to be provided by wife. Madlyn Frankel, RN

## 2015-12-03 NOTE — Procedures (Signed)
CT guided bone marrow biopsy from right ilium.  Two aspirates and one core obtained.  Samples deemed adequate.  No immediate complication.

## 2015-12-03 NOTE — Progress Notes (Signed)
Alexander Little at [place] was admitted to the [facility] on 11/30/2015 and Discharged  12/03/2015 and should be excused from work/school   for 7 days starting 11/30/2015 , may return to work/school without any restrictions.  Alexander Little M.D on 12/03/2015,at 12:39 PM  Alexander Little at [facility]  [phone]

## 2015-12-03 NOTE — Progress Notes (Signed)
Patient discharged. Nakeem Murnane S, RN  

## 2015-12-03 NOTE — Progress Notes (Signed)
[facility] responded to a PG for an AD. Pt was dressed and ready for discharge. Wife had just arrived. AD education had already been given. As the Pt was already discharged, he decided to complete the AD once home.   12/03/15 1300  Clinical Encounter Type  Visited With Patient;Patient and family together  Visit Type Initial;Spiritual support  Referral From Nurse  Spiritual Encounters  Spiritual Needs Literature

## 2015-12-03 NOTE — Progress Notes (Addendum)
  Subjective: Scheduled for CT guided bone marrow biopsy. Patient is asymptomatic today.  Objective: Vital signs in last 24 hours: Temp:  [97.6 F (36.4 C)-98 F (36.7 C)] 97.6 F (36.4 C) (11/30 0435) Pulse Rate:  [58-75] 70 (11/30 0935) Resp:  [12-16] 12 (11/30 0935) BP: (101-119)/(56-69) 101/60 (11/30 0935) SpO2:  [98 %-100 %] 100 % (11/30 0935) Last BM Date: 12/02/15  Intake/Output from previous day: 11/29 0701 - 11/30 0700 In: 600 [P.O.:600] Out: -  Intake/Output this shift: No intake/output data recorded.  Physical Exam: Heart: RRR Lungs: CTA Abd: soft, non tender  Lab Results:   Recent Labs  12/02/15 0515 12/03/15 0422  WBC 2.7* 5.0  HGB 8.0* 8.4*  HCT 22.5* 24.2*  PLT 64* 69*   BMET  Recent Labs  11/30/15 1252 11/30/15 1635 12/01/15 0359  NA 137  --  139  K 4.0  --  4.2  CL 104  --  108  CO2 26  --  26  GLUCOSE 100*  --  93  BUN 11  --  11  CREATININE 0.76 0.66 0.68  CALCIUM 8.4*  --  8.4*   PT/INR  Recent Labs  11/30/15 1252 12/01/15 0939  LABPROT 14.2 14.2  INR 1.10 1.10   ABG No results for input(s): PHART, HCO3 in the last 72 hours.  Invalid input(s): PCO2, PO2  Studies/Results: No results found.  Anti-infectives: Anti-infectives    None      Assessment/Plan: 55 with hemolytic anemia and scheduled for CT guided bone marrow biopsy. History and Physical has been reviewed. Informed consent for bone marrow biopsy obtained from patient. Plan for biopsy with moderate sedation.    Bethenny Losee Thurmond Butts 12/03/2015

## 2015-12-03 NOTE — Telephone Encounter (Signed)
Transition Care Management Follow-up Telephone Call  How have you been since you were released from the hospital? Pretty good, was discharged today.     Do you understand why you were in the hospital? Yes but wants to figure out the why.    Do you understand the discharge instrcutions? Yes, no questions at this time  Items Reviewed:  Medications reviewed: took away the aspirin for now, no other changes  Allergies reviewed: yes, no changes  Dietary changes reviewed: yes, no restrictions  Referrals reviewed: follow up with Dr .Grayland Ormond on the 11th, [place] on the 13th.    Functional Questionnaire:   Activities of Daily Living (ADLs):   He states they are independent in the following: indepedent States they require assistance with the following: nothing   Any transportation issues/concerns?: no concerns   Any patient concerns? Glad to found out what made all this happen, no other concerns   Confirmed importance and date/time of follow-up visits scheduled: 12/13 at 430 with Dr. Derrel Nip   Confirmed with patient if condition begins to worsen call PCP or go to the ER.  Patient was given the Call-a-Nurse line [phone]: yes, verbalized understanding.

## 2015-12-03 NOTE — Telephone Encounter (Signed)
HFU, Dx was symptomatic anemia. Pt is scheduled. Thank you!

## 2015-12-03 NOTE — Discharge Summary (Signed)
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 SECONDARY DIAGNOSIS:   Past Medical History:  Diagnosis Date  . BPH (benign prostatic hyperplasia)   . Chest pain   . Hyperlipidemia    HOSPITAL COURSE:  55 year old male with a history of tobacco dependence who presents with shortness of breath and found to have significant anemia.  1. 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 No DIC. Hemoglobin 8.4 this morning. S/P bone marrow Biopsy today, follow up Dr. Grayland Ormond, oncologist as outpatient.  CT abdomen, chest and pelvis with normal findings.  2. 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 Stable, s/p bone marrow biopsy. discontinued steroids per Dr. Grayland Ormond.  3. Tobacco dependence patient counseled again to stop smoking Continue nicotine patch 4. Hyperlipidemia: Continue simvastatin  5. BPH: Continue Flomax  DISCHARGE CONDITIONS:  Stable, discharge to home today. CONSULTS OBTAINED:  Treatment Team:  Lloyd Huger, MD DRUG ALLERGIES:   Allergies  Allergen Reactions  . Sulfa Antibiotics Rash   DISCHARGE MEDICATIONS:     Medication List    STOP taking these medications   aspirin 81 MG tablet     TAKE these medications   cetirizine-pseudoephedrine 5-120 MG tablet Commonly known as:   ZYRTEC-D Take 1 tablet by mouth 2 (two) times daily.   CoQ10 100 MG Caps Take by mouth.   fluticasone 50 MCG/ACT nasal spray Commonly known as:  FLONASE Place 2 sprays into both nostrils daily.   montelukast 10 MG tablet Commonly known as:  SINGULAIR Take 1 tablet (10 mg total) by mouth at bedtime.   nicotine 21 mg/24hr patch Commonly known as:  [employer] NTS STEP 1 PLACE 1 PATCH ONTO SKIN DAILY   simvastatin 40 MG tablet Commonly known as:  ZOCOR TAKE 1 TABLET (40 MG TOTAL) BY MOUTH DAILY AT 6 PM.   tamsulosin 0.4 MG Caps capsule Commonly known as:  FLOMAX TAKE 1 CAPSULE (0.4 MG TOTAL) BY MOUTH DAILY.        DISCHARGE INSTRUCTIONS:  See AVS.  If you experience worsening of your admission symptoms, develop shortness of breath, life threatening emergency, suicidal or homicidal thoughts you must seek medical attention immediately by calling 911 or calling your MD immediately  if symptoms less severe.  You Must read complete instructions/literature along with all the possible adverse reactions/side effects for all the Medicines you take and that have been prescribed to you. Take any new Medicines after you have completely understood and accpet all the possible adverse reactions/side effects.   Please note  You were cared for by a hospitalist during your hospital stay. If you have any questions about your discharge medications or the care you received while you were in the hospital after you are discharged, you can call the unit and asked to speak with the hospitalist on call if the hospitalist that took care of you is not available. Once you are discharged, your primary care physician will handle any further  medical issues. Please note that NO REFILLS for any discharge medications will be authorized once you are discharged, as it is imperative that you return to your primary care physician (or establish a relationship with a primary care physician if you do not have one) for your  aftercare needs so that they can reassess your need for medications and monitor your lab values.    On the day of Discharge:  VITAL SIGNS:  Blood pressure (!) 99/54, pulse 66, temperature 98.2 F (36.8 C), temperature source Oral, resp. rate 19, height _0  (1.753 m), weight 190 lb (86.2 kg), SpO2 100 %. PHYSICAL EXAMINATION:  GENERAL:  55 y.o.-year-old patient lying in the bed with no acute distress.  EYES: Pupils equal, round, reactive to light and accommodation. No scleral icterus. Extraocular muscles intact.  HEENT: Head atraumatic, normocephalic. Oropharynx and nasopharynx clear.  NECK:  Supple, no jugular venous distention. No thyroid enlargement, no tenderness.  LUNGS: Normal breath sounds bilaterally, no wheezing, rales,rhonchi or crepitation. No use of accessory muscles of respiration.  CARDIOVASCULAR: S1, S2 normal. No murmurs, rubs, or gallops.  ABDOMEN: Soft, non-tender, non-distended. Bowel sounds present. No organomegaly or mass.  EXTREMITIES: No pedal edema, cyanosis, or clubbing.  NEUROLOGIC: Cranial nerves II through XII are intact. Muscle strength 5/5 in all extremities. Sensation intact. Gait not checked.  PSYCHIATRIC: The patient is alert and oriented x 3.  SKIN: No obvious rash, lesion, or ulcer.  DATA REVIEW:   CBC  Recent Labs Lab 12/03/15 0422  WBC 5.0  HGB 8.4*  HCT 24.2*  PLT 69*    Chemistries   Recent Labs Lab 11/30/15 1252  12/01/15 0359  NA 137  --  139  K 4.0  --  4.2  CL 104  --  108  CO2 26  --  26  GLUCOSE 100*  --  93  BUN 11  --  11  CREATININE 0.76  < > 0.68  CALCIUM 8.4*  --  8.4*  AST 35  --   --   ALT 24  --   --   ALKPHOS 49  --   --   BILITOT 1.4*  --   --   < > = values in this interval not displayed.   Microbiology Results  Results for orders placed or performed during the hospital encounter of 03/23/15  Rapid Influenza A&B Antigens ([facility] only)     Status: None   Collection Time: 03/23/15  3:28 PM  Result Value Ref  Range Status   Influenza A ([facility]) NEGATIVE NEGATIVE Final   Influenza B ([facility]) NEGATIVE NEGATIVE Final  Rapid strep screen     Status: None   Collection Time: 03/23/15  4:10 PM  Result Value Ref Range Status   Streptococcus, Group A Screen ([facility]) NEGATIVE NEGATIVE Final    Comment: (NOTE) A Rapid Antigen test may result negative if the antigen level in the sample is below the detection level of this test. The FDA has not cleared this test as a stand-alone test therefore the rapid antigen negative result has reflexed to a Group A Strep culture.   Culture, group A strep     Status: None   Collection Time: 03/23/15  4:10 PM  Result Value Ref Range Status   Specimen Description THROAT  Final   Special Requests NONE Reflexed from [id number]  Final   Culture NO BETA HEMOLYTIC STREPTOCOCCI ISOLATED  Final   Report Status 03/25/2015 FINAL  Final    RADIOLOGY:  No  results found.   Management plans discussed with the patient, family and they are in agreement.  CODE STATUS:     Code Status Orders        Start     Ordered   11/30/15 1626  Full code  Continuous     11/30/15 1625    Code Status History    Date Active Date Inactive Code Status Order ID Comments User Context   This patient has a current code status but no historical code status.      TOTAL TIME TAKING CARE OF THIS PATIENT: 22 minutes.    Demetrios Loll M.D on 12/03/2015 at 1:22 PM  Between 7am to 6pm - Pager - [phone]  After 6pm go to [email or web address] - Proofreader  [facility]  Office  [phone]  CC: Primary care physician; [name], MD   Note: This dictation was prepared with Dragon dictation along with smaller phrase technology. Any transcriptional errors that result from this process are unintentional.

## 2015-12-07 ENCOUNTER — Other Ambulatory Visit: Payer: Self-pay

## 2015-12-07 ENCOUNTER — Telehealth: Payer: Self-pay | Admitting: *Deleted

## 2015-12-07 ENCOUNTER — Other Ambulatory Visit: Payer: Self-pay | Admitting: *Deleted

## 2015-12-07 ENCOUNTER — Inpatient Hospital Stay: Payer: No Typology Code available for payment source | Attending: Oncology | Admitting: Oncology

## 2015-12-07 ENCOUNTER — Other Ambulatory Visit: Payer: Self-pay | Admitting: Oncology

## 2015-12-07 DIAGNOSIS — E785 Hyperlipidemia, unspecified: Secondary | ICD-10-CM | POA: Insufficient documentation

## 2015-12-07 DIAGNOSIS — D696 Thrombocytopenia, unspecified: Secondary | ICD-10-CM | POA: Insufficient documentation

## 2015-12-07 DIAGNOSIS — D649 Anemia, unspecified: Secondary | ICD-10-CM

## 2015-12-07 DIAGNOSIS — D589 Hereditary hemolytic anemia, unspecified: Secondary | ICD-10-CM | POA: Diagnosis not present

## 2015-12-07 DIAGNOSIS — D594 Other nonautoimmune hemolytic anemias: Secondary | ICD-10-CM

## 2015-12-07 DIAGNOSIS — F17210 Nicotine dependence, cigarettes, uncomplicated: Secondary | ICD-10-CM | POA: Diagnosis not present

## 2015-12-07 DIAGNOSIS — N400 Enlarged prostate without lower urinary tract symptoms: Secondary | ICD-10-CM | POA: Insufficient documentation

## 2015-12-07 DIAGNOSIS — R5383 Other fatigue: Secondary | ICD-10-CM | POA: Insufficient documentation

## 2015-12-07 DIAGNOSIS — M47816 Spondylosis without myelopathy or radiculopathy, lumbar region: Secondary | ICD-10-CM | POA: Diagnosis not present

## 2015-12-07 DIAGNOSIS — J329 Chronic sinusitis, unspecified: Secondary | ICD-10-CM | POA: Insufficient documentation

## 2015-12-07 DIAGNOSIS — I700 Atherosclerosis of aorta: Secondary | ICD-10-CM | POA: Diagnosis not present

## 2015-12-07 DIAGNOSIS — R531 Weakness: Secondary | ICD-10-CM | POA: Insufficient documentation

## 2015-12-07 DIAGNOSIS — K4020 Bilateral inguinal hernia, without obstruction or gangrene, not specified as recurrent: Secondary | ICD-10-CM | POA: Insufficient documentation

## 2015-12-07 DIAGNOSIS — Z79899 Other long term (current) drug therapy: Secondary | ICD-10-CM | POA: Diagnosis not present

## 2015-12-07 DIAGNOSIS — E538 Deficiency of other specified B group vitamins: Secondary | ICD-10-CM | POA: Diagnosis not present

## 2015-12-07 DIAGNOSIS — Z808 Family history of malignant neoplasm of other organs or systems: Secondary | ICD-10-CM | POA: Insufficient documentation

## 2015-12-07 DIAGNOSIS — R079 Chest pain, unspecified: Secondary | ICD-10-CM | POA: Insufficient documentation

## 2015-12-07 LAB — CBC WITH DIFFERENTIAL/PLATELET
Basophils Absolute: 0 10*3/uL (ref 0–0.1)
Basophils Relative: 0 %
Eosinophils Absolute: 0.1 10*3/uL (ref 0–0.7)
Eosinophils Relative: 2 %
HEMATOCRIT: 22.3 % — AB (ref 40.0–52.0)
HEMOGLOBIN: 7.8 g/dL — AB (ref 13.0–18.0)
LYMPHS ABS: 1.6 10*3/uL (ref 1.0–3.6)
LYMPHS PCT: 38 %
MCH: 37.3 pg — AB (ref 26.0–34.0)
MCHC: 34.9 g/dL (ref 32.0–36.0)
MCV: 106.9 fL — AB (ref 80.0–100.0)
Monocytes Absolute: 0.1 10*3/uL — ABNORMAL LOW (ref 0.2–1.0)
Monocytes Relative: 2 %
NEUTROS ABS: 2.5 10*3/uL (ref 1.4–6.5)
NEUTROS PCT: 58 %
Platelets: 67 10*3/uL — ABNORMAL LOW (ref 150–440)
RBC: 2.08 MIL/uL — AB (ref 4.40–5.90)
RDW: 28 % — ABNORMAL HIGH (ref 11.5–14.5)
WBC: 4.3 10*3/uL (ref 3.8–10.6)

## 2015-12-07 LAB — SAMPLE TO BLOOD BANK

## 2015-12-07 LAB — PREPARE RBC (CROSSMATCH)

## 2015-12-07 NOTE — Telephone Encounter (Signed)
Agrees to come in now

## 2015-12-07 NOTE — Progress Notes (Signed)
[facility]  Telephone:([phone] [phone]  ID: Wynema Birch. OB: May 11, 1960  MR#: [id number]  MHD#:[id number]  [name], [name] [name], MD (Internal Medicine)  CHIEF COMPLAINT: Hemolytic anemia  INTERVAL HISTORY: Patient is a 55 year old male who was initially evaluated in Hospital to returns to clinic today for further evaluation and consideration of blood transfusion. 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 He otherwise feels well and is asymptomatic. He has no neurologic complaints he denies any recent fevers or illnesses. He is a good appetite and denies weight loss. He has no chest pain or shortness of breath. He denies any nausea, vomiting, constipation, or diarrhea. He has no melena or hematochezia. He has no urinary complaints. Patient otherwise feels well and offers no further specific complaints.  REVIEW OF SYSTEMS:   Review of Systems  Constitutional: Negative.  Negative for fever, malaise/fatigue and weight loss.  Respiratory: Negative.  Negative for cough and shortness of breath.   Cardiovascular: Negative.  Negative for chest pain and leg swelling.  Gastrointestinal: Negative.  Negative for abdominal pain, blood in stool, constipation, diarrhea and nausea.  Genitourinary: Negative.   Musculoskeletal: Negative.   Neurological: Negative.  Negative for weakness.  Psychiatric/Behavioral: Negative.  The patient is not nervous/anxious.     As per HPI. Otherwise, a complete review of systems is negative.  PAST MEDICAL HISTORY: Past Medical History:  Diagnosis Date  . BPH (benign prostatic hyperplasia)   . Chest pain   . Hyperlipidemia     PAST SURGICAL HISTORY: Past Surgical History:  Procedure Laterality Date  . CARDIAC CATHETERIZATION  2012   normal, [place]  . PROSTATE BIOPSY  2012   normal    FAMILY  HISTORY: Family History  Problem Relation Age of Onset  . Stroke Mother   . Heart disease Mother   . Mental illness Mother   . COPD Mother   . Cancer Father 64    brain ca  . COPD Father   . Early death Father     ADVANCED DIRECTIVES (Y/N):  N  HEALTH MAINTENANCE: Social History  Substance Use Topics  . Smoking status: Current Every Day Smoker    Packs/day: 1.50    Types: Cigarettes    Last attempt to quit: 08/05/2013  . Smokeless tobacco: Never Used  . Alcohol use 0.0 oz/week     Colonoscopy:  PAP:  Bone density:  Lipid panel:  Allergies  Allergen Reactions  . Sulfa Antibiotics Rash    Current Outpatient Prescriptions  Medication Sig Dispense Refill  . cetirizine-pseudoephedrine (ZYRTEC-D) 5-120 MG tablet Take 1 tablet by mouth 2 (two) times daily. (Patient taking differently: Take 1 tablet by mouth daily. ) 30 tablet 0  . Coenzyme Q10 (COQ10) 100 MG CAPS Take 100 mg by mouth daily.     . fluticasone (FLONASE) 50 MCG/ACT nasal spray Place 2 sprays into both nostrils daily. (Patient taking differently: Place 2 sprays into both nostrils as needed. ) 48 g 1  . montelukast (SINGULAIR) 10 MG tablet Take 1 tablet (10 mg total) by mouth at bedtime. 90 tablet 1  . nicotine ([employer] NTS STEP 1) 21 mg/24hr patch PLACE 1 PATCH ONTO SKIN DAILY 28 patch 11  . simvastatin (ZOCOR) 40 MG tablet TAKE 1 TABLET (40 MG TOTAL) BY MOUTH DAILY AT 6 PM. 90 tablet 1  . tamsulosin (FLOMAX) 0.4 MG  CAPS capsule TAKE 1 CAPSULE (0.4 MG TOTAL) BY MOUTH DAILY. 90 capsule 1   No current facility-administered medications for this visit.     OBJECTIVE: Vitals:   12/08/15 1007  BP: 124/70  Pulse: 64  Resp: 18  Temp: 99.3 F (37.4 C)     Body mass index is 28 kg/m.    ECOG FS:0 - Asymptomatic  General: Well-developed, well-nourished, no acute distress. Eyes: Pink conjunctiva, anicteric sclera. HEENT: Normocephalic, moist mucous membranes, clear oropharnyx. Lungs: Clear to auscultation  bilaterally. Heart: Regular rate and rhythm. No rubs, murmurs, or gallops. Abdomen: Soft, nontender, nondistended. No organomegaly noted, normoactive bowel sounds. Musculoskeletal: No edema, cyanosis, or clubbing. Neuro: Alert, answering all questions appropriately. Cranial nerves grossly intact. Skin: No rashes or petechiae noted. Psych: Normal affect. Lymphatics: No cervical, calvicular, axillary or inguinal LAD.   LAB RESULTS:  Lab Results  Component Value Date   NA 139 12/01/2015   K 4.2 12/01/2015   CL 108 12/01/2015   CO2 26 12/01/2015   GLUCOSE 93 12/01/2015   BUN 11 12/01/2015   CREATININE 0.68 12/01/2015   CALCIUM 8.4 (L) 12/01/2015   PROT 6.7 11/30/2015   ALBUMIN 4.3 11/30/2015   AST 35 11/30/2015   ALT 24 11/30/2015   ALKPHOS 49 11/30/2015   BILITOT 1.4 (H) 11/30/2015   GFRNONAA >60 12/01/2015   GFRAA >60 12/01/2015    Lab Results  Component Value Date   WBC 4.3 12/07/2015   NEUTROABS 2.5 12/07/2015   HGB 7.8 (L) 12/07/2015   HCT 22.3 (L) 12/07/2015   MCV 106.9 (H) 12/07/2015   PLT 67 (L) 12/07/2015     STUDIES: Dg Chest 2 View  Result Date: 11/30/2015 CLINICAL DATA:  Shortness breath for 1 week. EXAM: CHEST  2 VIEW COMPARISON:  06/24/2015 FINDINGS: Stable densities and lucency at the right lung apex. Findings could be related to scarring and a bleb at the right apex. Otherwise, both lungs are clear. Atherosclerotic calcifications at the aortic arch. The heart size is normal. No pleural effusions. No acute bone abnormality. IMPRESSION: No active cardiopulmonary disease. Aortic atherosclerosis. Electronically Signed   By: Markus Daft M.D.   On: 11/30/2015 13:19   Ct Chest W Contrast  Result Date: 12/01/2015 CLINICAL DATA:  Patient with worsening dizziness, lightheadedness and shortness of breath over the past 07-2008 days. Weakness and fatigue. Weight loss. EXAM: CT CHEST, ABDOMEN, AND PELVIS WITH CONTRAST TECHNIQUE: Multidetector CT imaging of the chest,  abdomen and pelvis was performed following the standard protocol during bolus administration of intravenous contrast. CONTRAST:  115m ISOVUE-300 IOPAMIDOL (ISOVUE-300) INJECTION 61% COMPARISON:  Chest radiograph 11/30/2015; chest CT 08/31/2015. FINDINGS: CT CHEST FINDINGS Cardiovascular: Normal heart size. Trace fluid within the superior pericardial recess. Peripheral calcified atherosclerotic plaque involving the thoracic aorta. Mediastinum/Nodes: No enlarged axillary, mediastinal or hilar lymphadenopathy. Esophagus is unremarkable. Lungs/Pleura: Central airways are patent. No consolidative or nodular pulmonary opacities. No pleural effusion or pneumothorax. No discrete pulmonary nodules are identified. Musculoskeletal: Thoracic spine degenerative changes. No aggressive or acute appearing osseous lesions. CT ABDOMEN PELVIS FINDINGS Hepatobiliary: Stable sub cm low-attenuation lesion near the caudate lobe, too small to characterize however likely representing a small cyst (image 59; series 2). Gallbladder is unremarkable. Pancreas: Unremarkable Spleen: Unremarkable Adrenals/Urinary Tract: The adrenal glands are normal. Kidneys enhance symmetrically with contrast. No hydronephrosis. Urinary bladder is unremarkable. Stomach/Bowel: Trace free fluid in the pelvis. No abnormal bowel wall thickening or evidence for bowel obstruction. Normal appendix. No free intraperitoneal air. Normal morphology  of the stomach. Vascular/Lymphatic: Normal caliber abdominal aorta. Peripheral calcified atherosclerotic plaque. No retroperitoneal lymphadenopathy. Reproductive: Prostate unremarkable. Other: Small bilateral fat containing inguinal hernias. Musculoskeletal: Lumbar spine degenerative changes. No aggressive or acute appearing osseous lesions. IMPRESSION: No acute process within the chest, abdomen or pelvis. Aortic atherosclerosis. Small bilateral fat containing inguinal hernias. Electronically Signed   By: Lovey Newcomer M.D.   On:  12/01/2015 10:32   Ct Abdomen Pelvis W Contrast  Result Date: 12/01/2015 CLINICAL DATA:  Patient with worsening dizziness, lightheadedness and shortness of breath over the past 07-2008 days. Weakness and fatigue. Weight loss. EXAM: CT CHEST, ABDOMEN, AND PELVIS WITH CONTRAST TECHNIQUE: Multidetector CT imaging of the chest, abdomen and pelvis was performed following the standard protocol during bolus administration of intravenous contrast. CONTRAST:  159m ISOVUE-300 IOPAMIDOL (ISOVUE-300) INJECTION 61% COMPARISON:  Chest radiograph 11/30/2015; chest CT 08/31/2015. FINDINGS: CT CHEST FINDINGS Cardiovascular: Normal heart size. Trace fluid within the superior pericardial recess. Peripheral calcified atherosclerotic plaque involving the thoracic aorta. Mediastinum/Nodes: No enlarged axillary, mediastinal or hilar lymphadenopathy. Esophagus is unremarkable. Lungs/Pleura: Central airways are patent. No consolidative or nodular pulmonary opacities. No pleural effusion or pneumothorax. No discrete pulmonary nodules are identified. Musculoskeletal: Thoracic spine degenerative changes. No aggressive or acute appearing osseous lesions. CT ABDOMEN PELVIS FINDINGS Hepatobiliary: Stable sub cm low-attenuation lesion near the caudate lobe, too small to characterize however likely representing a small cyst (image 59; series 2). Gallbladder is unremarkable. Pancreas: Unremarkable Spleen: Unremarkable Adrenals/Urinary Tract: The adrenal glands are normal. Kidneys enhance symmetrically with contrast. No hydronephrosis. Urinary bladder is unremarkable. Stomach/Bowel: Trace free fluid in the pelvis. No abnormal bowel wall thickening or evidence for bowel obstruction. Normal appendix. No free intraperitoneal air. Normal morphology of the stomach. Vascular/Lymphatic: Normal caliber abdominal aorta. Peripheral calcified atherosclerotic plaque. No retroperitoneal lymphadenopathy. Reproductive: Prostate unremarkable. Other: Small  bilateral fat containing inguinal hernias. Musculoskeletal: Lumbar spine degenerative changes. No aggressive or acute appearing osseous lesions. IMPRESSION: No acute process within the chest, abdomen or pelvis. Aortic atherosclerosis. Small bilateral fat containing inguinal hernias. Electronically Signed   By: DLovey NewcomerM.D.   On: 12/01/2015 10:32   Ct Biopsy  Result Date: 12/03/2015 INDICATION: 55year old with acute hemolytic anemia. EXAM: CT GUIDED BONE MARROW ASPIRATES AND BIOPSY Physician: AStephan Minister HAnselm Pancoast MD MEDICATIONS: None. ANESTHESIA/SEDATION: Fentanyl 3.0 mcg IV; Versed 100 mg IV Moderate Sedation Time:  16 minutes The patient was continuously monitored during the procedure by the interventional radiology nurse under my direct supervision. COMPLICATIONS: None immediate. PROCEDURE: The procedure was explained to the patient. The risks and benefits of the procedure were discussed and the patient's questions were addressed. Informed consent was obtained from the patient. The patient was placed prone on CT scan. Images of the pelvis were obtained. The right side of back was prepped and draped in sterile fashion. The skin and right posterior iliac bone were anesthetized with 1% lidocaine. 11 gauge bone needle was directed into the right iliac bone with CT guidance. Two aspirates and one core biopsy obtained. Bandage placed over the puncture site. FINDINGS: Bone needle directed to the posterior aspect of the right ilium. IMPRESSION: CT guided bone marrow aspirates and core biopsy. Electronically Signed   By: AMarkus DaftM.D.   On: 12/03/2015 16:04    ASSESSMENT: Hemolytic anemia  PLAN:    1. Hemolytic anemia: Patient was found to be significantly B-12 deficient, but otherwise the remainder of his laboratory work is either negative or within normal limits. 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 PNH studies are pending at time of dictation. Proceed with 1 unit of packed red blood cells  today. Return to clinic in 1 week for repeat laboratory work and initiation of B-12 injections. 2. Thrombocytopenia: Possibly secondary to B-12 deficiency. Monitor.  Approximately 30 minutes spent in discussion of which greater than 50% was consultation.  Patient expressed understanding and was in agreement with this plan. He also understands that He can call clinic at any time with any questions, concerns, or complaints.    Lloyd Huger, MD   12/10/2015 9:33 AM

## 2015-12-07 NOTE — Telephone Encounter (Signed)
Has appt tomorrow, but he is having difficulty breathing today asking what to do. Please advise.

## 2015-12-07 NOTE — Telephone Encounter (Signed)
Please have pt wait on results.

## 2015-12-07 NOTE — Telephone Encounter (Signed)
Per Dr. Grayland Ormond add on for lab only visit today. Labs entered.

## 2015-12-08 ENCOUNTER — Inpatient Hospital Stay: Payer: No Typology Code available for payment source

## 2015-12-08 ENCOUNTER — Inpatient Hospital Stay (HOSPITAL_BASED_OUTPATIENT_CLINIC_OR_DEPARTMENT_OTHER): Payer: No Typology Code available for payment source | Admitting: Oncology

## 2015-12-08 VITALS — BP 124/70 | HR 64 | Temp 99.3°F | Resp 18 | Wt 189.6 lb

## 2015-12-08 DIAGNOSIS — D696 Thrombocytopenia, unspecified: Secondary | ICD-10-CM | POA: Diagnosis not present

## 2015-12-08 DIAGNOSIS — E538 Deficiency of other specified B group vitamins: Secondary | ICD-10-CM | POA: Diagnosis not present

## 2015-12-08 DIAGNOSIS — D589 Hereditary hemolytic anemia, unspecified: Secondary | ICD-10-CM

## 2015-12-08 DIAGNOSIS — Z808 Family history of malignant neoplasm of other organs or systems: Secondary | ICD-10-CM

## 2015-12-08 DIAGNOSIS — I700 Atherosclerosis of aorta: Secondary | ICD-10-CM

## 2015-12-08 DIAGNOSIS — R079 Chest pain, unspecified: Secondary | ICD-10-CM

## 2015-12-08 DIAGNOSIS — Z79899 Other long term (current) drug therapy: Secondary | ICD-10-CM

## 2015-12-08 DIAGNOSIS — N400 Enlarged prostate without lower urinary tract symptoms: Secondary | ICD-10-CM

## 2015-12-08 DIAGNOSIS — R5383 Other fatigue: Secondary | ICD-10-CM

## 2015-12-08 DIAGNOSIS — R531 Weakness: Secondary | ICD-10-CM

## 2015-12-08 DIAGNOSIS — D594 Other nonautoimmune hemolytic anemias: Secondary | ICD-10-CM

## 2015-12-08 DIAGNOSIS — M47816 Spondylosis without myelopathy or radiculopathy, lumbar region: Secondary | ICD-10-CM

## 2015-12-08 DIAGNOSIS — E785 Hyperlipidemia, unspecified: Secondary | ICD-10-CM

## 2015-12-08 DIAGNOSIS — D649 Anemia, unspecified: Secondary | ICD-10-CM

## 2015-12-08 DIAGNOSIS — K4020 Bilateral inguinal hernia, without obstruction or gangrene, not specified as recurrent: Secondary | ICD-10-CM

## 2015-12-08 DIAGNOSIS — F17210 Nicotine dependence, cigarettes, uncomplicated: Secondary | ICD-10-CM

## 2015-12-08 LAB — VITAMIN B12

## 2015-12-08 MED ORDER — ACETAMINOPHEN 325 MG PO TABS
650.0000 mg | ORAL_TABLET | Freq: Once | ORAL | Status: AC
Start: 2015-12-08 — End: 2015-12-08
  Administered 2015-12-08: 650 mg via ORAL
  Filled 2015-12-08: qty 2

## 2015-12-08 MED ORDER — SODIUM CHLORIDE 0.9 % IV SOLN
250.0000 mL | Freq: Once | INTRAVENOUS | Status: AC
  Administered 2015-12-08: 250 mL via INTRAVENOUS
  Filled 2015-12-08: qty 250

## 2015-12-08 MED ORDER — DIPHENHYDRAMINE HCL 50 MG/ML IJ SOLN
25.0000 mg | Freq: Once | INTRAMUSCULAR | Status: AC
  Administered 2015-12-08: 25 mg via INTRAVENOUS
  Filled 2015-12-08: qty 1

## 2015-12-08 NOTE — Progress Notes (Signed)
Patient is here for follow up, he is saying he is a little weak feeling in his legs.

## 2015-12-09 LAB — TYPE AND SCREEN
ABO/RH(D): O POS
ANTIBODY SCREEN: NEGATIVE
UNIT DIVISION: 0

## 2015-12-10 ENCOUNTER — Telehealth: Payer: Self-pay | Admitting: *Deleted

## 2015-12-10 NOTE — Telephone Encounter (Signed)
Bone marrow results are negative.  He has B12 def. He can get a b12 shot tomorrow, might help.

## 2015-12-10 NOTE — Telephone Encounter (Signed)
Pt requests appt to see MD tomorrow morning due to having to leave work early from feeling weak and lightheaded. Pt would like to discuss results from bone marrow biopsy as well. Please advise.

## 2015-12-10 NOTE — Telephone Encounter (Signed)
Pt has several questions regarding bone marrow results. Pt requests to be seen and check labs. appt given for lab/md/b12 injection in [place] tomorrow. Pt agreed that will come back to [facility] if needs blood transfusion.

## 2015-12-11 ENCOUNTER — Inpatient Hospital Stay: Payer: No Typology Code available for payment source

## 2015-12-11 ENCOUNTER — Inpatient Hospital Stay (HOSPITAL_BASED_OUTPATIENT_CLINIC_OR_DEPARTMENT_OTHER): Payer: No Typology Code available for payment source | Admitting: Oncology

## 2015-12-11 VITALS — BP 132/68 | HR 65 | Temp 98.0°F | Resp 20 | Wt 192.9 lb

## 2015-12-11 DIAGNOSIS — E785 Hyperlipidemia, unspecified: Secondary | ICD-10-CM

## 2015-12-11 DIAGNOSIS — D696 Thrombocytopenia, unspecified: Secondary | ICD-10-CM

## 2015-12-11 DIAGNOSIS — R079 Chest pain, unspecified: Secondary | ICD-10-CM

## 2015-12-11 DIAGNOSIS — I700 Atherosclerosis of aorta: Secondary | ICD-10-CM

## 2015-12-11 DIAGNOSIS — D589 Hereditary hemolytic anemia, unspecified: Secondary | ICD-10-CM | POA: Diagnosis not present

## 2015-12-11 DIAGNOSIS — K4020 Bilateral inguinal hernia, without obstruction or gangrene, not specified as recurrent: Secondary | ICD-10-CM

## 2015-12-11 DIAGNOSIS — F17210 Nicotine dependence, cigarettes, uncomplicated: Secondary | ICD-10-CM

## 2015-12-11 DIAGNOSIS — E538 Deficiency of other specified B group vitamins: Secondary | ICD-10-CM

## 2015-12-11 DIAGNOSIS — Z79899 Other long term (current) drug therapy: Secondary | ICD-10-CM

## 2015-12-11 DIAGNOSIS — D594 Other nonautoimmune hemolytic anemias: Secondary | ICD-10-CM

## 2015-12-11 DIAGNOSIS — R5383 Other fatigue: Secondary | ICD-10-CM

## 2015-12-11 DIAGNOSIS — N400 Enlarged prostate without lower urinary tract symptoms: Secondary | ICD-10-CM

## 2015-12-11 DIAGNOSIS — R531 Weakness: Secondary | ICD-10-CM

## 2015-12-11 DIAGNOSIS — Z808 Family history of malignant neoplasm of other organs or systems: Secondary | ICD-10-CM

## 2015-12-11 DIAGNOSIS — M47816 Spondylosis without myelopathy or radiculopathy, lumbar region: Secondary | ICD-10-CM

## 2015-12-11 LAB — PNH PROFILE (-HIGH SENSITIVITY)

## 2015-12-11 LAB — SAMPLE TO BLOOD BANK

## 2015-12-11 LAB — CBC WITH DIFFERENTIAL/PLATELET
BASOS PCT: 0 %
Basophils Absolute: 0 10*3/uL (ref 0–0.1)
Eosinophils Absolute: 0.1 10*3/uL (ref 0–0.7)
Eosinophils Relative: 2 %
HEMATOCRIT: 26.1 % — AB (ref 40.0–52.0)
Hemoglobin: 9 g/dL — ABNORMAL LOW (ref 13.0–18.0)
LYMPHS PCT: 38 %
Lymphs Abs: 1.5 10*3/uL (ref 1.0–3.6)
MCH: 36.8 pg — ABNORMAL HIGH (ref 26.0–34.0)
MCHC: 34.6 g/dL (ref 32.0–36.0)
MCV: 106.2 fL — AB (ref 80.0–100.0)
MONO ABS: 0.1 10*3/uL — AB (ref 0.2–1.0)
MONOS PCT: 2 %
NEUTROS ABS: 2.3 10*3/uL (ref 1.4–6.5)
Neutrophils Relative %: 58 %
Platelets: 79 10*3/uL — ABNORMAL LOW (ref 150–440)
RBC: 2.46 MIL/uL — ABNORMAL LOW (ref 4.40–5.90)
RDW: 26.7 % — AB (ref 11.5–14.5)
WBC: 3.9 10*3/uL (ref 3.8–10.6)

## 2015-12-11 MED ORDER — CYANOCOBALAMIN 1000 MCG/ML IJ SOLN
1000.0000 ug | Freq: Once | INTRAMUSCULAR | Status: AC
  Administered 2015-12-11: 1000 ug via INTRAMUSCULAR
  Filled 2015-12-11: qty 1

## 2015-12-11 NOTE — Progress Notes (Signed)
Patient here today for follow up regarding anemia. Patient reports continued weakness.

## 2015-12-13 DIAGNOSIS — E538 Deficiency of other specified B group vitamins: Secondary | ICD-10-CM | POA: Insufficient documentation

## 2015-12-13 NOTE — Progress Notes (Signed)
[place]  [phone] [phone]  ID: Alexander Little. OB: Oct 03, 1960  MR#: [id number]  HQI#:[id number]  Patient Care Team: Alexander Mc, [name] Alexander Mc, MD (Internal Medicine)  CHIEF COMPLAINT: Hemolytic anemia, B-12 deficiency  INTERVAL HISTORY: Patient returns to clinic today for repeat laboratory work, further evaluation, and initiation of B-12 injection. He continues to feel weak and fatigued, despite receiving a blood transfusion earlier this week. He otherwise feels well and is asymptomatic. He has no neurologic complaints he denies any recent fevers or illnesses. He has a good appetite and denies weight loss. He has no chest pain or shortness of breath. He denies any nausea, vomiting, constipation, or diarrhea. He has no melena or hematochezia. He has no urinary complaints. Patient offers no further specific complaints.  REVIEW OF SYSTEMS:   Review of Systems  Constitutional: Positive for malaise/fatigue. Negative for fever and weight loss.  Respiratory: Negative.  Negative for cough and shortness of breath.   Cardiovascular: Negative.  Negative for chest pain and leg swelling.  Gastrointestinal: Negative.  Negative for abdominal pain, blood in stool, constipation, diarrhea and nausea.  Genitourinary: Negative.   Musculoskeletal: Negative.   Neurological: Positive for weakness. Negative for tingling, tremors and sensory change.  Psychiatric/Behavioral: Negative.  The patient is not nervous/anxious.     As per HPI. Otherwise, a complete review of systems is negative.  PAST MEDICAL HISTORY: Past Medical History:  Diagnosis Date  . BPH (benign prostatic hyperplasia)   . Chest pain   . Hyperlipidemia     PAST SURGICAL HISTORY: Past Surgical History:  Procedure Laterality Date  . CARDIAC CATHETERIZATION  2012   normal, [place]  . PROSTATE BIOPSY  2012   normal    FAMILY  HISTORY: Family History  Problem Relation Age of Onset  . Stroke Mother   . Heart disease Mother   . Mental illness Mother   . COPD Mother   . Cancer Father 68    brain ca  . COPD Father   . Early death Father     ADVANCED DIRECTIVES (Y/N):  N  HEALTH MAINTENANCE: Social History  Substance Use Topics  . Smoking status: Current Every Day Smoker    Packs/day: 1.50    Types: Cigarettes    Last attempt to quit: 08/05/2013  . Smokeless tobacco: Never Used  . Alcohol use 0.0 oz/week     Colonoscopy:  PAP:  Bone density:  Lipid panel:  Allergies  Allergen Reactions  . Sulfa Antibiotics Rash    Current Outpatient Prescriptions  Medication Sig Dispense Refill  . cetirizine-pseudoephedrine (ZYRTEC-D) 5-120 MG tablet Take 1 tablet by mouth 2 (two) times daily. (Patient taking differently: Take 1 tablet by mouth daily. ) 30 tablet 0  . Coenzyme Q10 (COQ10) 100 MG CAPS Take 100 mg by mouth daily.     . fluticasone (FLONASE) 50 MCG/ACT nasal spray Place 2 sprays into both nostrils daily. (Patient taking differently: Place 2 sprays into both nostrils as needed. ) 48 g 1  . montelukast (SINGULAIR) 10 MG tablet Take 1 tablet (10 mg total) by mouth at bedtime. 90 tablet 1  . nicotine ([facility] NTS STEP 1) 21 mg/24hr patch PLACE 1 PATCH ONTO SKIN DAILY 28 patch 11  . simvastatin (ZOCOR) 40 MG tablet TAKE 1 TABLET (40 MG TOTAL) BY MOUTH DAILY AT 6 PM. 90 tablet 1  . tamsulosin (FLOMAX) 0.4 MG CAPS capsule TAKE 1 CAPSULE (0.4  MG TOTAL) BY MOUTH DAILY. 90 capsule 1   No current facility-administered medications for this visit.     OBJECTIVE: Vitals:   12/11/15 0921  BP: 132/68  Pulse: 65  Resp: 20  Temp: 98 F (36.7 C)     Body mass index is 28.49 kg/m.    ECOG FS:0 - Asymptomatic  General: Well-developed, well-nourished, no acute distress. Eyes: Pink conjunctiva, anicteric sclera. Lungs: Clear to auscultation bilaterally. Heart: Regular rate and rhythm. No rubs, murmurs, or  gallops. Abdomen: Soft, nontender, nondistended. No organomegaly noted, normoactive bowel sounds. Musculoskeletal: No edema, cyanosis, or clubbing. Neuro: Alert, answering all questions appropriately. Cranial nerves grossly intact. Skin: No rashes or petechiae noted. Psych: Normal affect.   LAB RESULTS:  Lab Results  Component Value Date   NA 139 12/01/2015   K 4.2 12/01/2015   CL 108 12/01/2015   CO2 26 12/01/2015   GLUCOSE 93 12/01/2015   BUN 11 12/01/2015   CREATININE 0.68 12/01/2015   CALCIUM 8.4 (L) 12/01/2015   PROT 6.7 11/30/2015   ALBUMIN 4.3 11/30/2015   AST 35 11/30/2015   ALT 24 11/30/2015   ALKPHOS 49 11/30/2015   BILITOT 1.4 (H) 11/30/2015   GFRNONAA >60 12/01/2015   GFRAA >60 12/01/2015    Lab Results  Component Value Date   WBC 3.9 12/11/2015   NEUTROABS 2.3 12/11/2015   HGB 9.0 (L) 12/11/2015   HCT 26.1 (L) 12/11/2015   MCV 106.2 (H) 12/11/2015   PLT 79 (L) 12/11/2015     STUDIES: Dg Chest 2 View  Result Date: 11/30/2015 CLINICAL DATA:  Shortness breath for 1 week. EXAM: CHEST  2 VIEW COMPARISON:  06/24/2015 FINDINGS: Stable densities and lucency at the right lung apex. Findings could be related to scarring and a bleb at the right apex. Otherwise, both lungs are clear. Atherosclerotic calcifications at the aortic arch. The heart size is normal. No pleural effusions. No acute bone abnormality. IMPRESSION: No active cardiopulmonary disease. Aortic atherosclerosis. Electronically Signed   By: Richarda Overlie M.D.   On: 11/30/2015 13:19   Ct Chest W Contrast  Result Date: 12/01/2015 CLINICAL DATA:  Patient with worsening dizziness, lightheadedness and shortness of breath over the past 07-2008 days. Weakness and fatigue. Weight loss. EXAM: CT CHEST, ABDOMEN, AND PELVIS WITH CONTRAST TECHNIQUE: Multidetector CT imaging of the chest, abdomen and pelvis was performed following the standard protocol during bolus administration of intravenous contrast. CONTRAST:   ISOVUE-300 IOPAMIDOL (ISOVUE-300) INJECTION 61% COMPARISON:  Chest radiograph 11/30/2015; chest CT 08/31/2015. FINDINGS: CT CHEST FINDINGS Cardiovascular: Normal heart size. Trace fluid within the superior pericardial recess. Peripheral calcified atherosclerotic plaque involving the thoracic aorta. Mediastinum/Nodes: No enlarged axillary, mediastinal or hilar lymphadenopathy. Esophagus is unremarkable. Lungs/Pleura: Central airways are patent. No consolidative or nodular pulmonary opacities. No pleural effusion or pneumothorax. No discrete pulmonary nodules are identified. Musculoskeletal: Thoracic spine degenerative changes. No aggressive or acute appearing osseous lesions. CT ABDOMEN PELVIS FINDINGS Hepatobiliary: Stable sub cm low-attenuation lesion near the caudate lobe, too small to characterize however likely representing a small cyst (image 59; series 2). Gallbladder is unremarkable. Pancreas: Unremarkable Spleen: Unremarkable Adrenals/Urinary Tract: The adrenal glands are normal. Kidneys enhance symmetrically with contrast. No hydronephrosis. Urinary bladder is unremarkable. Stomach/Bowel: Trace free fluid in the pelvis. No abnormal bowel wall thickening or evidence for bowel obstruction. Normal appendix. No free intraperitoneal air. Normal morphology of the stomach. Vascular/Lymphatic: Normal caliber abdominal aorta. Peripheral calcified atherosclerotic plaque. No retroperitoneal lymphadenopathy. Reproductive: Prostate unremarkable. Other: Small bilateral  fat containing inguinal hernias. Musculoskeletal: Lumbar spine degenerative changes. No aggressive or acute appearing osseous lesions. IMPRESSION: No acute process within the chest, abdomen or pelvis. Aortic atherosclerosis. Small bilateral fat containing inguinal hernias. Electronically Signed   By: Lovey Newcomer M.D.   On: 12/01/2015 10:32   Ct Abdomen Pelvis W Contrast  Result Date: 12/01/2015 CLINICAL DATA:  Patient with worsening dizziness,  lightheadedness and shortness of breath over the past 07-2008 days. Weakness and fatigue. Weight loss. EXAM: CT CHEST, ABDOMEN, AND PELVIS WITH CONTRAST TECHNIQUE: Multidetector CT imaging of the chest, abdomen and pelvis was performed following the standard protocol during bolus administration of intravenous contrast. CONTRAST:  168m ISOVUE-300 IOPAMIDOL (ISOVUE-300) INJECTION 61% COMPARISON:  Chest radiograph 11/30/2015; chest CT 08/31/2015. FINDINGS: CT CHEST FINDINGS Cardiovascular: Normal heart size. Trace fluid within the superior pericardial recess. Peripheral calcified atherosclerotic plaque involving the thoracic aorta. Mediastinum/Nodes: No enlarged axillary, mediastinal or hilar lymphadenopathy. Esophagus is unremarkable. Lungs/Pleura: Central airways are patent. No consolidative or nodular pulmonary opacities. No pleural effusion or pneumothorax. No discrete pulmonary nodules are identified. Musculoskeletal: Thoracic spine degenerative changes. No aggressive or acute appearing osseous lesions. CT ABDOMEN PELVIS FINDINGS Hepatobiliary: Stable sub cm low-attenuation lesion near the caudate lobe, too small to characterize however likely representing a small cyst (image 59; series 2). Gallbladder is unremarkable. Pancreas: Unremarkable Spleen: Unremarkable Adrenals/Urinary Tract: The adrenal glands are normal. Kidneys enhance symmetrically with contrast. No hydronephrosis. Urinary bladder is unremarkable. Stomach/Bowel: Trace free fluid in the pelvis. No abnormal bowel wall thickening or evidence for bowel obstruction. Normal appendix. No free intraperitoneal air. Normal morphology of the stomach. Vascular/Lymphatic: Normal caliber abdominal aorta. Peripheral calcified atherosclerotic plaque. No retroperitoneal lymphadenopathy. Reproductive: Prostate unremarkable. Other: Small bilateral fat containing inguinal hernias. Musculoskeletal: Lumbar spine degenerative changes. No aggressive or acute appearing  osseous lesions. IMPRESSION: No acute process within the chest, abdomen or pelvis. Aortic atherosclerosis. Small bilateral fat containing inguinal hernias. Electronically Signed   By: DLovey NewcomerM.D.   On: 12/01/2015 10:32   Ct Biopsy  Result Date: 12/03/2015 INDICATION: 55year old with acute hemolytic anemia. EXAM: CT GUIDED BONE MARROW ASPIRATES AND BIOPSY Physician: AStephan Minister HAnselm Pancoast MD MEDICATIONS: None. ANESTHESIA/SEDATION: Fentanyl 3.0 mcg IV; Versed 100 mg IV Moderate Sedation Time:  16 minutes The patient was continuously monitored during the procedure by the interventional radiology nurse under my direct supervision. COMPLICATIONS: None immediate. PROCEDURE: The procedure was explained to the patient. The risks and benefits of the procedure were discussed and the patient's questions were addressed. Informed consent was obtained from the patient. The patient was placed prone on CT scan. Images of the pelvis were obtained. The right side of back was prepped and draped in sterile fashion. The skin and right posterior iliac bone were anesthetized with 1% lidocaine. 11 gauge bone needle was directed into the right iliac bone with CT guidance. Two aspirates and one core biopsy obtained. Bandage placed over the puncture site. FINDINGS: Bone needle directed to the posterior aspect of the right ilium. IMPRESSION: CT guided bone marrow aspirates and core biopsy. Electronically Signed   By: AMarkus DaftM.D.   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 Hemolytic anemia: Patient was found to be significantly B-12 deficient, but otherwise the remainder of his laboratory work is either negative or within normal limits. 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 PNH studies are pending at time of dictation. Patient does not require additional transfusion today, but  will initiate 1000 g intramuscular B-12. Return to clinic in 2 weeks for repeat laboratory work  and further evaluation. 2. Thrombocytopenia: Possibly secondary to B-12 deficiency. Monitor. 3. B-12 deficiency: Unclear etiology. Will order intrinsic factor and consider GI referral.  Approximately 30 minutes spent in discussion of which greater than 50% was consultation.  Patient expressed understanding and was in agreement with this plan. He also understands that He can call clinic at any time with any questions, concerns, or complaints.    Lloyd Huger, MD   12/13/2015 12:24 PM

## 2015-12-14 ENCOUNTER — Inpatient Hospital Stay: Payer: No Typology Code available for payment source | Admitting: Oncology

## 2015-12-14 LAB — COMP PANEL: LEUKEMIA/LYMPHOMA

## 2015-12-14 NOTE — Progress Notes (Signed)
This encounter was created in error - please disregard.

## 2015-12-15 ENCOUNTER — Inpatient Hospital Stay: Payer: No Typology Code available for payment source

## 2015-12-15 ENCOUNTER — Inpatient Hospital Stay: Payer: No Typology Code available for payment source | Admitting: Oncology

## 2015-12-16 ENCOUNTER — Ambulatory Visit (INDEPENDENT_AMBULATORY_CARE_PROVIDER_SITE_OTHER): Payer: No Typology Code available for payment source | Admitting: Internal Medicine

## 2015-12-16 ENCOUNTER — Encounter: Payer: Self-pay | Admitting: Internal Medicine

## 2015-12-16 DIAGNOSIS — E538 Deficiency of other specified B group vitamins: Secondary | ICD-10-CM

## 2015-12-16 DIAGNOSIS — Z09 Encounter for follow-up examination after completed treatment for conditions other than malignant neoplasm: Secondary | ICD-10-CM

## 2015-12-16 NOTE — Progress Notes (Signed)
Pre-visit discussion using our clinic review tool. No additional management support is needed unless otherwise documented below in the visit note.  

## 2015-12-16 NOTE — Patient Instructions (Addendum)
I'm glad you are feeling better!  You may be unable to absorb  B12 from your diet (pernicious anemia) ; the intrinsic factor test will help determine this   See if the eugenics supplements have cyanocobalamin (B12 )  In them  take the bottle to Dr.  Grayland Ormond to let him review the ingredients

## 2015-12-16 NOTE — Progress Notes (Signed)
Subjective:  Patient ID: Alexander Birch., male    DOB: 10/15/1960  Age: 55 y.o. MRN: [id number]  CC: Diagnoses of Hospital discharge follow-up and B12 deficiency were pertinent to this visit.  HPI Alexander Little. presents for hospital follow up.  Patient was was admitted to [facility] on Nov 27th with symptomatic pancytopenia,    hgb 7.8  Accompanied by unintentional weight loss. 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    High dose steroids and blood transufsions were given intially for hgb < 7,  Also diagnosed with severe b12 deficiency (<50) .  he was transfused  2 units,  Underwent Bone marrow biopsy done by fninegan  Was consistent with megaloblastic anemia . Treated with steroids initially   2 units , and discharged on Nov 30th.   3rd unit transfused at cancer center on dec 8th,  And b12 shot given    Intrinsic factor ordered.  getting weekly B12 injections    Stopped the Eugenics supplements that he started In May Outpatient Medications Prior to Visit  Medication Sig Dispense Refill  . cetirizine-pseudoephedrine (ZYRTEC-D) 5-120 MG tablet Take 1 tablet by mouth 2 (two) times daily. (Patient taking differently: Take 1 tablet by mouth daily. ) 30 tablet 0  . Coenzyme Q10 (COQ10) 100 MG CAPS Take 100 mg by mouth daily.     . fluticasone (FLONASE) 50 MCG/ACT nasal spray Place 2 sprays into both nostrils daily. (Patient taking differently: Place 2 sprays into both nostrils as needed. ) 48 g 1  . montelukast (SINGULAIR) 10 MG tablet Take 1 tablet (10 mg total) by mouth at bedtime. 90 tablet 1  . nicotine ([facility] NTS STEP 1) 21 mg/24hr patch PLACE 1 PATCH ONTO SKIN DAILY 28 patch 11  . simvastatin (ZOCOR) 40 MG tablet TAKE 1 TABLET (40 MG TOTAL) BY MOUTH DAILY AT 6 PM. 90 tablet 1  . tamsulosin (FLOMAX) 0.4 MG CAPS capsule TAKE 1 CAPSULE (0.4 MG TOTAL) BY MOUTH DAILY. 90 capsule 1   No facility-administered medications prior to visit.      Review of Systems;  Patient denies headache, fevers, malaise, unintentional weight loss, skin rash, eye pain, sinus congestion and sinus pain, sore throat, dysphagia,  hemoptysis , cough, dyspnea, wheezing, chest pain, palpitations, orthopnea, edema, abdominal pain, nausea, melena, diarrhea, constipation, flank pain, dysuria, hematuria, urinary  Frequency, nocturia, numbness, tingling, seizures,  Focal weakness, Loss of consciousness,  Tremor, insomnia, depression, anxiety, and suicidal ideation.      Objective:  BP 118/68   Pulse (!) 52   Temp 98.2 F (36.8 C) (Oral)   Resp 12   Ht _0  (1.753 m)   Wt 196 lb 4 oz (89 kg)   SpO2 96%   BMI 28.98 kg/m   BP Readings from Last 3 Encounters:  12/16/15 118/68  12/11/15 132/68  12/08/15 (!) 147/72    Wt Readings from Last 3 Encounters:  12/16/15 196 lb 4 oz (89 kg)  12/11/15 192 lb 14.4 oz (87.5 kg)  12/08/15 189 lb 9.5 oz (86 kg)    General appearance: alert, cooperative and appears stated age Ears: normal TM's and external ear canals both ears Throat: lips, mucosa, and tongue normal; teeth and gums normal Neck: no adenopathy, no carotid bruit, supple, symmetrical, trachea midline and thyroid not enlarged, symmetric, no tenderness/mass/nodules Back: symmetric, no curvature. ROM normal. No CVA tenderness. Lungs: clear to auscultation bilaterally Heart: regular rate and rhythm, S1,  S2 normal, no murmur, click, rub or gallop Abdomen: soft, non-tender; bowel sounds normal; no masses,  no organomegaly Pulses: 2+ and symmetric Skin: Skin color, texture, turgor normal. No rashes or lesions Lymph nodes: Cervical, supraclavicular, and axillary nodes normal.  No results found for: HGBA1C  Lab Results  Component Value Date   CREATININE 0.68 12/01/2015   CREATININE 0.66 11/30/2015   CREATININE 0.76 11/30/2015    Lab Results  Component Value Date   WBC 3.9 12/11/2015   HGB 9.0 (L) 12/11/2015   HCT 26.1 (L) 12/11/2015   PLT  79 (L) 12/11/2015   GLUCOSE 93 12/01/2015   CHOL 174 01/15/2014   TRIG 225.0 (H) 01/15/2014   HDL 37.70 (L) 01/15/2014   LDLDIRECT 98.0 06/22/2015   LDLCALC 100 (H) 01/14/2013   ALT 24 11/30/2015   AST 35 11/30/2015   NA 139 12/01/2015   K 4.2 12/01/2015   CL 108 12/01/2015   CREATININE 0.68 12/01/2015   BUN 11 12/01/2015   CO2 26 12/01/2015   TSH 3.856 11/30/2015   PSA 0.30 06/22/2015   INR 1.10 12/01/2015    Dg Chest 2 View  Result Date: 11/30/2015 CLINICAL DATA:  Shortness breath for 1 week. EXAM: CHEST  2 VIEW COMPARISON:  06/24/2015 FINDINGS: Stable densities and lucency at the right lung apex. Findings could be related to scarring and a bleb at the right apex. Otherwise, both lungs are clear. Atherosclerotic calcifications at the aortic arch. The heart size is normal. No pleural effusions. No acute bone abnormality. IMPRESSION: No active cardiopulmonary disease. Aortic atherosclerosis. Electronically Signed   By: Markus Daft M.D.   On: 11/30/2015 13:19   Ct Chest W Contrast  Result Date: 12/01/2015 CLINICAL DATA:  Patient with worsening dizziness, lightheadedness and shortness of breath over the past 07-2008 days. Weakness and fatigue. Weight loss. EXAM: CT CHEST, ABDOMEN, AND PELVIS WITH CONTRAST TECHNIQUE: Multidetector CT imaging of the chest, abdomen and pelvis was performed following the standard protocol during bolus administration of intravenous contrast. CONTRAST:  144m ISOVUE-300 IOPAMIDOL (ISOVUE-300) INJECTION 61% COMPARISON:  Chest radiograph 11/30/2015; chest CT 08/31/2015. FINDINGS: CT CHEST FINDINGS Cardiovascular: Normal heart size. Trace fluid within the superior pericardial recess. Peripheral calcified atherosclerotic plaque involving the thoracic aorta. Mediastinum/Nodes: No enlarged axillary, mediastinal or hilar lymphadenopathy. Esophagus is unremarkable. Lungs/Pleura: Central airways are patent. No consolidative or nodular pulmonary opacities. No pleural  effusion or pneumothorax. No discrete pulmonary nodules are identified. Musculoskeletal: Thoracic spine degenerative changes. No aggressive or acute appearing osseous lesions. CT ABDOMEN PELVIS FINDINGS Hepatobiliary: Stable sub cm low-attenuation lesion near the caudate lobe, too small to characterize however likely representing a small cyst (image 59; series 2). Gallbladder is unremarkable. Pancreas: Unremarkable Spleen: Unremarkable Adrenals/Urinary Tract: The adrenal glands are normal. Kidneys enhance symmetrically with contrast. No hydronephrosis. Urinary bladder is unremarkable. Stomach/Bowel: Trace free fluid in the pelvis. No abnormal bowel wall thickening or evidence for bowel obstruction. Normal appendix. No free intraperitoneal air. Normal morphology of the stomach. Vascular/Lymphatic: Normal caliber abdominal aorta. Peripheral calcified atherosclerotic plaque. No retroperitoneal lymphadenopathy. Reproductive: Prostate unremarkable. Other: Small bilateral fat containing inguinal hernias. Musculoskeletal: Lumbar spine degenerative changes. No aggressive or acute appearing osseous lesions. IMPRESSION: No acute process within the chest, abdomen or pelvis. Aortic atherosclerosis. Small bilateral fat containing inguinal hernias. Electronically Signed   By: DLovey NewcomerM.D.   On: 12/01/2015 10:32   Ct Abdomen Pelvis W Contrast  Result Date: 12/01/2015 CLINICAL DATA:  Patient with worsening dizziness, lightheadedness and shortness of  breath over the past 07-2008 days. Weakness and fatigue. Weight loss. EXAM: CT CHEST, ABDOMEN, AND PELVIS WITH CONTRAST TECHNIQUE: Multidetector CT imaging of the chest, abdomen and pelvis was performed following the standard protocol during bolus administration of intravenous contrast. CONTRAST:  188m ISOVUE-300 IOPAMIDOL (ISOVUE-300) INJECTION 61% COMPARISON:  Chest radiograph 11/30/2015; chest CT 08/31/2015. FINDINGS: CT CHEST FINDINGS Cardiovascular: Normal heart size.  Trace fluid within the superior pericardial recess. Peripheral calcified atherosclerotic plaque involving the thoracic aorta. Mediastinum/Nodes: No enlarged axillary, mediastinal or hilar lymphadenopathy. Esophagus is unremarkable. Lungs/Pleura: Central airways are patent. No consolidative or nodular pulmonary opacities. No pleural effusion or pneumothorax. No discrete pulmonary nodules are identified. Musculoskeletal: Thoracic spine degenerative changes. No aggressive or acute appearing osseous lesions. CT ABDOMEN PELVIS FINDINGS Hepatobiliary: Stable sub cm low-attenuation lesion near the caudate lobe, too small to characterize however likely representing a small cyst (image 59; series 2). Gallbladder is unremarkable. Pancreas: Unremarkable Spleen: Unremarkable Adrenals/Urinary Tract: The adrenal glands are normal. Kidneys enhance symmetrically with contrast. No hydronephrosis. Urinary bladder is unremarkable. Stomach/Bowel: Trace free fluid in the pelvis. No abnormal bowel wall thickening or evidence for bowel obstruction. Normal appendix. No free intraperitoneal air. Normal morphology of the stomach. Vascular/Lymphatic: Normal caliber abdominal aorta. Peripheral calcified atherosclerotic plaque. No retroperitoneal lymphadenopathy. Reproductive: Prostate unremarkable. Other: Small bilateral fat containing inguinal hernias. Musculoskeletal: Lumbar spine degenerative changes. No aggressive or acute appearing osseous lesions. IMPRESSION: No acute process within the chest, abdomen or pelvis. Aortic atherosclerosis. Small bilateral fat containing inguinal hernias. Electronically Signed   By: DLovey NewcomerM.D.   On: 12/01/2015 10:32    Assessment & Plan:   Problem List Items Addressed This Visit    B12 deficiency    Starting injections at hematology ntrisinc factor pending       Hospital discharge follow-up    Patient is stable post discharge and has no new issues or questions about his discharge plans          I am having Mr. RKhimmaintain his fluticasone, cetirizine-pseudoephedrine, montelukast, nicotine, simvastatin, tamsulosin, CoQ10, and cyanocobalamin.  Meds ordered this encounter  Medications  . cyanocobalamin (,VITAMIN B-12,) 1000 MCG/ML injection    Sig: Inject 1,000 mcg into the muscle every 14 (fourteen) days.    There are no discontinued medications.  Follow-up: Return in about 6 months (around 06/15/2016).   TCrecencio Mc MD

## 2015-12-18 DIAGNOSIS — Z09 Encounter for follow-up examination after completed treatment for conditions other than malignant neoplasm: Secondary | ICD-10-CM | POA: Insufficient documentation

## 2015-12-18 NOTE — Assessment & Plan Note (Signed)
Starting injections at hematology ntrisinc factor pending

## 2015-12-18 NOTE — Assessment & Plan Note (Signed)
Patient is stable post discharge and has no new issues or questions about his discharge plans 

## 2015-12-21 ENCOUNTER — Inpatient Hospital Stay: Payer: No Typology Code available for payment source

## 2015-12-21 ENCOUNTER — Telehealth: Payer: Self-pay | Admitting: *Deleted

## 2015-12-21 NOTE — Telephone Encounter (Signed)
Per Dr Grayland Ormond, can move appt if appt available. No appt slots available, but pt will come in to have labs checked today and keep MD appt for Thursday

## 2015-12-21 NOTE — Telephone Encounter (Signed)
Not feeling well, thinks his HGB has dropped and would like to move his appt up to ASAP from 12/21. Please advise

## 2015-12-22 ENCOUNTER — Ambulatory Visit: Payer: No Typology Code available for payment source | Admitting: Family Medicine

## 2015-12-22 ENCOUNTER — Inpatient Hospital Stay: Payer: No Typology Code available for payment source

## 2015-12-22 DIAGNOSIS — D589 Hereditary hemolytic anemia, unspecified: Secondary | ICD-10-CM | POA: Diagnosis not present

## 2015-12-22 DIAGNOSIS — J309 Allergic rhinitis, unspecified: Secondary | ICD-10-CM | POA: Insufficient documentation

## 2015-12-22 DIAGNOSIS — D594 Other nonautoimmune hemolytic anemias: Secondary | ICD-10-CM

## 2015-12-22 LAB — CBC WITH DIFFERENTIAL/PLATELET
BASOS ABS: 0 10*3/uL (ref 0–0.1)
Basophils Relative: 0 %
Eosinophils Absolute: 0.1 10*3/uL (ref 0–0.7)
Eosinophils Relative: 2 %
HCT: 32.9 % — ABNORMAL LOW (ref 40.0–52.0)
HEMOGLOBIN: 11.1 g/dL — AB (ref 13.0–18.0)
LYMPHS ABS: 1.2 10*3/uL (ref 1.0–3.6)
LYMPHS PCT: 17 %
MCH: 34.5 pg — AB (ref 26.0–34.0)
MCHC: 33.6 g/dL (ref 32.0–36.0)
MCV: 102.7 fL — AB (ref 80.0–100.0)
Monocytes Absolute: 0.6 10*3/uL (ref 0.2–1.0)
Monocytes Relative: 9 %
NEUTROS ABS: 5.3 10*3/uL (ref 1.4–6.5)
NEUTROS PCT: 72 %
Platelets: 312 10*3/uL (ref 150–440)
RBC: 3.2 MIL/uL — AB (ref 4.40–5.90)
RDW: 22 % — ABNORMAL HIGH (ref 11.5–14.5)
WBC: 7.3 10*3/uL (ref 3.8–10.6)

## 2015-12-22 LAB — SAMPLE TO BLOOD BANK

## 2015-12-23 LAB — INTRINSIC FACTOR ANTIBODIES: INTRINSIC FACTOR: 31.8 [AU]/ml — AB (ref 0.0–1.1)

## 2015-12-23 NOTE — Progress Notes (Signed)
[place]  Telephone:([phone] [phone]  ID: Alexander Little. 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 His weakness and fatigue has significantly improved and is nearly back to his baseline. He currently feels well and is asymptomatic. He has no neurologic complaints. He has a sinus infection and is currently taking doxycycline, but denies fever. He has a good appetite and denies weight loss. He has no chest pain or shortness of breath. He denies any nausea, vomiting, constipation, or diarrhea. He has no melena or hematochezia. He has no urinary complaints. Patient offers no further specific complaints.  REVIEW OF SYSTEMS:   Review of Systems  Constitutional: Negative for fever, malaise/fatigue and weight loss.  HENT: Positive for congestion and sinus pain.   Respiratory: Negative.  Negative for cough and shortness of breath.   Cardiovascular: Negative.  Negative for chest pain and leg swelling.  Gastrointestinal: Negative.  Negative for abdominal pain, blood in stool, constipation, diarrhea and nausea.  Genitourinary: Negative.   Musculoskeletal: Negative.   Neurological: Negative for tingling, tremors, sensory change and weakness.  Psychiatric/Behavioral: Negative.  The patient is not nervous/anxious.     As per HPI. Otherwise, a complete review of systems is negative.  PAST MEDICAL HISTORY: Past Medical History:  Diagnosis Date  . BPH (benign prostatic hyperplasia)   . Chest pain   . Hyperlipidemia     PAST SURGICAL HISTORY: Past Surgical History:  Procedure Laterality Date  . CARDIAC CATHETERIZATION  2012   normal, [place]  . PROSTATE BIOPSY  2012   normal     FAMILY HISTORY: Family History  Problem Relation Age of Onset  . Stroke Mother   . Heart disease Mother   . Mental illness Mother   . COPD Mother   . Cancer Father 55    brain ca  . COPD Father   . Early death Father     ADVANCED DIRECTIVES (Y/N):  N  HEALTH MAINTENANCE: Social History  Substance Use Topics  . Smoking status: Current Every Day Smoker    Packs/day: 1.50    Types: Cigarettes    Last attempt to quit: 08/05/2013  . Smokeless tobacco: Never Used  . Alcohol use 0.0 oz/week     Colonoscopy:  PAP:  Bone density:  Lipid panel:  Allergies  Allergen Reactions  . Sulfa Antibiotics Rash    Current Outpatient Prescriptions  Medication Sig Dispense Refill  . benzonatate (TESSALON) 200 MG capsule Take by mouth.    . cetirizine-pseudoephedrine (ZYRTEC-D) 5-120 MG tablet Take 1 tablet by mouth 2 (two) times daily. (Patient taking differently: Take 1 tablet by mouth daily. ) 30 tablet 0  . Coenzyme Q10 (COQ10) 100 MG CAPS Take 100 mg by mouth daily.     . cyanocobalamin (,VITAMIN B-12,) 1000 MCG/ML injection Inject 1,000 mcg into the muscle every 14 (fourteen) days.    Marland Kitchen doxycycline (VIBRAMYCIN) 100 MG capsule Take by mouth.    . fluticasone (FLONASE) 50 MCG/ACT nasal spray Place 2 sprays into both nostrils daily. (Patient taking differently: Place 2 sprays into both nostrils as needed. ) 48 g 1  . guaiFENesin-codeine 100-10 MG/5ML syrup Take by mouth.    . montelukast (SINGULAIR) 10 MG tablet Take 1 tablet (10 mg  total) by mouth at bedtime. 90 tablet 1  . nicotine ([facility] NTS STEP 1) 21 mg/24hr patch PLACE 1 PATCH ONTO SKIN DAILY 28 patch 11  . simvastatin (ZOCOR) 40 MG tablet TAKE 1 TABLET (40 MG TOTAL) BY MOUTH DAILY AT 6 PM. 90 tablet 1  . tamsulosin (FLOMAX) 0.4 MG CAPS capsule TAKE 1 CAPSULE (0.4 MG TOTAL) BY MOUTH DAILY. 90 capsule 1  . cyanocobalamin (,VITAMIN B-12,) 1000 MCG/ML injection Inject 1 mL (1,000 mcg total) into the muscle every 30 (thirty) days. 1  mL 11  . NEEDLE, DISP, 25 G 25G X 1" MISC Inject 1 Dose into the muscle every 30 (thirty) days. 12 each 0  . SYRINGE DISPOSABLE 3CC 3 ML MISC Inject 1 Dose into the muscle every 30 (thirty) days. 12 each 0   No current facility-administered medications for this visit.     OBJECTIVE: Vitals:   12/24/15 1058  BP: 132/68  Pulse: 66  Resp: 18  Temp: 99.8 F (37.7 C)     Body mass index is 28 kg/m.    ECOG FS:0 - Asymptomatic  General: Well-developed, well-nourished, no acute distress. Eyes: Pink conjunctiva, anicteric sclera. Lungs: Clear to auscultation bilaterally. Heart: Regular rate and rhythm. No rubs, murmurs, or gallops. Abdomen: Soft, nontender, nondistended. No organomegaly noted, normoactive bowel sounds. Musculoskeletal: No edema, cyanosis, or clubbing. Neuro: Alert, answering all questions appropriately. Cranial nerves grossly intact. Skin: No rashes or petechiae noted. Psych: Normal affect.   LAB RESULTS:  Lab Results  Component Value Date   NA 139 12/01/2015   K 4.2 12/01/2015   CL 108 12/01/2015   CO2 26 12/01/2015   GLUCOSE 93 12/01/2015   BUN 11 12/01/2015   CREATININE 0.68 12/01/2015   CALCIUM 8.4 (L) 12/01/2015   PROT 6.7 11/30/2015   ALBUMIN 4.3 11/30/2015   AST 35 11/30/2015   ALT 24 11/30/2015   ALKPHOS 49 11/30/2015   BILITOT 1.4 (H) 11/30/2015   GFRNONAA >60 12/01/2015   GFRAA >60 12/01/2015    Lab Results  Component Value Date   WBC 7.3 12/22/2015   NEUTROABS 5.3 12/22/2015   HGB 11.1 (L) 12/22/2015   HCT 32.9 (L) 12/22/2015   MCV 102.7 (H) 12/22/2015   PLT 312 12/22/2015     STUDIES: Dg Chest 2 View  Result Date: 11/30/2015 CLINICAL DATA:  Shortness breath for 1 week. EXAM: CHEST  2 VIEW COMPARISON:  06/24/2015 FINDINGS: Stable densities and lucency at the right lung apex. Findings could be related to scarring and a bleb at the right apex. Otherwise, both lungs are clear. Atherosclerotic calcifications at the aortic arch. The  heart size is normal. No pleural effusions. No acute bone abnormality. IMPRESSION: No active cardiopulmonary disease. Aortic atherosclerosis. Electronically Signed   By: Richarda Overlie M.D.   On: 11/30/2015 13:19   Ct Chest W Contrast  Result Date: 12/01/2015 CLINICAL DATA:  Patient with worsening dizziness, lightheadedness and shortness of breath over the past 07-2008 days. Weakness and fatigue. Weight loss. EXAM: CT CHEST, ABDOMEN, AND PELVIS WITH CONTRAST TECHNIQUE: Multidetector CT imaging of the chest, abdomen and pelvis was performed following the standard protocol during bolus administration of intravenous contrast. CONTRAST:  ISOVUE-300 IOPAMIDOL (ISOVUE-300) INJECTION 61% COMPARISON:  Chest radiograph 11/30/2015; chest CT 08/31/2015. FINDINGS: CT CHEST FINDINGS Cardiovascular: Normal heart size. Trace fluid within the superior pericardial recess. Peripheral calcified atherosclerotic plaque involving the thoracic aorta. Mediastinum/Nodes: No enlarged axillary, mediastinal or hilar lymphadenopathy. Esophagus is unremarkable. Lungs/Pleura: Central airways are patent.  No consolidative or nodular pulmonary opacities. No pleural effusion or pneumothorax. No discrete pulmonary nodules are identified. Musculoskeletal: Thoracic spine degenerative changes. No aggressive or acute appearing osseous lesions. CT ABDOMEN PELVIS FINDINGS Hepatobiliary: Stable sub cm low-attenuation lesion near the caudate lobe, too small to characterize however likely representing a small cyst (image 59; series 2). Gallbladder is unremarkable. Pancreas: Unremarkable Spleen: Unremarkable Adrenals/Urinary Tract: The adrenal glands are normal. Kidneys enhance symmetrically with contrast. No hydronephrosis. Urinary bladder is unremarkable. Stomach/Bowel: Trace free fluid in the pelvis. No abnormal bowel wall thickening or evidence for bowel obstruction. Normal appendix. No free intraperitoneal air. Normal morphology of the stomach.  Vascular/Lymphatic: Normal caliber abdominal aorta. Peripheral calcified atherosclerotic plaque. No retroperitoneal lymphadenopathy. Reproductive: Prostate unremarkable. Other: Small bilateral fat containing inguinal hernias. Musculoskeletal: Lumbar spine degenerative changes. No aggressive or acute appearing osseous lesions. IMPRESSION: No acute process within the chest, abdomen or pelvis. Aortic atherosclerosis. Small bilateral fat containing inguinal hernias. Electronically Signed   By: Lovey Newcomer M.D.   On: 12/01/2015 10:32   Ct Abdomen Pelvis W Contrast  Result Date: 12/01/2015 CLINICAL DATA:  Patient with worsening dizziness, lightheadedness and shortness of breath over the past 07-2008 days. Weakness and fatigue. Weight loss. EXAM: CT CHEST, ABDOMEN, AND PELVIS WITH CONTRAST TECHNIQUE: Multidetector CT imaging of the chest, abdomen and pelvis was performed following the standard protocol during bolus administration of intravenous contrast. CONTRAST:  127m ISOVUE-300 IOPAMIDOL (ISOVUE-300) INJECTION 61% COMPARISON:  Chest radiograph 11/30/2015; chest CT 08/31/2015. FINDINGS: CT CHEST FINDINGS Cardiovascular: Normal heart size. Trace fluid within the superior pericardial recess. Peripheral calcified atherosclerotic plaque involving the thoracic aorta. Mediastinum/Nodes: No enlarged axillary, mediastinal or hilar lymphadenopathy. Esophagus is unremarkable. Lungs/Pleura: Central airways are patent. No consolidative or nodular pulmonary opacities. No pleural effusion or pneumothorax. No discrete pulmonary nodules are identified. Musculoskeletal: Thoracic spine degenerative changes. No aggressive or acute appearing osseous lesions. CT ABDOMEN PELVIS FINDINGS Hepatobiliary: Stable sub cm low-attenuation lesion near the caudate lobe, too small to characterize however likely representing a small cyst (image 59; series 2). Gallbladder is unremarkable. Pancreas: Unremarkable Spleen: Unremarkable Adrenals/Urinary  Tract: The adrenal glands are normal. Kidneys enhance symmetrically with contrast. No hydronephrosis. Urinary bladder is unremarkable. Stomach/Bowel: Trace free fluid in the pelvis. No abnormal bowel wall thickening or evidence for bowel obstruction. Normal appendix. No free intraperitoneal air. Normal morphology of the stomach. Vascular/Lymphatic: Normal caliber abdominal aorta. Peripheral calcified atherosclerotic plaque. No retroperitoneal lymphadenopathy. Reproductive: Prostate unremarkable. Other: Small bilateral fat containing inguinal hernias. Musculoskeletal: Lumbar spine degenerative changes. No aggressive or acute appearing osseous lesions. IMPRESSION: No acute process within the chest, abdomen or pelvis. Aortic atherosclerosis. Small bilateral fat containing inguinal hernias. Electronically Signed   By: DLovey NewcomerM.D.   On: 12/01/2015 10:32   Ct Biopsy  Result Date: 12/03/2015 INDICATION: 55year old with acute hemolytic anemia. EXAM: CT GUIDED BONE MARROW ASPIRATES AND BIOPSY Physician: AStephan Minister HAnselm Pancoast MD MEDICATIONS: None. ANESTHESIA/SEDATION: Fentanyl 3.0 mcg IV; Versed 100 mg IV Moderate Sedation Time:  16 minutes The patient was continuously monitored during the procedure by the interventional radiology nurse under my direct supervision. COMPLICATIONS: None immediate. PROCEDURE: The procedure was explained to the patient. The risks and benefits of the procedure were discussed and the patient's questions were addressed. Informed consent was obtained from the patient. The patient was placed prone on CT scan. Images of the pelvis were obtained. The right side of back was prepped and draped in sterile fashion. The skin and right posterior iliac bone were  anesthetized with 1% lidocaine. 11 gauge bone needle was directed into the right iliac bone with CT guidance. Two aspirates and one core biopsy obtained. Bandage placed over the puncture site. FINDINGS: Bone needle directed to the posterior aspect  of the right ilium. IMPRESSION: CT guided bone marrow aspirates and core biopsy. Electronically Signed   By: Markus Daft M.D.   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 Hemolytic anemia: Patient was found to be significantly B-12 deficient, but otherwise the remainder of his laboratory work is either negative or within normal limits. 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 PNH studies were drawn improperly and not resulted. We will resend if there is any further suspicion. Hemoglobin is improving with B 12 injections. Continue 1000 g intramuscular B-12 every 4 weeks. Return to clinic in 4 weeks for repeat laboratory work and further evaluation. 2. Thrombocytopenia: Resolved. Possibly secondary to B-12 deficiency. Monitor. 3. B-12 deficiency: Patient has a significantly elevated intrinsic factor. Referral was given to GI for further evaluation.   Approximately 30 minutes spent in discussion of which greater than 50% was consultation.  Patient expressed understanding and was in agreement with this plan. He also understands that He can call clinic at any time with any questions, concerns, or complaints.    Lloyd Huger, MD   12/27/2015 9:39 AM

## 2015-12-24 ENCOUNTER — Ambulatory Visit: Payer: No Typology Code available for payment source

## 2015-12-24 ENCOUNTER — Inpatient Hospital Stay: Payer: No Typology Code available for payment source

## 2015-12-24 ENCOUNTER — Inpatient Hospital Stay (HOSPITAL_BASED_OUTPATIENT_CLINIC_OR_DEPARTMENT_OTHER): Payer: No Typology Code available for payment source | Admitting: Oncology

## 2015-12-24 ENCOUNTER — Other Ambulatory Visit: Payer: No Typology Code available for payment source

## 2015-12-24 ENCOUNTER — Ambulatory Visit: Payer: No Typology Code available for payment source | Admitting: Oncology

## 2015-12-24 VITALS — BP 132/68 | HR 66 | Temp 99.8°F | Resp 18 | Wt 189.6 lb

## 2015-12-24 DIAGNOSIS — R531 Weakness: Secondary | ICD-10-CM

## 2015-12-24 DIAGNOSIS — D594 Other nonautoimmune hemolytic anemias: Secondary | ICD-10-CM

## 2015-12-24 DIAGNOSIS — Z79899 Other long term (current) drug therapy: Secondary | ICD-10-CM | POA: Diagnosis not present

## 2015-12-24 DIAGNOSIS — K4020 Bilateral inguinal hernia, without obstruction or gangrene, not specified as recurrent: Secondary | ICD-10-CM

## 2015-12-24 DIAGNOSIS — D589 Hereditary hemolytic anemia, unspecified: Secondary | ICD-10-CM | POA: Diagnosis not present

## 2015-12-24 DIAGNOSIS — J329 Chronic sinusitis, unspecified: Secondary | ICD-10-CM | POA: Diagnosis not present

## 2015-12-24 DIAGNOSIS — R079 Chest pain, unspecified: Secondary | ICD-10-CM

## 2015-12-24 DIAGNOSIS — E785 Hyperlipidemia, unspecified: Secondary | ICD-10-CM

## 2015-12-24 DIAGNOSIS — Z808 Family history of malignant neoplasm of other organs or systems: Secondary | ICD-10-CM

## 2015-12-24 DIAGNOSIS — R5383 Other fatigue: Secondary | ICD-10-CM

## 2015-12-24 DIAGNOSIS — E538 Deficiency of other specified B group vitamins: Secondary | ICD-10-CM

## 2015-12-24 DIAGNOSIS — F17210 Nicotine dependence, cigarettes, uncomplicated: Secondary | ICD-10-CM

## 2015-12-24 DIAGNOSIS — M47816 Spondylosis without myelopathy or radiculopathy, lumbar region: Secondary | ICD-10-CM

## 2015-12-24 DIAGNOSIS — N400 Enlarged prostate without lower urinary tract symptoms: Secondary | ICD-10-CM

## 2015-12-24 DIAGNOSIS — I700 Atherosclerosis of aorta: Secondary | ICD-10-CM

## 2015-12-24 MED ORDER — CYANOCOBALAMIN 1000 MCG/ML IJ SOLN
1000.0000 ug | Freq: Once | INTRAMUSCULAR | Status: AC
  Administered 2015-12-24: 1000 ug via INTRAMUSCULAR
  Filled 2015-12-24: qty 1

## 2015-12-24 MED ORDER — SYRINGE DISPOSABLE 3 ML MISC: 1.0000 | 0 refills | Status: DC

## 2015-12-24 MED ORDER — "NEEDLE (DISP) 25G X 1"" MISC": 1.0000 | 0 refills | Status: DC

## 2015-12-24 MED ORDER — CYANOCOBALAMIN 1000 MCG/ML IJ SOLN: 1000.0000 ug | INTRAMUSCULAR | 11 refills | Status: DC

## 2015-12-24 NOTE — Progress Notes (Signed)
Patient is currently on Doxycycline for a sinus infection.  Temp today 99.8 in left ear and 100 in right ear.

## 2015-12-24 NOTE — Progress Notes (Signed)
Check out note from 12/11/15 visit stated patient to get b12 injection in 2 weeks (today).

## 2016-01-10 ENCOUNTER — Encounter: Payer: Self-pay | Admitting: Oncology

## 2016-01-14 ENCOUNTER — Other Ambulatory Visit: Payer: Self-pay | Admitting: Internal Medicine

## 2016-01-18 ENCOUNTER — Telehealth: Payer: Self-pay | Admitting: Gastroenterology

## 2016-01-18 NOTE — Telephone Encounter (Signed)
Patient called back to schedule an EGD. Patient stated if he doesn't answer the phone please leave a message with the date and time. 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

## 2016-01-18 NOTE — Telephone Encounter (Signed)
Patient is returning a call to schedule an EGD.

## 2016-01-22 ENCOUNTER — Encounter: Payer: Self-pay | Admitting: *Deleted

## 2016-01-22 ENCOUNTER — Other Ambulatory Visit: Payer: Self-pay

## 2016-01-22 MED ORDER — NA SULFATE-K SULFATE-MG SULF 17.5-3.13-1.6 GM/177ML PO SOLN: 1.0000 | ORAL | 0 refills | Status: DC

## 2016-01-22 NOTE — Telephone Encounter (Signed)
Pt has been scheduled for colonoscopy/EGD at [facility] on 01/28/16. Instructs mailed. RX called into pharmacy.

## 2016-01-25 ENCOUNTER — Encounter: Payer: Self-pay | Admitting: Anesthesiology

## 2016-01-25 ENCOUNTER — Inpatient Hospital Stay (HOSPITAL_BASED_OUTPATIENT_CLINIC_OR_DEPARTMENT_OTHER): Payer: No Typology Code available for payment source | Admitting: Oncology

## 2016-01-25 ENCOUNTER — Inpatient Hospital Stay: Payer: No Typology Code available for payment source | Attending: Oncology

## 2016-01-25 ENCOUNTER — Ambulatory Visit: Payer: No Typology Code available for payment source

## 2016-01-25 ENCOUNTER — Other Ambulatory Visit: Payer: No Typology Code available for payment source

## 2016-01-25 ENCOUNTER — Ambulatory Visit: Payer: No Typology Code available for payment source | Admitting: Oncology

## 2016-01-25 ENCOUNTER — Telehealth: Payer: Self-pay | Admitting: Gastroenterology

## 2016-01-25 ENCOUNTER — Inpatient Hospital Stay: Payer: No Typology Code available for payment source

## 2016-01-25 VITALS — BP 122/66 | Temp 98.9°F | Wt 191.1 lb

## 2016-01-25 DIAGNOSIS — R011 Cardiac murmur, unspecified: Secondary | ICD-10-CM | POA: Diagnosis not present

## 2016-01-25 DIAGNOSIS — Z808 Family history of malignant neoplasm of other organs or systems: Secondary | ICD-10-CM | POA: Insufficient documentation

## 2016-01-25 DIAGNOSIS — Z79899 Other long term (current) drug therapy: Secondary | ICD-10-CM | POA: Insufficient documentation

## 2016-01-25 DIAGNOSIS — D589 Hereditary hemolytic anemia, unspecified: Secondary | ICD-10-CM | POA: Insufficient documentation

## 2016-01-25 DIAGNOSIS — Z7982 Long term (current) use of aspirin: Secondary | ICD-10-CM

## 2016-01-25 DIAGNOSIS — D538 Other specified nutritional anemias: Secondary | ICD-10-CM | POA: Diagnosis not present

## 2016-01-25 DIAGNOSIS — E538 Deficiency of other specified B group vitamins: Secondary | ICD-10-CM

## 2016-01-25 DIAGNOSIS — N400 Enlarged prostate without lower urinary tract symptoms: Secondary | ICD-10-CM | POA: Insufficient documentation

## 2016-01-25 DIAGNOSIS — F17210 Nicotine dependence, cigarettes, uncomplicated: Secondary | ICD-10-CM | POA: Insufficient documentation

## 2016-01-25 DIAGNOSIS — D594 Other nonautoimmune hemolytic anemias: Secondary | ICD-10-CM

## 2016-01-25 DIAGNOSIS — E785 Hyperlipidemia, unspecified: Secondary | ICD-10-CM | POA: Diagnosis not present

## 2016-01-25 LAB — CBC WITH DIFFERENTIAL/PLATELET
Basophils Absolute: 0 10*3/uL (ref 0–0.1)
Basophils Relative: 0 %
EOS ABS: 0.1 10*3/uL (ref 0–0.7)
Eosinophils Relative: 2 %
HCT: 38.5 % — ABNORMAL LOW (ref 40.0–52.0)
HEMOGLOBIN: 13.1 g/dL (ref 13.0–18.0)
LYMPHS ABS: 2.2 10*3/uL (ref 1.0–3.6)
LYMPHS PCT: 33 %
MCH: 30.9 pg (ref 26.0–34.0)
MCHC: 33.9 g/dL (ref 32.0–36.0)
MCV: 91.4 fL (ref 80.0–100.0)
Monocytes Absolute: 0.4 10*3/uL (ref 0.2–1.0)
Monocytes Relative: 6 %
Neutro Abs: 3.9 10*3/uL (ref 1.4–6.5)
Neutrophils Relative %: 59 %
Platelets: 176 10*3/uL (ref 150–440)
RBC: 4.22 MIL/uL — AB (ref 4.40–5.90)
RDW: 14 % (ref 11.5–14.5)
WBC: 6.6 10*3/uL (ref 3.8–10.6)

## 2016-01-25 LAB — SAMPLE TO BLOOD BANK

## 2016-01-25 NOTE — Telephone Encounter (Signed)
Can you see if I sent this to you last week? Pt is scheduled for a colonoscopy and EGD on 01/28/16 with [facility]. Please precert for [id number] deficiency E53.8.

## 2016-01-25 NOTE — Telephone Encounter (Signed)
Patient is having an upper and lower Thursday and was wondering if this has been authorized? Please call  [phone]

## 2016-01-25 NOTE — Progress Notes (Signed)
[place]  [phone] [phone]  ID: Alexander Little. 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  He currently feels well and is asymptomatic. He has no neurologic complaints. He denies any recent fevers or illnesses. He has a good appetite and denies weight loss. He has no chest pain or shortness of breath. He denies any nausea, vomiting, constipation, or diarrhea. He has no melena or hematochezia. He has no urinary complaints. Patient offers no specific complaints today.  REVIEW OF SYSTEMS:   Review of Systems  Constitutional: Negative for fever, malaise/fatigue and weight loss.  HENT: Negative for congestion and sinus pain.   Respiratory: Negative.  Negative for cough and shortness of breath.   Cardiovascular: Negative.  Negative for chest pain and leg swelling.  Gastrointestinal: Negative.  Negative for abdominal pain, blood in stool, constipation, diarrhea and nausea.  Genitourinary: Negative.   Musculoskeletal: Negative.   Neurological: Negative for tingling, tremors, sensory change and weakness.  Psychiatric/Behavioral: Negative.  The patient is not nervous/anxious.     As per HPI. Otherwise, a complete review of systems is negative.  PAST MEDICAL HISTORY: Past Medical History:  Diagnosis Date  . Anemia   . BPH (benign prostatic hyperplasia)   . Chest pain   . Dental bridge present    permanent - upper  . Heart murmur    followed by PCP  . Hyperlipidemia     PAST SURGICAL HISTORY: Past Surgical History:  Procedure Laterality Date  . CARDIAC CATHETERIZATION  2012   normal, [place]  .  PROSTATE BIOPSY  2012   normal    FAMILY HISTORY: Family History  Problem Relation Age of Onset  . Stroke Mother   . Heart disease Mother   . Mental illness Mother   . COPD Mother   . Cancer Father 4    brain ca  . COPD Father   . Early death Father     ADVANCED DIRECTIVES (Y/N):  N  HEALTH MAINTENANCE: Social History  Substance Use Topics  . Smoking status: Current Every Day Smoker    Packs/day: 1.50    Types: Cigarettes    Last attempt to quit: 08/05/2013  . Smokeless tobacco: Never Used     Comment: since age 39.  has cut back to 1 PPD  . Alcohol use 0.0 oz/week     Comment: Holidays     Colonoscopy:  PAP:  Bone density:  Lipid panel:  Allergies  Allergen Reactions  . Sulfa Antibiotics Rash    Current Outpatient Prescriptions  Medication Sig Dispense Refill  . ASPIRIN 81 PO Take by mouth daily.    . B-D INTE[employer]RA SYRINGE 25G X 5/8" 3 ML MISC INJECT 1 DOSE INTO THE MUSCLE EVERY 30 (THIRTY) DAYS.  0  . cetirizine-pseudoephedrine (ZYRTEC-D) 5-120 MG tablet Take 1 tablet by mouth 2 (two) times daily. (Patient taking differently: Take 1 tablet by mouth daily. ) 30 tablet 0  . Coenzyme Q10 (COQ10) 100 MG CAPS Take 100 mg by mouth daily.     . cyanocobalamin (,VITAMIN B-12,) 1000 MCG/ML injection Inject 1,000 mcg into the muscle every 14 (fourteen) days.    . cyanocobalamin (,VITAMIN B-12,) 1000 MCG/ML injection Inject  1 mL (1,000 mcg total) into the muscle every 30 (thirty) days. 1 mL 11  . fluticasone (FLONASE) 50 MCG/ACT nasal spray PLACE 2 SPRAYS INTO BOTH NOSTRILS DAILY. 48 g 1  . montelukast (SINGULAIR) 10 MG tablet Take 1 tablet (10 mg total) by mouth at bedtime. 90 tablet 1  . NEEDLE, DISP, 25 G 25G X 1" MISC Inject 1 Dose into the muscle every 30 (thirty) days. 12 each 0  . nicotine ([facility] NTS STEP 1) 21 mg/24hr patch PLACE 1 PATCH ONTO SKIN DAILY 28 patch 11  . simvastatin (ZOCOR) 40 MG tablet TAKE 1 TABLET (40 MG TOTAL) BY MOUTH DAILY AT 6 PM. 90 tablet 1  .  SYRINGE DISPOSABLE 3CC 3 ML MISC Inject 1 Dose into the muscle every 30 (thirty) days. 12 each 0  . tamsulosin (FLOMAX) 0.4 MG CAPS capsule TAKE 1 CAPSULE (0.4 MG TOTAL) BY MOUTH DAILY. 90 capsule 1  . guaiFENesin-codeine 100-10 MG/5ML syrup Take by mouth.    . Na Sulfate-K Sulfate-Mg Sulf (SUPREP BOWEL PREP KIT) 17.5-3.13-1.6 GM/180ML SOLN Take 1 kit by mouth as directed. (Patient not taking: Reported on 01/25/2016) 1 Bottle 0   No current facility-administered medications for this visit.     OBJECTIVE: Vitals:   01/25/16 1523  BP: 122/66  Temp: 98.9 F (37.2 C)     Body mass index is 28.22 kg/m.    ECOG FS:0 - Asymptomatic  General: Well-developed, well-nourished, no acute distress. Eyes: Pink conjunctiva, anicteric sclera. Lungs: Clear to auscultation bilaterally. Heart: Regular rate and rhythm. No rubs, murmurs, or gallops. Abdomen: Soft, nontender, nondistended. No organomegaly noted, normoactive bowel sounds. Musculoskeletal: No edema, cyanosis, or clubbing. Neuro: Alert, answering all questions appropriately. Cranial nerves grossly intact. Skin: No rashes or petechiae noted. Psych: Normal affect.   LAB RESULTS:  Lab Results  Component Value Date   NA 139 12/01/2015   K 4.2 12/01/2015   CL 108 12/01/2015   CO2 26 12/01/2015   GLUCOSE 93 12/01/2015   BUN 11 12/01/2015   CREATININE 0.68 12/01/2015   CALCIUM 8.4 (L) 12/01/2015   PROT 6.7 11/30/2015   ALBUMIN 4.3 11/30/2015   AST 35 11/30/2015   ALT 24 11/30/2015   ALKPHOS 49 11/30/2015   BILITOT 1.4 (H) 11/30/2015   GFRNONAA >60 12/01/2015   GFRAA >60 12/01/2015    Lab Results  Component Value Date   WBC 6.6 01/25/2016   NEUTROABS 3.9 01/25/2016   HGB 13.1 01/25/2016   HCT 38.5 (L) 01/25/2016   MCV 91.4 01/25/2016   PLT 176 01/25/2016     STUDIES: No results found.  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 Hemolytic anemia: Patient was found to be significantly B-12 deficient,  but otherwise the remainder of his laboratory work is either negative or within normal limits. 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 PNH studies were drawn improperly and not resulted. We will resend if there is any further suspicion. Hemoglobin is improving with B 12 injections. Continue 1000 g intramuscular B-12 every 4 weeks at home. Return to clinic in 6 weeks for repeat laboratory work and then in 3 months for further evaluation. 2. Thrombocytopenia: Resolved. Possibly secondary to B-12 deficiency. Monitor. 3. B-12 deficiency: Patient has a significantly elevated intrinsic factor. Referral was given to GI for further evaluation, but patient declined endoscopy and colonoscopy secondary to cost. He does not have an interest in rescheduling at this time.   Approximately 30 minutes spent in discussion of  which greater than 50% was consultation.  Patient expressed understanding and was in agreement with this plan. He also understands that He can call clinic at any time with any questions, concerns, or complaints.    Lloyd Huger, MD   01/27/2016 9:03 AM

## 2016-01-25 NOTE — Progress Notes (Signed)
Patient denies pain or discomfort at this time.  Vitals stable and documented  

## 2016-01-26 ENCOUNTER — Telehealth: Payer: Self-pay | Admitting: Gastroenterology

## 2016-01-26 NOTE — Telephone Encounter (Signed)
Noted! Thank you

## 2016-01-26 NOTE — Telephone Encounter (Signed)
Patient called to cancel his colonoscopy on 1/25 and stated that he didn't want to have it done. I called [facility]

## 2016-01-28 ENCOUNTER — Ambulatory Visit
Admission: RE | Admit: 2016-01-28 | Payer: No Typology Code available for payment source | Source: Ambulatory Visit | Admitting: Gastroenterology

## 2016-01-28 ENCOUNTER — Ambulatory Visit: Payer: Self-pay | Admitting: Gastroenterology

## 2016-01-28 HISTORY — DX: Anemia, unspecified: D64.9

## 2016-01-28 HISTORY — DX: Presence of dental prosthetic device (complete) (partial): Z97.2

## 2016-01-28 HISTORY — DX: Cardiac murmur, unspecified: R01.1

## 2016-01-28 SURGERY — COLONOSCOPY WITH PROPOFOL
Anesthesia: Choice

## 2016-02-01 ENCOUNTER — Other Ambulatory Visit: Payer: Self-pay | Admitting: Internal Medicine

## 2016-02-02 NOTE — Telephone Encounter (Signed)
PT has no future scheduled appts and no recent labs. Pt last seen on 12/16/15. Ok to refill Zocor and Flomax?

## 2016-02-03 NOTE — Telephone Encounter (Signed)
REFILLED FOR 6 MONTHS,  NeedsOV in July

## 2016-02-03 NOTE — Telephone Encounter (Signed)
Pt needs to be seen in July for any more further refills on medications. Could you call and schedule pt? Thank you.

## 2016-02-04 NOTE — Telephone Encounter (Signed)
Appt has been made for 07/26/16 at 4 pm.

## 2016-02-05 ENCOUNTER — Other Ambulatory Visit: Payer: Self-pay | Admitting: Internal Medicine

## 2016-02-11 ENCOUNTER — Encounter: Payer: Self-pay | Admitting: Internal Medicine

## 2016-02-25 ENCOUNTER — Encounter: Payer: Self-pay | Admitting: Internal Medicine

## 2016-03-01 ENCOUNTER — Other Ambulatory Visit: Payer: Self-pay | Admitting: *Deleted

## 2016-03-01 DIAGNOSIS — J301 Allergic rhinitis due to pollen: Secondary | ICD-10-CM

## 2016-03-01 MED ORDER — MONTELUKAST SODIUM 10 MG PO TABS: 10.0000 mg | ORAL_TABLET | Freq: Every day | ORAL | 1 refills | Status: DC

## 2016-03-07 ENCOUNTER — Inpatient Hospital Stay: Payer: No Typology Code available for payment source | Attending: Oncology

## 2016-03-07 DIAGNOSIS — D538 Other specified nutritional anemias: Secondary | ICD-10-CM | POA: Diagnosis not present

## 2016-03-07 DIAGNOSIS — E538 Deficiency of other specified B group vitamins: Secondary | ICD-10-CM | POA: Diagnosis present

## 2016-03-07 DIAGNOSIS — D594 Other nonautoimmune hemolytic anemias: Secondary | ICD-10-CM

## 2016-03-07 LAB — CBC WITH DIFFERENTIAL/PLATELET
BASOS PCT: 0 %
Basophils Absolute: 0 10*3/uL (ref 0–0.1)
EOS ABS: 0.1 10*3/uL (ref 0–0.7)
Eosinophils Relative: 2 %
HEMATOCRIT: 42.7 % (ref 40.0–52.0)
HEMOGLOBIN: 14.8 g/dL (ref 13.0–18.0)
Lymphocytes Relative: 34 %
Lymphs Abs: 2.5 10*3/uL (ref 1.0–3.6)
MCH: 29.9 pg (ref 26.0–34.0)
MCHC: 34.6 g/dL (ref 32.0–36.0)
MCV: 86.5 fL (ref 80.0–100.0)
Monocytes Absolute: 0.4 10*3/uL (ref 0.2–1.0)
Monocytes Relative: 6 %
NEUTROS ABS: 4.2 10*3/uL (ref 1.4–6.5)
NEUTROS PCT: 58 %
Platelets: 171 10*3/uL (ref 150–440)
RBC: 4.94 MIL/uL (ref 4.40–5.90)
RDW: 14.1 % (ref 11.5–14.5)
WBC: 7.3 10*3/uL (ref 3.8–10.6)

## 2016-03-07 LAB — VITAMIN B12: VITAMIN B 12: 230 pg/mL (ref 180–914)

## 2016-03-09 ENCOUNTER — Telehealth: Payer: Self-pay

## 2016-03-09 NOTE — Telephone Encounter (Signed)
-----   Message from Lloyd Huger, MD sent at 03/09/2016  2:34 PM EST ----- Continue B-12 shots at home. Follow-up as scheduled.  ----- Message ----- From: Luretha Murphy, CMA Sent: 03/09/2016   2:03 PM To: Lloyd Huger, MD  Patient B12 levels are 230, any other notes for patient?

## 2016-03-09 NOTE — Telephone Encounter (Signed)
Advised patient about his B12 levels and to continue the b12 shots at home. Patient has been notified and voiced understanding all questions have been answered.

## 2016-03-10 ENCOUNTER — Telehealth: Payer: Self-pay | Admitting: *Deleted

## 2016-03-10 NOTE — Telephone Encounter (Signed)
Patient called complaining of stomach "churning" and not feeling well. Returned call and left message on VM to call PCP

## 2016-03-11 ENCOUNTER — Encounter: Payer: Self-pay | Admitting: Internal Medicine

## 2016-03-11 ENCOUNTER — Ambulatory Visit (INDEPENDENT_AMBULATORY_CARE_PROVIDER_SITE_OTHER): Payer: No Typology Code available for payment source | Admitting: Internal Medicine

## 2016-03-11 ENCOUNTER — Ambulatory Visit: Payer: No Typology Code available for payment source | Admitting: Family Medicine

## 2016-03-11 DIAGNOSIS — E538 Deficiency of other specified B group vitamins: Secondary | ICD-10-CM | POA: Diagnosis not present

## 2016-03-11 DIAGNOSIS — F418 Other specified anxiety disorders: Secondary | ICD-10-CM | POA: Diagnosis not present

## 2016-03-11 DIAGNOSIS — D594 Other nonautoimmune hemolytic anemias: Secondary | ICD-10-CM | POA: Diagnosis not present

## 2016-03-11 DIAGNOSIS — Z720 Tobacco use: Secondary | ICD-10-CM

## 2016-03-11 DIAGNOSIS — K5900 Constipation, unspecified: Secondary | ICD-10-CM | POA: Diagnosis not present

## 2016-03-11 MED ORDER — ALPRAZOLAM 0.25 MG PO TABS: 0.2500 mg | ORAL_TABLET | Freq: Every evening | ORAL | 2 refills | Status: DC | PRN

## 2016-03-11 NOTE — Progress Notes (Signed)
 Subjective:  Patient ID: Alexander Ray Wasilewski Jr., male    DOB: 10/03/1960  Age: 55 y.o. MRN: [id number]  CC: Diagnoses of Tobacco abuse, B12 deficiency, Constipation, unspecified constipation type, Other non-autoimmune hemolytic anemias (HCC), and Anxiety about health were pertinent to this visit.  HPI Alexander Ray Hoglund Jr. presents for follow up on chronic conditions,  Last seen in December after admission to [facility] with symptomatic megaloblastic anemia secondary to B12 deficiency with elevated intrinsic factor antibodies and hemolysis   He declined further workup with EGD and colonoscopy due to out of pocket cost of $1200/.  He has been chronically constipated since  August 2017 and has been having generalized abdominal pain for the last 5 days , accompanied by  fecal urgency/incontinence on one occasion due to an explosive liquid stool,  .  Feeling was temporarily relieved after moving bowels until next morning.  Had normal bm on tuesday . He also reports mild bilateral flank pain, but has had  Normal stools as well.    Had hearing and vision tests by employee [employer] .  Had trouble with vision.  Made eye appt,  May 8  Anxiety is worse than ever , mind has been wandering a lot due to worrying about his medical bills.  [facility] has sent him to collections despite him paying monthly on his hospital bill .      Deferred colonoscopy due to out of pocket cost of $1200  One small polyp on prior colonoscopy 3 years   Getting monthly b12 injections at the pharmacy since January for $13.50 for pernicious anemia . Had 2 in dec   Following a gluten free diet   Upper thoracic back pain brought on by driving > 45 min or empyting the dishwasher .  Has degenerative changes on prior chest CT       Outpatient Medications Prior to Visit  Medication Sig Dispense Refill  . ASPIRIN 81 PO Take by mouth daily.    . B-D INTEGRA SYRINGE 25G X 5/8" 3 ML MISC INJECT 1 DOSE INTO THE MUSCLE EVERY 30  (THIRTY) DAYS.  0  . cetirizine-pseudoephedrine (ZYRTEC-D) 5-120 MG tablet Take 1 tablet by mouth 2 (two) times daily. (Patient taking differently: Take 1 tablet by mouth daily. ) 30 tablet 0  . Coenzyme Q10 (COQ10) 100 MG CAPS Take 100 mg by mouth daily.     . cyanocobalamin (,VITAMIN B-12,) 1000 MCG/ML injection Inject 1,000 mcg into the muscle every 14 (fourteen) days.    . cyanocobalamin (,VITAMIN B-12,) 1000 MCG/ML injection Inject 1 mL (1,000 mcg total) into the muscle every 30 (thirty) days. 1 mL 11  . fluticasone (FLONASE) 50 MCG/ACT nasal spray PLACE 2 SPRAYS INTO BOTH NOSTRILS DAILY. 48 g 1  . montelukast (SINGULAIR) 10 MG tablet Take 1 tablet (10 mg total) by mouth at bedtime. 90 tablet 1  . NEEDLE, DISP, 25 G 25G X 1" MISC Inject 1 Dose into the muscle every 30 (thirty) days. 12 each 0  . nicotine ([facility] NTS STEP 1) 21 mg/24hr patch PLACE 1 PATCH ONTO SKIN DAILY 28 patch 11  . simvastatin (ZOCOR) 40 MG tablet TAKE 1 TABLET (40 MG TOTAL) BY MOUTH DAILY AT 6 PM. 90 tablet 1  . SYRINGE DISPOSABLE 3CC 3 ML MISC Inject 1 Dose into the muscle every 30 (thirty) days. 12 each 0  . tamsulosin (FLOMAX) 0.4 MG CAPS capsule TAKE 1 CAPSULE (0.4 MG TOTAL) BY MOUTH DAILY. 90 capsule 1  .   guaiFENesin-codeine 100-10 MG/5ML syrup Take by mouth.    . Na Sulfate-K Sulfate-Mg Sulf (SUPREP BOWEL PREP KIT) 17.5-3.13-1.6 GM/180ML SOLN Take 1 kit by mouth as directed. 1 Bottle 0   No facility-administered medications prior to visit.     Review of Systems;  Patient denies headache, fevers, malaise, unintentional weight loss, skin rash, eye pain, sinus congestion and sinus pain, sore throat, dysphagia,  hemoptysis , cough, dyspnea, wheezing, chest pain, palpitations, orthopnea, edema, abdominal pain, nausea, melena, diarrhea, constipation, flank pain, dysuria, hematuria, urinary  Frequency, nocturia, numbness, tingling, seizures,  Focal weakness, Loss of consciousness,  Tremor, insomnia, depression, anxiety, and  suicidal ideation.      Objective:  BP 136/70   Pulse 63   Temp 98.1 F (36.7 C) (Oral)   Resp 16   Wt 192 lb (87.1 kg)   SpO2 96%   BMI 28.35 kg/m   BP Readings from Last 3 Encounters:  03/11/16 136/70  01/25/16 122/66  12/24/15 132/68    Wt Readings from Last 3 Encounters:  03/11/16 192 lb (87.1 kg)  01/25/16 191 lb 1.5 oz (86.7 kg)  12/24/15 189 lb 9.5 oz (86 kg)    General appearance: alert, cooperative and appears stated age Ears: normal TM's and external ear canals both ears Throat: lips, mucosa, and tongue normal; teeth and gums normal Neck: no adenopathy, no carotid bruit, supple, symmetrical, trachea midline and thyroid not enlarged, symmetric, no tenderness/mass/nodules Back: symmetric, no curvature. ROM normal. No CVA tenderness. Lungs: clear to auscultation bilaterally Heart: regular rate and rhythm, S1, S2 normal, no murmur, click, rub or gallop Abdomen: soft, non-tender; bowel sounds normal; no masses,  no organomegaly Pulses: 2+ and symmetric Skin: Skin color, texture, turgor normal. No rashes or lesions Lymph nodes: Cervical, supraclavicular, and axillary nodes normal.  No results found for: HGBA1C  Lab Results  Component Value Date   CREATININE 0.68 12/01/2015   CREATININE 0.66 11/30/2015   CREATININE 0.76 11/30/2015    Lab Results  Component Value Date   WBC 7.3 03/07/2016   HGB 14.8 03/07/2016   HCT 42.7 03/07/2016   PLT 171 03/07/2016   GLUCOSE 93 12/01/2015   CHOL 174 01/15/2014   TRIG 225.0 (H) 01/15/2014   HDL 37.70 (L) 01/15/2014   LDLDIRECT 98.0 06/22/2015   LDLCALC 100 (H) 01/14/2013   ALT 24 11/30/2015   AST 35 11/30/2015   NA 139 12/01/2015   K 4.2 12/01/2015   CL 108 12/01/2015   CREATININE 0.68 12/01/2015   BUN 11 12/01/2015   CO2 26 12/01/2015   TSH 3.856 11/30/2015   PSA 0.30 06/22/2015   INR 1.10 12/01/2015    Dg Chest 2 View  Result Date: 11/30/2015 CLINICAL DATA:  Shortness breath for 1 week. EXAM: CHEST  2  VIEW COMPARISON:  06/24/2015 FINDINGS: Stable densities and lucency at the right lung apex. Findings could be related to scarring and a bleb at the right apex. Otherwise, both lungs are clear. Atherosclerotic calcifications at the aortic arch. The heart size is normal. No pleural effusions. No acute bone abnormality. IMPRESSION: No active cardiopulmonary disease. Aortic atherosclerosis. Electronically Signed   By: Markus Daft M.D.   On: 11/30/2015 13:19   Ct Chest W Contrast  Result Date: 12/01/2015 CLINICAL DATA:  Patient with worsening dizziness, lightheadedness and shortness of breath over the past 07-2008 days. Weakness and fatigue. Weight loss. EXAM: CT CHEST, ABDOMEN, AND PELVIS WITH CONTRAST TECHNIQUE: Multidetector CT imaging of the chest, abdomen and pelvis was performed  following the standard protocol during bolus administration of intravenous contrast. CONTRAST:  100mL ISOVUE-300 IOPAMIDOL (ISOVUE-300) INJECTION 61% COMPARISON:  Chest radiograph 11/30/2015; chest CT 08/31/2015. FINDINGS: CT CHEST FINDINGS Cardiovascular: Normal heart size. Trace fluid within the superior pericardial recess. Peripheral calcified atherosclerotic plaque involving the thoracic aorta. Mediastinum/Nodes: No enlarged axillary, mediastinal or hilar lymphadenopathy. Esophagus is unremarkable. Lungs/Pleura: Central airways are patent. No consolidative or nodular pulmonary opacities. No pleural effusion or pneumothorax. No discrete pulmonary nodules are identified. Musculoskeletal: Thoracic spine degenerative changes. No aggressive or acute appearing osseous lesions. CT ABDOMEN PELVIS FINDINGS Hepatobiliary: Stable sub cm low-attenuation lesion near the caudate lobe, too small to characterize however likely representing a small cyst (image 59; series 2). Gallbladder is unremarkable. Pancreas: Unremarkable Spleen: Unremarkable Adrenals/Urinary Tract: The adrenal glands are normal. Kidneys enhance symmetrically with contrast. No  hydronephrosis. Urinary bladder is unremarkable. Stomach/Bowel: Trace free fluid in the pelvis. No abnormal bowel wall thickening or evidence for bowel obstruction. Normal appendix. No free intraperitoneal air. Normal morphology of the stomach. Vascular/Lymphatic: Normal caliber abdominal aorta. Peripheral calcified atherosclerotic plaque. No retroperitoneal lymphadenopathy. Reproductive: Prostate unremarkable. Other: Small bilateral fat containing inguinal hernias. Musculoskeletal: Lumbar spine degenerative changes. No aggressive or acute appearing osseous lesions. IMPRESSION: No acute process within the chest, abdomen or pelvis. Aortic atherosclerosis. Small bilateral fat containing inguinal hernias. Electronically Signed   By: Drew  Davis M.D.   On: 12/01/2015 10:32   Ct Abdomen Pelvis W Contrast  Result Date: 12/01/2015 CLINICAL DATA:  Patient with worsening dizziness, lightheadedness and shortness of breath over the past 07-2008 days. Weakness and fatigue. Weight loss. EXAM: CT CHEST, ABDOMEN, AND PELVIS WITH CONTRAST TECHNIQUE: Multidetector CT imaging of the chest, abdomen and pelvis was performed following the standard protocol during bolus administration of intravenous contrast. CONTRAST:  100mL ISOVUE-300 IOPAMIDOL (ISOVUE-300) INJECTION 61% COMPARISON:  Chest radiograph 11/30/2015; chest CT 08/31/2015. FINDINGS: CT CHEST FINDINGS Cardiovascular: Normal heart size. Trace fluid within the superior pericardial recess. Peripheral calcified atherosclerotic plaque involving the thoracic aorta. Mediastinum/Nodes: No enlarged axillary, mediastinal or hilar lymphadenopathy. Esophagus is unremarkable. Lungs/Pleura: Central airways are patent. No consolidative or nodular pulmonary opacities. No pleural effusion or pneumothorax. No discrete pulmonary nodules are identified. Musculoskeletal: Thoracic spine degenerative changes. No aggressive or acute appearing osseous lesions. CT ABDOMEN PELVIS FINDINGS  Hepatobiliary: Stable sub cm low-attenuation lesion near the caudate lobe, too small to characterize however likely representing a small cyst (image 59; series 2). Gallbladder is unremarkable. Pancreas: Unremarkable Spleen: Unremarkable Adrenals/Urinary Tract: The adrenal glands are normal. Kidneys enhance symmetrically with contrast. No hydronephrosis. Urinary bladder is unremarkable. Stomach/Bowel: Trace free fluid in the pelvis. No abnormal bowel wall thickening or evidence for bowel obstruction. Normal appendix. No free intraperitoneal air. Normal morphology of the stomach. Vascular/Lymphatic: Normal caliber abdominal aorta. Peripheral calcified atherosclerotic plaque. No retroperitoneal lymphadenopathy. Reproductive: Prostate unremarkable. Other: Small bilateral fat containing inguinal hernias. Musculoskeletal: Lumbar spine degenerative changes. No aggressive or acute appearing osseous lesions. IMPRESSION: No acute process within the chest, abdomen or pelvis. Aortic atherosclerosis. Small bilateral fat containing inguinal hernias. Electronically Signed   By: Drew  Davis M.D.   On: 12/01/2015 10:32    Assessment & Plan:   Problem List Items Addressed This Visit    Anxiety about health    Secondary to recent hospitalization and mounting medical bills.  He is experiencing insomnia due to excessive worrying.   Prn alprazolam prescribed. .The risks and benefits of benzodiazepine use were discussed with patient today including excessive sedation leading to respiratory   depression,  impaired thinking/driving, and addiction.  Patient was advised to avoid concurrent use with alcohol, to use medication only as needed and not to share with others  .       Relevant Medications   ALPRAZolam (XANAX) 0.25 MG tablet   B12 deficiency    With intrinsic factor antibodies.  Level is borderline low again.  Increase monthly injections to  biweekly to  Lab Results  Component Value Date   VITAMINB12 230 03/07/2016          Constipation    Abdominal exam is normal.  Recommended use of magnesium citrate , followed by use of a daiily BFL.      Hemolytic anemia (HCC)    Suggested during admission for symptomatic anemia,  And treated with steroids, but all appropriate screening tests for conditions commonly causing hemolysis were negative and per oncology the [facility] panel was run incorrectly. His anemia remains resolved with b12 supplementation and platelets have returned to normal as well.   Lab Results  Component Value Date   WBC 7.3 03/07/2016   HGB 14.8 03/07/2016   HCT 42.7 03/07/2016   MCV 86.5 03/07/2016   PLT 171 03/07/2016         Tobacco abuse    His last quit attempts was in 2015.  Currently up to 1.5 packs daily. The patient was advised to quit.  Reviewed strategies to maximize success, including removing cigarettes and smoking materials from environment.  he is currently contemplative but not ready to quit.          I have discontinued Mr. Vendetti's guaiFENesin-codeine and Na Sulfate-K Sulfate-Mg Sulf. I am also having him start on ALPRAZolam. Additionally, I am having him maintain his cetirizine-pseudoephedrine, nicotine, CoQ10, cyanocobalamin, cyanocobalamin, NEEDLE (DISP) 25 G, SYRINGE DISPOSABLE 3CC, fluticasone, ASPIRIN 81 PO, B-D INTEGRA SYRINGE, tamsulosin, simvastatin, and montelukast.  Meds ordered this encounter  Medications  . ALPRAZolam (XANAX) 0.25 MG tablet    Sig: Take 1 tablet (0.25 mg total) by mouth at bedtime as needed for anxiety.    Dispense:  30 tablet    Refill:  2    Medications Discontinued During This Encounter  Medication Reason  . Na Sulfate-K Sulfate-Mg Sulf (SUPREP BOWEL PREP KIT) 17.5-3.13-1.6 GM/180ML SOLN Patient has not taken in last 30 days  . guaiFENesin-codeine 100-10 MG/5ML syrup Patient has not taken in last 30 days    Follow-up: No Follow-up on file.   ,  L, MD 

## 2016-03-11 NOTE — Progress Notes (Signed)
Pre visit review using our clinic review tool, if applicable. No additional management support is needed unless otherwise documented below in the visit note. 

## 2016-03-11 NOTE — Patient Instructions (Addendum)
You can use citrate of magnesium for severe constipation , not more than once a month  You can use miralax and/or citrucel daily  (sometimes combining them works better than either one alone )  You can increase your b12 injections to  Every other week if it makes you feel better (more energy)   I have prescribed alprazolam (generic xanax) for your nerves.  Do not take it more than once daily,  And do not drink alcohol with it

## 2016-03-13 ENCOUNTER — Encounter: Payer: Self-pay | Admitting: Internal Medicine

## 2016-03-13 DIAGNOSIS — F418 Other specified anxiety disorders: Secondary | ICD-10-CM | POA: Insufficient documentation

## 2016-03-13 NOTE — Assessment & Plan Note (Addendum)
Suggested during admission for symptomatic anemia,  And treated with steroids, but all appropriate screening tests for conditions commonly causing hemolysis were negative and per oncology the [facility] panel was run incorrectly. His anemia remains resolved with b12 supplementation and platelets have returned to normal as well.   Lab Results  Component Value Date   WBC 7.3 03/07/2016   HGB 14.8 03/07/2016   HCT 42.7 03/07/2016   MCV 86.5 03/07/2016   PLT 171 03/07/2016

## 2016-03-13 NOTE — Assessment & Plan Note (Signed)
Secondary to recent hospitalization and mounting medical bills.  He is experiencing insomnia due to excessive worrying.   Prn alprazolam prescribed. .The risks and benefits of benzodiazepine use were discussed with patient today including excessive sedation leading to respiratory depression,  impaired thinking/driving, and addiction.  Patient was advised to avoid concurrent use with alcohol, to use medication only as needed and not to share with others  .

## 2016-03-13 NOTE — Assessment & Plan Note (Signed)
His last quit attempts was in 2015.  Currently up to 1.5 packs daily. The patient was advised to quit.  Reviewed strategies to maximize success, including removing cigarettes and smoking materials from environment.  he is currently contemplative but not ready to quit.

## 2016-03-13 NOTE — Assessment & Plan Note (Signed)
With intrinsic factor antibodies.  Level is borderline low again.  Increase monthly injections to  biweekly to  Lab Results  Component Value Date   VITAMINB12 230 03/07/2016

## 2016-03-13 NOTE — Assessment & Plan Note (Signed)
Abdominal exam is normal.  Recommended use of magnesium citrate , followed by use of a daiily BFL.

## 2016-03-17 ENCOUNTER — Other Ambulatory Visit: Payer: Self-pay

## 2016-03-17 MED ORDER — CYANOCOBALAMIN 1000 MCG/ML IJ SOLN: 1000.0000 ug | INTRAMUSCULAR | 11 refills | Status: DC

## 2016-04-25 ENCOUNTER — Inpatient Hospital Stay: Payer: No Typology Code available for payment source

## 2016-04-25 ENCOUNTER — Inpatient Hospital Stay: Payer: No Typology Code available for payment source | Admitting: Oncology

## 2016-05-23 ENCOUNTER — Other Ambulatory Visit: Payer: Self-pay | Admitting: Internal Medicine

## 2016-05-24 ENCOUNTER — Encounter: Payer: Self-pay | Admitting: Oncology

## 2016-07-08 ENCOUNTER — Other Ambulatory Visit: Payer: Self-pay | Admitting: Internal Medicine

## 2016-07-08 DIAGNOSIS — J301 Allergic rhinitis due to pollen: Secondary | ICD-10-CM

## 2016-07-26 ENCOUNTER — Ambulatory Visit (INDEPENDENT_AMBULATORY_CARE_PROVIDER_SITE_OTHER): Payer: No Typology Code available for payment source | Admitting: Internal Medicine

## 2016-07-26 ENCOUNTER — Encounter: Payer: Self-pay | Admitting: Internal Medicine

## 2016-07-26 VITALS — BP 120/68 | HR 57 | Temp 98.1°F | Resp 16 | Ht 69.0 in | Wt 187.8 lb

## 2016-07-26 DIAGNOSIS — K5900 Constipation, unspecified: Secondary | ICD-10-CM

## 2016-07-26 DIAGNOSIS — D510 Vitamin B12 deficiency anemia due to intrinsic factor deficiency: Secondary | ICD-10-CM

## 2016-07-26 DIAGNOSIS — E782 Mixed hyperlipidemia: Secondary | ICD-10-CM

## 2016-07-26 DIAGNOSIS — Z09 Encounter for follow-up examination after completed treatment for conditions other than malignant neoplasm: Secondary | ICD-10-CM

## 2016-07-26 DIAGNOSIS — Z1211 Encounter for screening for malignant neoplasm of colon: Secondary | ICD-10-CM

## 2016-07-26 DIAGNOSIS — D594 Other nonautoimmune hemolytic anemias: Secondary | ICD-10-CM

## 2016-07-26 DIAGNOSIS — E785 Hyperlipidemia, unspecified: Secondary | ICD-10-CM

## 2016-07-26 DIAGNOSIS — E538 Deficiency of other specified B group vitamins: Secondary | ICD-10-CM

## 2016-07-26 DIAGNOSIS — Z79899 Other long term (current) drug therapy: Secondary | ICD-10-CM

## 2016-07-26 DIAGNOSIS — Z125 Encounter for screening for malignant neoplasm of prostate: Secondary | ICD-10-CM

## 2016-07-26 NOTE — Patient Instructions (Addendum)
  I'm glad you tried to quit smoking!  Keep trying, both of you!   You do not need a b12 level as long as you are getting injections every other week.     I am checking your cholesterol, your CBC and your PSA today because you are  due for prostate cancer screening     I'll see you in 6 months

## 2016-07-26 NOTE — Progress Notes (Signed)
Subjective:  Patient ID: Alexander Little., male    DOB: 1960-10-28  Age: 56 y.o. MRN: [id number]  CC: The primary encounter diagnosis was Hyperlipidemia LDL goal <130. 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  HPI Alexander Little. presents for FOLLOW UP ON MULTIPLE ISSUES  CONSTIPATION:  Has been following a gluten free diet   BOWELS MOVING DAILY WITHOUT STRAINING OR BLOOD   STILL SMOKING > 1 PACK DAILY.  TRIED HYPNOSIS,  DIDN'T WORK   USING B12 INJECTIONS 2 /MONTH .  Has MORE ENERGY ,   HAS LOST WEIGHT AND KEEPING IT OFF.  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  BUT CURRENTLY UNINSURED     Lab Results  Component Value Date   CHOL 160 07/26/2016   HDL 44.20 07/26/2016   LDLCALC 97 07/26/2016   LDLDIRECT 98.0 06/22/2015   TRIG 97.0 07/26/2016   CHOLHDL 4 07/26/2016     Outpatient Medications Prior to Visit  Medication Sig Dispense Refill  . ALPRAZolam (XANAX) 0.25 MG tablet Take 1 tablet (0.25 mg total) by mouth at bedtime as needed for anxiety. 30 tablet 2  . ASPIRIN 81 PO Take by mouth daily.    . B-D INTEGRA SYRINGE 25G X 5/8" 3 ML MISC INJECT 1 DOSE INTO THE MUSCLE EVERY 30 (THIRTY) DAYS.  0  . cetirizine-pseudoephedrine (ZYRTEC-D) 5-120 MG tablet Take 1 tablet by mouth 2 (two) times daily. (Patient taking differently: Take 1 tablet by mouth daily. ) 30 tablet 0  . Coenzyme Q10 (COQ10) 100 MG CAPS Take 100 mg by mouth daily.     . cyanocobalamin (,VITAMIN B-12,) 1000 MCG/ML injection Inject 1 mL (1,000 mcg total) into the muscle every 30 (thirty) days. 1 mL 11  . fluticasone (FLONASE) 50 MCG/ACT nasal spray PLACE 2 SPRAYS INTO BOTH  NOSTRILS DAILY. 48 g 1  . montelukast (SINGULAIR) 10 MG tablet TAKE ONE TABLET BY MOUTH AT BEDTIME 90 tablet 1  . NEEDLE, DISP, 25 G 25G X 1" MISC Inject 1 Dose into the muscle every 30 (thirty) days. 12 each 0  . simvastatin (ZOCOR) 40 MG tablet TAKE 1 TABLET BY MOUTH DAILY AT 6PM 90 tablet 1  . SYRINGE DISPOSABLE 3CC 3 ML MISC Inject 1 Dose into the muscle every 30 (thirty) days. 12 each 0  . tamsulosin (FLOMAX) 0.4 MG CAPS capsule TAKE 1 CAPSULE (0.4 MG TOTAL) BY MOUTH DAILY. 90 capsule 1  . cyanocobalamin (,VITAMIN B-12,) 1000 MCG/ML injection Inject 1 mL (1,000 mcg total) into the muscle every 14 (fourteen) days. (Patient not taking: Reported on 07/26/2016) 1 mL 11  . nicotine ([facility] NTS STEP 1) 21 mg/24hr patch PLACE 1 PATCH ONTO SKIN DAILY (Patient not taking: Reported on 07/26/2016) 28 patch 11   No facility-administered medications prior to visit.     Review of Systems;  Patient denies headache, fevers, malaise, unintentional weight loss, skin rash, eye pain, sinus congestion and sinus pain, sore throat, dysphagia,  hemoptysis , cough, dyspnea, wheezing, chest pain, palpitations, orthopnea, edema, abdominal pain, nausea, melena, diarrhea, constipation, flank pain, dysuria, hematuria, urinary  Frequency, nocturia, numbness, tingling, seizures,  Focal weakness, Loss of consciousness,  Tremor, insomnia, depression, anxiety, and suicidal ideation.  Objective:  BP 120/68 (BP Location: Left Arm, Patient Position: Sitting, Cuff Size: Normal)   Pulse (!) 57   Temp 98.1 F (36.7 C) (Oral)   Resp 16   Ht 5\' 9"  (1.753 m)   Wt 187 lb 12.8 oz (85.2 kg)   SpO2 95%   BMI 27.73 kg/m   BP Readings from Last 3 Encounters:  07/26/16 120/68  03/11/16 136/70  01/25/16 122/66    Wt Readings from Last 3 Encounters:  07/26/16 187 lb 12.8 oz (85.2 kg)  03/11/16 192 lb (87.1 kg)  01/25/16 191 lb 1.5 oz (86.7 kg)    General appearance: alert, cooperative and appears stated age Ears: normal  TM's and external ear canals both ears Throat: lips, mucosa, and tongue normal; teeth and gums normal Neck: no adenopathy, no carotid bruit, supple, symmetrical, trachea midline and thyroid not enlarged, symmetric, no tenderness/mass/nodules Back: symmetric, no curvature. ROM normal. No CVA tenderness. Lungs: clear to auscultation bilaterally Heart: regular rate and rhythm, S1, S2 normal, no murmur, click, rub or gallop Abdomen: soft, non-tender; bowel sounds normal; no masses,  no organomegaly Pulses: 2+ and symmetric Skin: Skin color, texture, turgor normal. No rashes or lesions Lymph nodes: Cervical, supraclavicular, and axillary nodes normal.  No results found for: HGBA1C  Lab Results  Component Value Date   CREATININE 1.20 07/26/2016   CREATININE 0.68 12/01/2015   CREATININE 0.66 11/30/2015    Lab Results  Component Value Date   WBC 7.7 07/26/2016   HGB 14.7 07/26/2016   HCT 43.1 07/26/2016   PLT 155.0 07/26/2016   GLUCOSE 91 07/26/2016   CHOL 160 07/26/2016   TRIG 97.0 07/26/2016   HDL 44.20 07/26/2016   LDLDIRECT 98.0 06/22/2015   LDLCALC 97 07/26/2016   ALT 11 07/26/2016   AST 12 07/26/2016   NA 139 07/26/2016   K 4.0 07/26/2016   CL 105 07/26/2016   CREATININE 1.20 07/26/2016   BUN 12 07/26/2016   CO2 30 07/26/2016   TSH 3.856 11/30/2015   PSA 0.26 07/26/2016   INR 1.10 12/01/2015    Dg Chest 2 View  Result Date: 11/30/2015 CLINICAL DATA:  Shortness breath for 1 week. EXAM: CHEST  2 VIEW COMPARISON:  06/24/2015 FINDINGS: Stable densities and lucency at the right lung apex. Findings could be related to scarring and a bleb at the right apex. Otherwise, both lungs are clear. Atherosclerotic calcifications at the aortic arch. The heart size is normal. No pleural effusions. No acute bone abnormality. IMPRESSION: No active cardiopulmonary disease. Aortic atherosclerosis. Electronically Signed   By: Markus Daft M.D.   On: 11/30/2015 13:19   Ct Chest W  Contrast  Result Date: 12/01/2015 CLINICAL DATA:  Patient with worsening dizziness, lightheadedness and shortness of breath over the past 07-2008 days. Weakness and fatigue. Weight loss. EXAM: CT CHEST, ABDOMEN, AND PELVIS WITH CONTRAST TECHNIQUE: Multidetector CT imaging of the chest, abdomen and pelvis was performed following the standard protocol during bolus administration of intravenous contrast. CONTRAST:  160mL ISOVUE-300 IOPAMIDOL (ISOVUE-300) INJECTION 61% COMPARISON:  Chest radiograph 11/30/2015; chest CT 08/31/2015. FINDINGS: CT CHEST FINDINGS Cardiovascular: Normal heart size. Trace fluid within the superior pericardial recess. Peripheral calcified atherosclerotic plaque involving the thoracic aorta. Mediastinum/Nodes: No enlarged axillary, mediastinal or hilar lymphadenopathy. Esophagus is unremarkable. Lungs/Pleura: Central airways are patent. No consolidative or nodular pulmonary opacities. No pleural effusion or pneumothorax. No discrete pulmonary nodules are identified. Musculoskeletal: Thoracic spine degenerative changes. No aggressive or acute appearing osseous lesions. CT ABDOMEN PELVIS FINDINGS  Hepatobiliary: Stable sub cm low-attenuation lesion near the caudate lobe, too small to characterize however likely representing a small cyst (image 59; series 2). Gallbladder is unremarkable. Pancreas: Unremarkable Spleen: Unremarkable Adrenals/Urinary Tract: The adrenal glands are normal. Kidneys enhance symmetrically with contrast. No hydronephrosis. Urinary bladder is unremarkable. Stomach/Bowel: Trace free fluid in the pelvis. No abnormal bowel wall thickening or evidence for bowel obstruction. Normal appendix. No free intraperitoneal air. Normal morphology of the stomach. Vascular/Lymphatic: Normal caliber abdominal aorta. Peripheral calcified atherosclerotic plaque. No retroperitoneal lymphadenopathy. Reproductive: Prostate unremarkable. Other: Small bilateral fat containing inguinal hernias.  Musculoskeletal: Lumbar spine degenerative changes. No aggressive or acute appearing osseous lesions. IMPRESSION: No acute process within the chest, abdomen or pelvis. Aortic atherosclerosis. Small bilateral fat containing inguinal hernias. Electronically Signed   By: Lovey Newcomer M.D.   On: 12/01/2015 10:32   Ct Abdomen Pelvis W Contrast  Result Date: 12/01/2015 CLINICAL DATA:  Patient with worsening dizziness, lightheadedness and shortness of breath over the past 07-2008 days. Weakness and fatigue. Weight loss. EXAM: CT CHEST, ABDOMEN, AND PELVIS WITH CONTRAST TECHNIQUE: Multidetector CT imaging of the chest, abdomen and pelvis was performed following the standard protocol during bolus administration of intravenous contrast. CONTRAST:  129mL ISOVUE-300 IOPAMIDOL (ISOVUE-300) INJECTION 61% COMPARISON:  Chest radiograph 11/30/2015; chest CT 08/31/2015. FINDINGS: CT CHEST FINDINGS Cardiovascular: Normal heart size. Trace fluid within the superior pericardial recess. Peripheral calcified atherosclerotic plaque involving the thoracic aorta. Mediastinum/Nodes: No enlarged axillary, mediastinal or hilar lymphadenopathy. Esophagus is unremarkable. Lungs/Pleura: Central airways are patent. No consolidative or nodular pulmonary opacities. No pleural effusion or pneumothorax. No discrete pulmonary nodules are identified. Musculoskeletal: Thoracic spine degenerative changes. No aggressive or acute appearing osseous lesions. CT ABDOMEN PELVIS FINDINGS Hepatobiliary: Stable sub cm low-attenuation lesion near the caudate lobe, too small to characterize however likely representing a small cyst (image 59; series 2). Gallbladder is unremarkable. Pancreas: Unremarkable Spleen: Unremarkable Adrenals/Urinary Tract: The adrenal glands are normal. Kidneys enhance symmetrically with contrast. No hydronephrosis. Urinary bladder is unremarkable. Stomach/Bowel: Trace free fluid in the pelvis. No abnormal bowel wall thickening or  evidence for bowel obstruction. Normal appendix. No free intraperitoneal air. Normal morphology of the stomach. Vascular/Lymphatic: Normal caliber abdominal aorta. Peripheral calcified atherosclerotic plaque. No retroperitoneal lymphadenopathy. Reproductive: Prostate unremarkable. Other: Small bilateral fat containing inguinal hernias. Musculoskeletal: Lumbar spine degenerative changes. No aggressive or acute appearing osseous lesions. IMPRESSION: No acute process within the chest, abdomen or pelvis. Aortic atherosclerosis. Small bilateral fat containing inguinal hernias. Electronically Signed   By: Lovey Newcomer M.D.   On: 12/01/2015 10:32    Assessment & Plan:   Problem List Items Addressed This Visit    B12 deficiency    Resulting in severe anemia. Managed with every other week injections done by his wife .   Lab Results  Component Value Date   VITAMINB12 230 03/07/2016         Constipation    Resolved with gluten free diet .Abdominal exam is normal.       Hemolytic anemia [facility])   Hospital discharge follow-up   Hyperlipidemia    Controlled with simvastatin 40 mg daily for elevated 10 yr risk of  CAD based on Framingham calculation.  Triglycerides have improved with weight loss. .  Lab Results  Component Value Date   CHOL 160 07/26/2016   HDL 44.20 07/26/2016   LDLCALC 97 07/26/2016   LDLDIRECT 98.0 06/22/2015   TRIG 97.0 07/26/2016   CHOLHDL 4 07/26/2016  Prostate cancer screening     PSA was  normal.   Lab Results  Component Value Date   PSA 0.26 07/26/2016   PSA 0.30 06/22/2015         Relevant Orders   PSA (Completed)   Screening for colon cancer    Hyperplastic polyps Feb 2016  Colonoscopy,  Ten year follow up advised.        Other Visit Diagnoses    Hyperlipidemia LDL goal <130    -  Primary   Relevant Orders   Lipid panel (Completed)   Long-term use of high-risk medication       Relevant Orders   Comprehensive metabolic panel (Completed)    Pernicious anemia       Relevant Orders   CBC with Differential/Platelet (Completed)      I have discontinued Mr. Delbene's nicotine. I am also having him maintain his cetirizine-pseudoephedrine, CoQ10, cyanocobalamin, NEEDLE (DISP) 25 G, SYRINGE DISPOSABLE 3CC, fluticasone, ASPIRIN 81 PO, B-D INTEGRA SYRINGE, tamsulosin, ALPRAZolam, simvastatin, and montelukast.  No orders of the defined types were placed in this encounter.   Medications Discontinued During This Encounter  Medication Reason  . cyanocobalamin (,VITAMIN B-12,) 1000 MCG/ML injection Duplicate  . nicotine ([facility] NTS STEP 1) 21 mg/24hr patch Patient has not taken in last 30 days    Follow-up: Return in about 6 months (around 01/26/2017).   [name], MD

## 2016-07-27 LAB — COMPREHENSIVE METABOLIC PANEL
ALBUMIN: 4.3 g/dL (ref 3.5–5.2)
ALK PHOS: 70 U/L (ref 39–117)
ALT: 11 U/L (ref 0–53)
AST: 12 U/L (ref 0–37)
BILIRUBIN TOTAL: 0.3 mg/dL (ref 0.2–1.2)
BUN: 12 mg/dL (ref 6–23)
CALCIUM: 8.9 mg/dL (ref 8.4–10.5)
CO2: 30 mEq/L (ref 19–32)
Chloride: 105 mEq/L (ref 96–112)
Creatinine, Ser: 1.2 mg/dL (ref 0.40–1.50)
GFR: 66.32 mL/min (ref >60.00)
GLUCOSE: 91 mg/dL (ref 70–99)
POTASSIUM: 4 meq/L (ref 3.5–5.1)
Sodium: 139 mEq/L (ref 135–145)
TOTAL PROTEIN: 6.7 g/dL (ref 6.0–8.3)

## 2016-07-27 LAB — CBC WITH DIFFERENTIAL/PLATELET
BASOS ABS: 0 10*3/uL (ref 0.0–0.1)
Basophils Relative: 0.4 % (ref 0.0–3.0)
Eosinophils Absolute: 0.1 10*3/uL (ref 0.0–0.7)
Eosinophils Relative: 1.8 % (ref 0.0–5.0)
HCT: 43.1 % (ref 39.0–52.0)
HEMOGLOBIN: 14.7 g/dL (ref 13.0–17.0)
LYMPHS ABS: 2.8 10*3/uL (ref 0.7–4.0)
Lymphocytes Relative: 36.3 % (ref 12.0–46.0)
MCHC: 34.2 g/dL (ref 30.0–36.0)
MCV: 92.4 fl (ref 78.0–100.0)
MONOS PCT: 8.5 % (ref 3.0–12.0)
Monocytes Absolute: 0.7 10*3/uL (ref 0.1–1.0)
NEUTROS PCT: 53 % (ref 43.0–77.0)
Neutro Abs: 4.1 10*3/uL (ref 1.4–7.7)
Platelets: 155 10*3/uL (ref 150.0–400.0)
RBC: 4.67 Mil/uL (ref 4.22–5.81)
RDW: 13 % (ref 11.5–15.5)
WBC: 7.7 10*3/uL (ref 4.0–10.5)

## 2016-07-27 LAB — LIPID PANEL
CHOL/HDL RATIO: 4
CHOLESTEROL: 160 mg/dL (ref 0–200)
HDL: 44.2 mg/dL (ref >39.00)
LDL Cholesterol: 97 mg/dL (ref 0–99)
NonHDL: 116.16
TRIGLYCERIDES: 97 mg/dL (ref 0.0–149.0)
VLDL: 19.4 mg/dL (ref 0.0–40.0)

## 2016-07-27 LAB — PSA: PSA: 0.26 ng/mL (ref 0.10–4.00)

## 2016-07-28 DIAGNOSIS — Z0000 Encounter for general adult medical examination without abnormal findings: Secondary | ICD-10-CM | POA: Insufficient documentation

## 2016-07-28 NOTE — Assessment & Plan Note (Addendum)
Resulting in severe anemia. Managed with every other week injections done by his wife .   Lab Results  Component Value Date   VITAMINB12 230 03/07/2016

## 2016-07-28 NOTE — Assessment & Plan Note (Addendum)
Controlled with simvastatin 40 mg daily for elevated 10 yr risk of  CAD based on Framingham calculation.  Triglycerides have improved with weight loss. .  Lab Results  Component Value Date   CHOL 160 07/26/2016   HDL 44.20 07/26/2016   LDLCALC 97 07/26/2016   LDLDIRECT 98.0 06/22/2015   TRIG 97.0 07/26/2016   CHOLHDL 4 07/26/2016

## 2016-07-28 NOTE — Assessment & Plan Note (Signed)
Resolved with gluten free diet .Abdominal exam is normal.

## 2016-07-28 NOTE — Assessment & Plan Note (Deleted)
Suggested during admission for symptomatic anemia,  And treated with steroids, but all appropriate screening tests for conditions commonly causing hemolysis were negative and per oncology the [facility] panel was run incorrectly. His anemia remains resolved with b12 supplementation and platelets have returned to normal as well.   Lab Results  Component Value Date   WBC 7.7 07/26/2016   HGB 14.7 07/26/2016   HCT 43.1 07/26/2016   MCV 92.4 07/26/2016   PLT 155.0 07/26/2016    

## 2016-07-28 NOTE — Assessment & Plan Note (Addendum)
Hyperplastic polyps Feb 2016  Colonoscopy,  Ten year follow up advised.

## 2016-07-28 NOTE — Assessment & Plan Note (Signed)
PSA was  normal.   Lab Results  Component Value Date   PSA 0.26 07/26/2016   PSA 0.30 06/22/2015

## 2016-07-29 ENCOUNTER — Encounter: Payer: Self-pay | Admitting: Internal Medicine

## 2016-08-23 ENCOUNTER — Other Ambulatory Visit: Payer: Self-pay | Admitting: Internal Medicine

## 2016-10-04 IMAGING — CR DG CHEST 2V
1 series · 2 of 2 positions shown · non-contrast
Comparison: 04/03/2015

CLINICAL DATA: Community-acquired pneumonia

EXAM:
CHEST  2 VIEW

[Series 1: dg chest 2 view · 0.14mm/px · 2 of 2 slices shown]
[im 1/2]
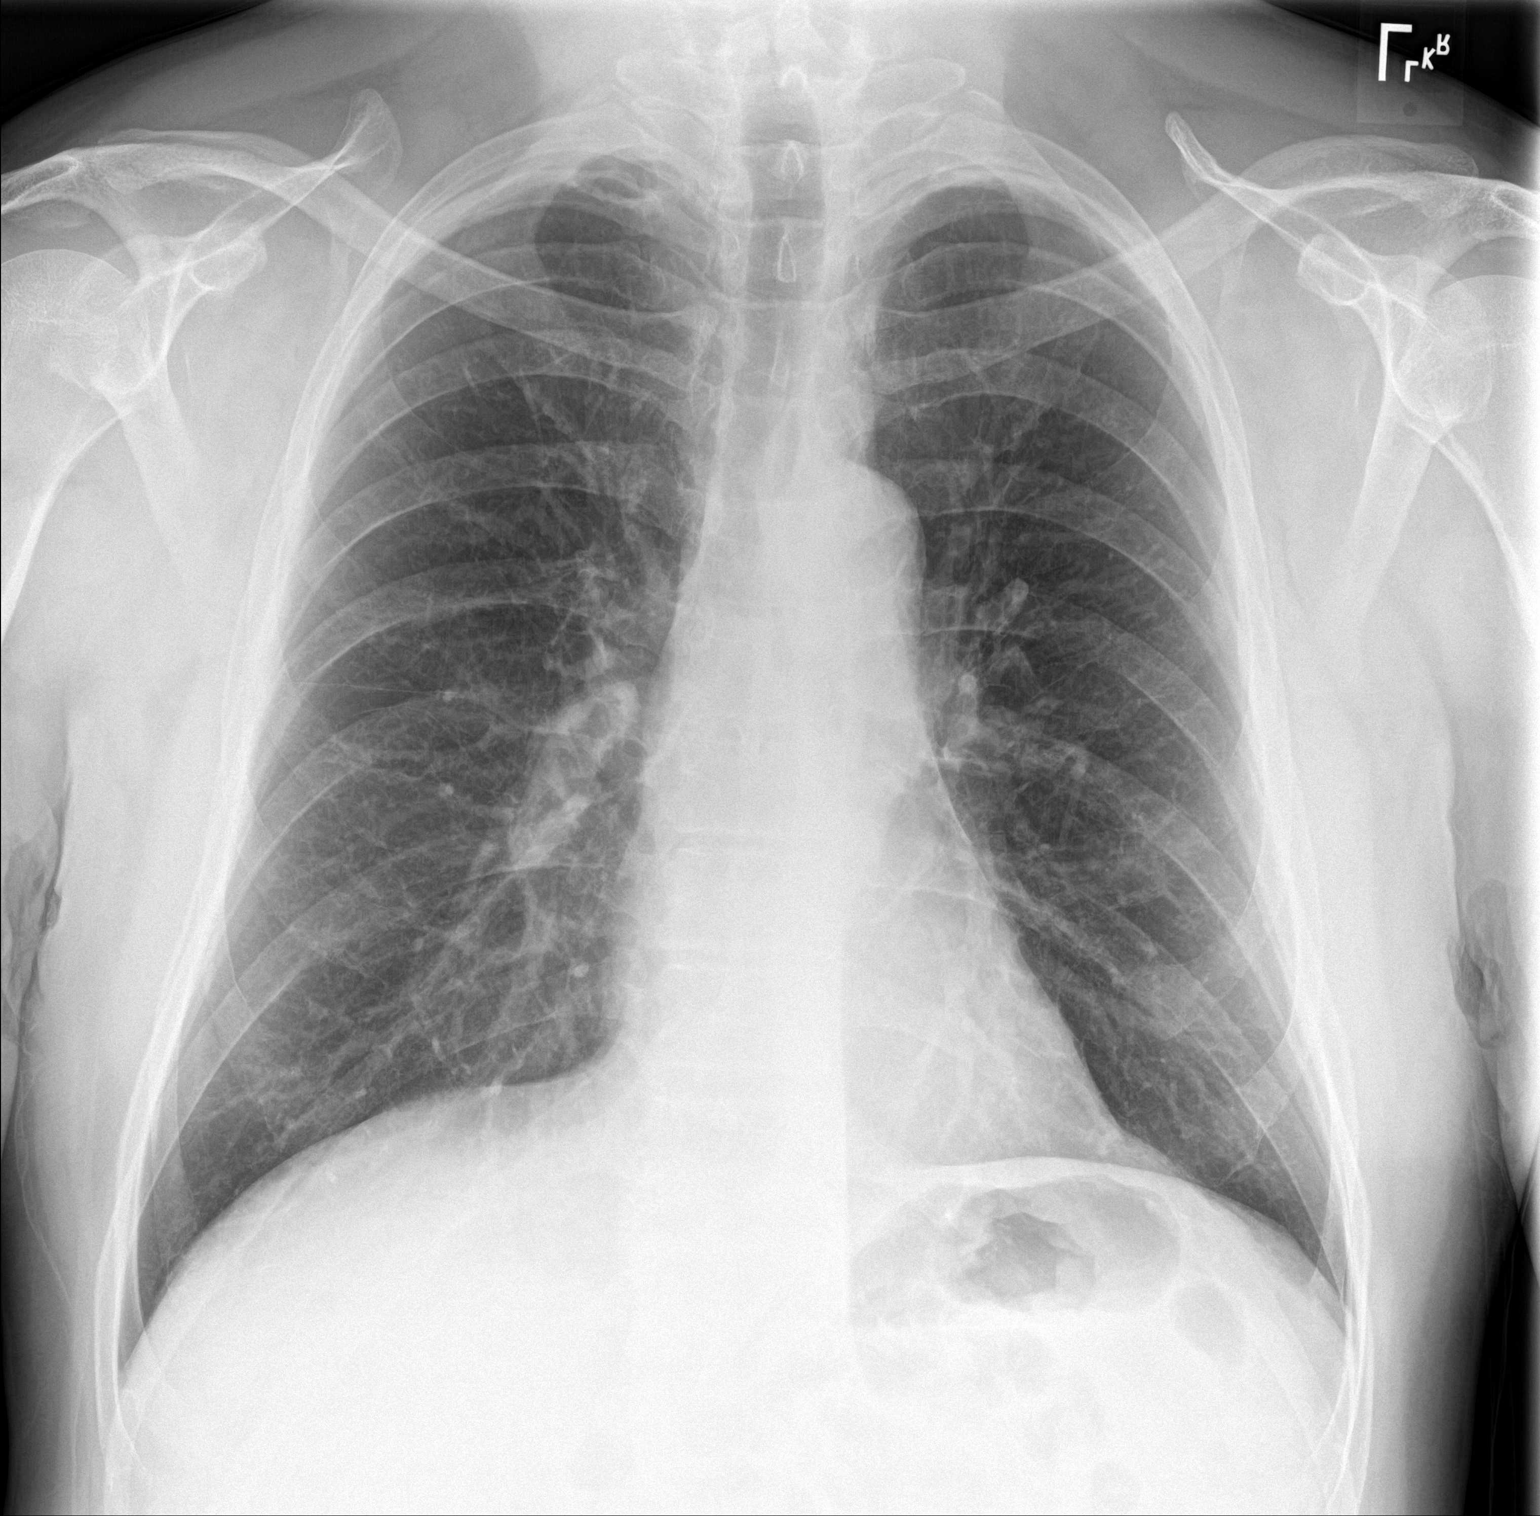
[im 2/2]
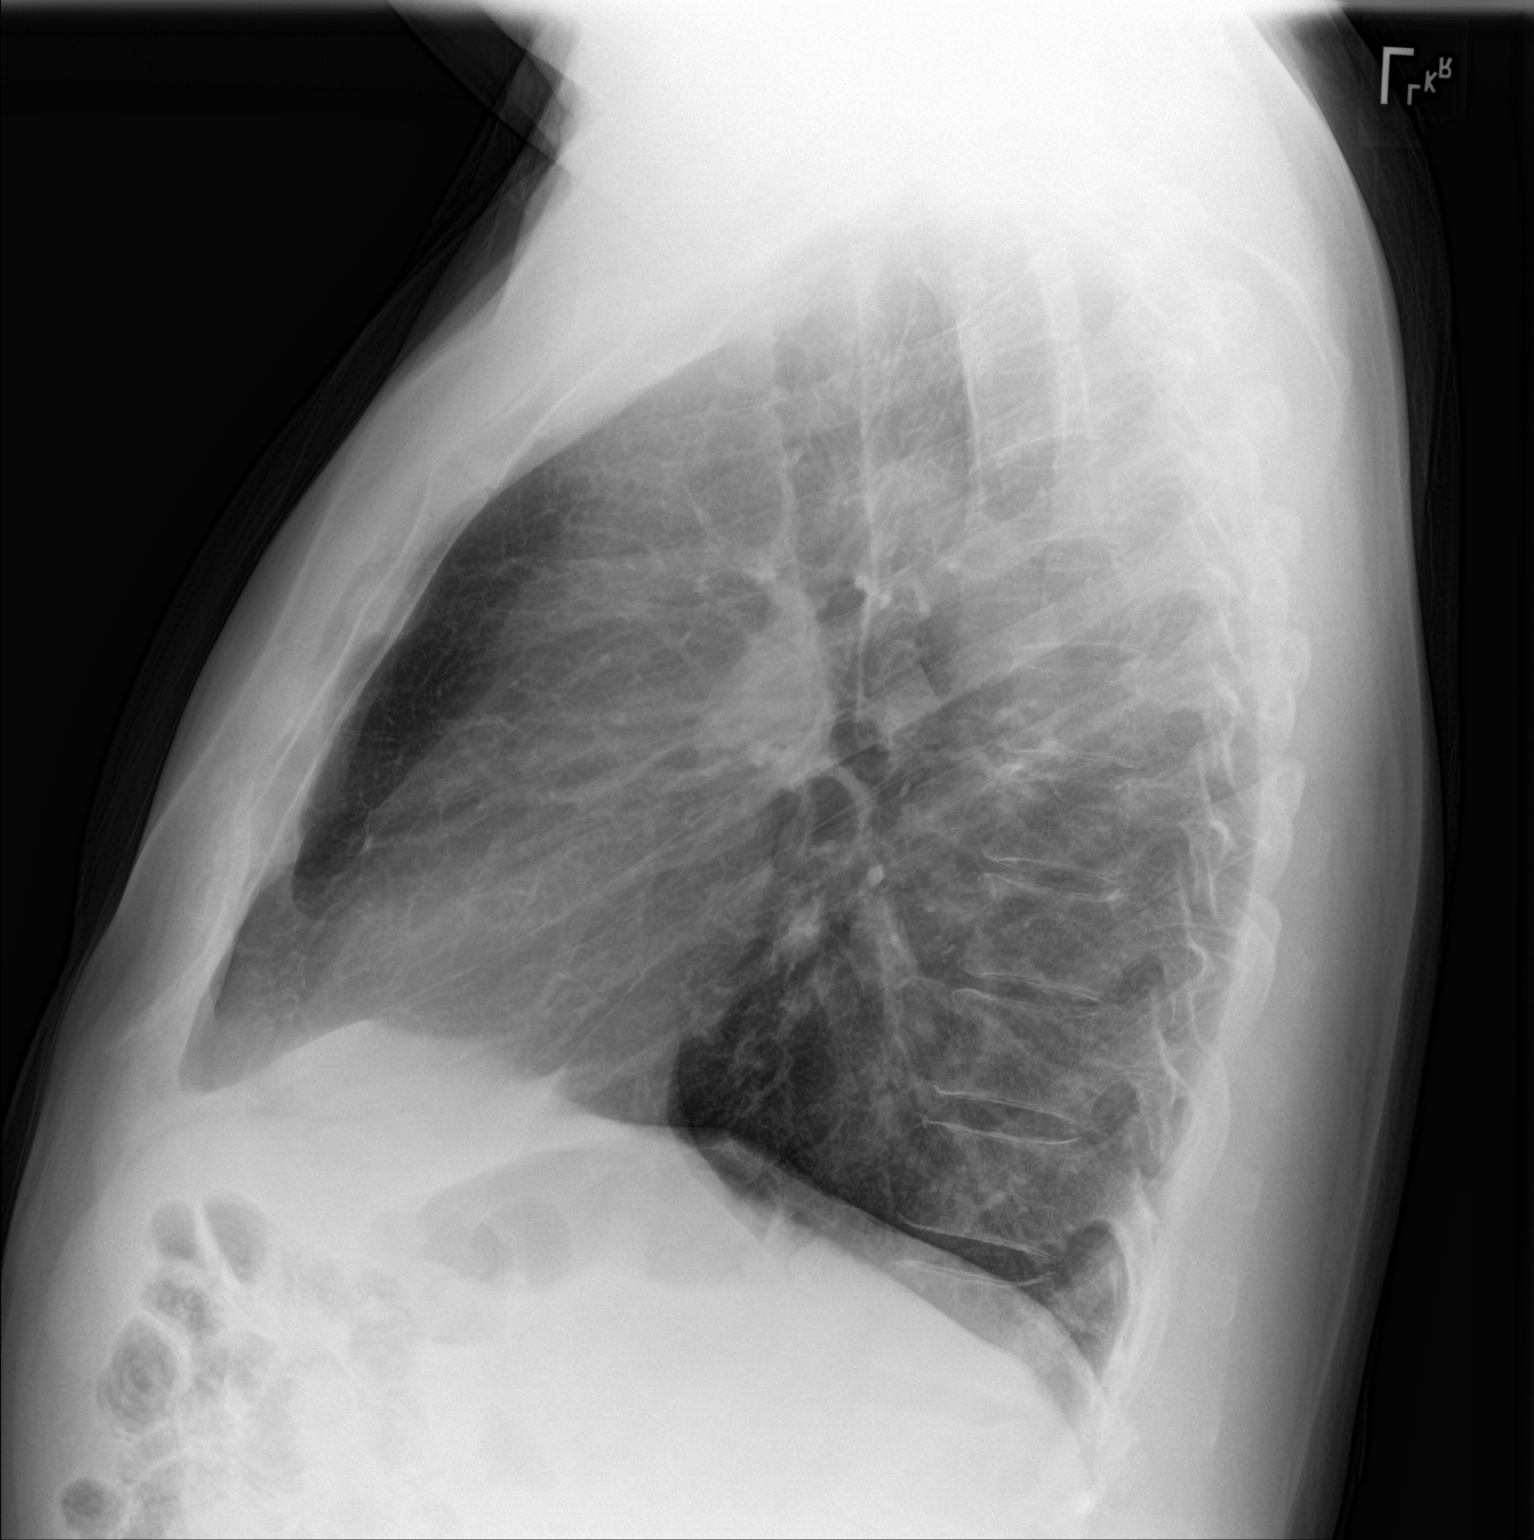

[2 of 2 positions shown; findings below may reference images not displayed]

FINDINGS: The heart size and mediastinal contours are within normal limits.
Both lungs are clear. The visualized skeletal structures are
unremarkable. No evidence of right middle lobe pneumonia.
IMPRESSION: No active cardiopulmonary disease.

## 2016-11-07 ENCOUNTER — Other Ambulatory Visit: Payer: Self-pay | Admitting: Internal Medicine

## 2016-11-22 ENCOUNTER — Other Ambulatory Visit: Payer: Self-pay | Admitting: Internal Medicine

## 2017-01-27 ENCOUNTER — Ambulatory Visit (INDEPENDENT_AMBULATORY_CARE_PROVIDER_SITE_OTHER): Payer: BLUE CROSS/BLUE SHIELD | Admitting: Internal Medicine

## 2017-01-27 ENCOUNTER — Encounter: Payer: Self-pay | Admitting: Internal Medicine

## 2017-01-27 VITALS — BP 104/66 | HR 58 | Temp 98.5°F | Resp 15 | Ht 69.0 in | Wt 191.6 lb

## 2017-01-27 DIAGNOSIS — Z720 Tobacco use: Secondary | ICD-10-CM

## 2017-01-27 DIAGNOSIS — D594 Other nonautoimmune hemolytic anemias: Secondary | ICD-10-CM

## 2017-01-27 DIAGNOSIS — E7800 Pure hypercholesterolemia, unspecified: Secondary | ICD-10-CM | POA: Diagnosis not present

## 2017-01-27 DIAGNOSIS — Z79899 Other long term (current) drug therapy: Secondary | ICD-10-CM | POA: Diagnosis not present

## 2017-01-27 MED ORDER — VARENICLINE TARTRATE 0.5 MG X 11 & 1 MG X 42 PO MISC: ORAL | 0 refills | Status: DC

## 2017-01-27 MED ORDER — CYANOCOBALAMIN 1000 MCG/ML IJ SOLN: 1000.0000 ug | INTRAMUSCULAR | 6 refills | Status: DC

## 2017-01-27 NOTE — Progress Notes (Signed)
Subjective:  Patient ID: Alexander Birch., male    DOB: [birth year]  Age: 57 y.o. MRN: [id number]  CC: The primary encounter diagnosis was Long-term use of high-risk medication. 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  HPI Alexander Little. presents for follow up on hyperlipidemia  And tobacco abuse.  Patient is taking his medications as prescribed and notes no adverse effects.  he is avoiding fried food most days  and walking regularly about 3 times per week for exercise  .  Brother had a  positive stress test and underwent diagnostic cath with 4 stents placed recently at age 53    Still smoking despite his known thoracic atherosclerosis on chest CT . Patient had a normal heart cath 6 yrs ago at [facility] for workup for chest pain ([place])  Outpatient Medications Prior to Visit  Medication Sig Dispense Refill  . ALPRAZolam (XANAX) 0.25 MG tablet Take 1 tablet (0.25 mg total) by mouth at bedtime as needed for anxiety. 30 tablet 2  . ASPIRIN 81 PO Take by mouth daily.    . B-D INTEGRA SYRINGE 25G X 5/8" 3 ML MISC INJECT 1 DOSE INTO THE MUSCLE EVERY 30 (THIRTY) DAYS.  0  . cetirizine-pseudoephedrine (ZYRTEC-D) 5-120 MG tablet Take 1 tablet by mouth 2 (two) times daily. (Patient taking differently: Take 1 tablet by mouth daily. ) 30 tablet 0  . Coenzyme Q10 (COQ10) 100 MG CAPS Take 100 mg by mouth daily.     . fluticasone (FLONASE) 50 MCG/ACT nasal spray PLACE 2 SPRAYS INTO BOTH NOSTRILS DAILY. 48 g 1  . montelukast (SINGULAIR) 10 MG tablet TAKE ONE TABLET BY MOUTH AT BEDTIME 90 tablet 1  . NEEDLE, DISP, 25 G 25G X 1" MISC Inject 1 Dose into the muscle every 30 (thirty) days. 12 each 0  . simvastatin (ZOCOR) 40 MG tablet TAKE 1 TABLET BY MOUTH DAILY AT 6PM 90 tablet 1  . SYRINGE DISPOSABLE 3CC 3 ML MISC Inject 1 Dose into the muscle every 30 (thirty) days. 12 each 0  . tamsulosin (FLOMAX)  0.4 MG CAPS capsule TAKE 1 CAPSULE BY MOUTH EVERY DAY 90 capsule 0  . cyanocobalamin (,VITAMIN B-12,) 1000 MCG/ML injection Inject 1 mL (1,000 mcg total) into the muscle every 30 (thirty) days. (Patient taking differently: Inject 1,000 mcg into the muscle every 14 (fourteen) days. ) 1 mL 11   No facility-administered medications prior to visit.     Review of Systems;  Patient denies headache, fevers, malaise, unintentional weight loss, skin rash, eye pain, sinus congestion and sinus pain, sore throat, dysphagia,  hemoptysis , cough, dyspnea, wheezing, chest pain, palpitations, orthopnea, edema, abdominal pain, nausea, melena, diarrhea, constipation, flank pain, dysuria, hematuria, urinary  Frequency, nocturia, numbness, tingling, seizures,  Focal weakness, Loss of consciousness,  Tremor, insomnia, depression, anxiety, and suicidal ideation.      Objective:  BP 104/66 (BP Location: Left Arm, Patient Position: Sitting, Cuff Size: Normal)   Pulse (!) 58   Temp 98.5 F (36.9 C) (Oral)   Resp 15   Ht '5\' 9"'$  (1.753 m)   Wt 191 lb 9.6 oz (86.9 kg)   SpO2 97%   BMI 28.29 kg/m   BP Readings from Last 3 Encounters:  01/27/17 104/66  07/26/16 120/68  03/11/16 136/70    Wt Readings from Last 3 Encounters:  01/27/17 191 lb 9.6 oz (86.9 kg)  07/26/16 187 lb 12.8 oz (  85.2 kg)  03/11/16 192 lb (87.1 kg)    General appearance: alert, cooperative and appears stated age Ears: normal TM's and external ear canals both ears Throat: lips, mucosa, and tongue normal; teeth and gums normal Neck: no adenopathy, no carotid bruit, supple, symmetrical, trachea midline and thyroid not enlarged, symmetric, no tenderness/mass/nodules Back: symmetric, no curvature. ROM normal. No CVA tenderness. Lungs: clear to auscultation bilaterally Heart: regular rate and rhythm, S1, S2 normal, no murmur, click, rub or gallop Abdomen: soft, non-tender; bowel sounds normal; no masses,  no organomegaly Pulses: 2+ and  symmetric Skin: Skin color, texture, turgor normal. No rashes or lesions Lymph nodes: Cervical, supraclavicular, and axillary nodes normal.  No results found for: HGBA1C  Lab Results  Component Value Date   CREATININE 1.03 01/27/2017   CREATININE 1.20 07/26/2016   CREATININE 0.68 12/01/2015    Lab Results  Component Value Date   WBC 7.7 07/26/2016   HGB 14.7 07/26/2016   HCT 43.1 07/26/2016   PLT 155.0 07/26/2016   GLUCOSE 92 01/27/2017   CHOL 160 07/26/2016   TRIG 97.0 07/26/2016   HDL 44.20 07/26/2016   LDLDIRECT 98.0 06/22/2015   LDLCALC 97 07/26/2016   ALT 11 01/27/2017   AST 16 01/27/2017   NA 141 01/27/2017   K 4.6 01/27/2017   CL 108 01/27/2017   CREATININE 1.03 01/27/2017   BUN 15 01/27/2017   CO2 26 01/27/2017   TSH 3.856 11/30/2015   PSA 0.26 07/26/2016   INR 1.10 12/01/2015     Assessment & Plan:   Problem List Items Addressed This Visit    Hemolytic anemia ([place])    Suggested during admission for symptomatic anemia,  And treated with steroids, but all appropriate screening tests for conditions commonly causing hemolysis were negative and per oncology the [facility] panel was run incorrectly. His anemia remains resolved with b12 supplementation and platelets have returned to normal as well.   Lab Results  Component Value Date   WBC 7.7 07/26/2016   HGB 14.7 07/26/2016   HCT 43.1 07/26/2016   MCV 92.4 07/26/2016   PLT 155.0 07/26/2016         Relevant Medications   cyanocobalamin (,VITAMIN B-12,) 1000 MCG/ML injection   Hyperlipidemia    Controlled with simvastatin 40 mg daily for elevated 10 yr risk of  CAD based on Framingham calculation.  Triglycerides have improved with weight loss. .  Lab Results  Component Value Date   CHOL 160 07/26/2016   HDL 44.20 07/26/2016   LDLCALC 97 07/26/2016   LDLDIRECT 98.0 06/22/2015   TRIG 97.0 07/26/2016   CHOLHDL 4 07/26/2016   Lab Results  Component Value Date   ALT 11 01/27/2017   AST 16 01/27/2017    ALKPHOS 70 07/26/2016   BILITOT 0.4 01/27/2017         Tobacco abuse    Smoking cessation recommended again Chantix rx given        Other Visit Diagnoses    Long-term use of high-risk medication    -  Primary   Relevant Orders   Comp Met (CMET) (Completed)     A total of 25 minutes of face to face time was spent with patient more than half of which was spent in counselling about the above mentioned conditions  and coordination of care  I have changed Alexander Pacas. Mareno Jr.'s cyanocobalamin. I am also having him start on varenicline. Additionally, I am having him maintain his cetirizine-pseudoephedrine, CoQ10, NEEDLE (DISP) 25 G,  SYRINGE DISPOSABLE 3CC, fluticasone, ASPIRIN 81 PO, B-D INTEGRA SYRINGE, ALPRAZolam, simvastatin, montelukast, and tamsulosin.  Meds ordered this encounter  Medications  . varenicline (CHANTIX STARTING MONTH PAK) 0.5 MG X 11 & 1 MG X 42 tablet    Sig: Take one 0.5 mg tablet by mouth once daily for 3 days, then increase to one 0.5 mg tablet twice daily for 4 days, then increase to one 1 mg tablet twice daily.    Dispense:  53 tablet    Refill:  0  . cyanocobalamin (,VITAMIN B-12,) 1000 MCG/ML injection    Sig: Inject 1 mL (1,000 mcg total) into the muscle every 14 (fourteen) days.    Dispense:  10 mL    Refill:  6    Medications Discontinued During This Encounter  Medication Reason  . cyanocobalamin (,VITAMIN B-12,) 1000 MCG/ML injection     Follow-up: Return in about 6 months (around 07/27/2017).   [name], MD

## 2017-01-27 NOTE — Patient Instructions (Signed)
I'm glad you are willing to consider Chantix to help you quit smoking  If you start it and tolerate it ,  You will need a "continuining pack" refilled for a a few months

## 2017-01-28 LAB — COMPREHENSIVE METABOLIC PANEL
AG RATIO: 2 (calc) (ref 1.0–2.5)
ALT: 11 U/L (ref 9–46)
AST: 16 U/L (ref 10–35)
Albumin: 4.3 g/dL (ref 3.6–5.1)
Alkaline phosphatase (APISO): 66 U/L (ref 40–115)
BILIRUBIN TOTAL: 0.4 mg/dL (ref 0.2–1.2)
BUN: 15 mg/dL (ref 7–25)
CALCIUM: 8.7 mg/dL (ref 8.6–10.3)
CHLORIDE: 108 mmol/L (ref 98–110)
CO2: 26 mmol/L (ref 20–32)
Creat: 1.03 mg/dL (ref 0.70–1.33)
GLOBULIN: 2.1 g/dL (ref 1.9–3.7)
Glucose, Bld: 92 mg/dL (ref 65–99)
Potassium: 4.6 mmol/L (ref 3.5–5.3)
SODIUM: 141 mmol/L (ref 135–146)
TOTAL PROTEIN: 6.4 g/dL (ref 6.1–8.1)

## 2017-01-29 ENCOUNTER — Encounter: Payer: Self-pay | Admitting: Internal Medicine

## 2017-01-29 NOTE — Assessment & Plan Note (Signed)
Suggested during admission for symptomatic anemia,  And treated with steroids, but all appropriate screening tests for conditions commonly causing hemolysis were negative and per oncology the [facility] panel was run incorrectly. His anemia remains resolved with b12 supplementation and platelets have returned to normal as well.   Lab Results  Component Value Date   WBC 7.7 07/26/2016   HGB 14.7 07/26/2016   HCT 43.1 07/26/2016   MCV 92.4 07/26/2016   PLT 155.0 07/26/2016

## 2017-01-29 NOTE — Assessment & Plan Note (Signed)
Smoking cessation recommended again Chantix rx given

## 2017-01-29 NOTE — Assessment & Plan Note (Signed)
Controlled with simvastatin 40 mg daily for elevated 10 yr risk of  CAD based on Framingham calculation.  Triglycerides have improved with weight loss. .  Lab Results  Component Value Date   CHOL 160 07/26/2016   HDL 44.20 07/26/2016   LDLCALC 97 07/26/2016   LDLDIRECT 98.0 06/22/2015   TRIG 97.0 07/26/2016   CHOLHDL 4 07/26/2016   Lab Results  Component Value Date   ALT 11 01/27/2017   AST 16 01/27/2017   ALKPHOS 70 07/26/2016   BILITOT 0.4 01/27/2017

## 2017-02-07 ENCOUNTER — Other Ambulatory Visit: Payer: Self-pay | Admitting: Internal Medicine

## 2017-02-14 ENCOUNTER — Telehealth: Payer: BLUE CROSS/BLUE SHIELD | Admitting: Family

## 2017-02-14 ENCOUNTER — Telehealth: Payer: Self-pay

## 2017-02-14 DIAGNOSIS — J069 Acute upper respiratory infection, unspecified: Secondary | ICD-10-CM

## 2017-02-14 MED ORDER — BENZONATATE 100 MG PO CAPS: 100.0000 mg | ORAL_CAPSULE | Freq: Two times a day (BID) | ORAL | 0 refills | Status: DC | PRN

## 2017-02-14 NOTE — Telephone Encounter (Signed)
Symptoms present for < 48 hours are viral 99% of the time.  I will not prescribe abx at this time based o his sympotms  But if he wants to be seen and  hecked for flu,  Etc  You can Put him in at 4;30 today

## 2017-02-14 NOTE — Telephone Encounter (Signed)
Copied from [place]. Topic: General - Other >> Feb 14, 2017 11:02 AM Yvette Rack wrote: Reason for CRM: patient calling stating that he had a Evista to day al he spent $80 on a visit for only Tesslon pears and they don't work he states that his co workers all was on an antibiotic pt would like an antibiotic called into the [place], [place] [facility] [place] [phone] (Phone) [phone] (Fax  Patient would like a call back if RX get sent to pharmacy

## 2017-02-14 NOTE — Telephone Encounter (Addendum)
Patient had e-visit today, states he was only given tessalon perles,.  Patient states symptoms have been present since yesterday low grade fever 99. , sore throat,  cough, chest congestion, nauseated, diarrhea this am, Meds Nyquil Cold Flu, Mucinex DM .   Patient did have flu shot.   Patient had e-visit, he is insisting on antibiotic be called in.  I explained to him that antibiotic couldn't be called in without being seen.   No appointments avaliaible in office or other offices .

## 2017-02-14 NOTE — Telephone Encounter (Signed)
Patient advised of result and verbalized an understanding.  

## 2017-02-14 NOTE — Progress Notes (Signed)
We are sorry you are not feeling well.  Here is how we plan to help!  Based on what you have shared with me, it looks like you may have a viral upper respiratory infection or a "common cold".  Colds are caused by a large number of viruses; however, rhinovirus is the most common cause.   Symptoms of the common cold vary from person to person, with common symptoms including sore throat, cough, and malaise.  A low-grade fever of 100.4 may present, but is often uncommon.  Symptoms vary however, and are closely related to a person's age or underlying illnesses.  The most common symptoms associated with the common cold are nasal discharge or congestion, cough, sneezing, headache and pressure in the ears and face.  Cold symptoms usually persist for about 3 to 10 days, but can last up to 2 weeks.  It is important to know that colds do not cause serious illness or complications in most cases.    The common cold is transmitted from person to person, with the most common method of transmission being a person's hands.  The virus is able to live on the skin and can infect other persons for up to 2 hours after direct contact.  Also, colds are transmitted when someone coughs or sneezes; thus, it is important to cover the mouth to reduce this risk.  To keep the spread of the common cold at [place], good hand hygiene is very important.  This is an infection that is most likely caused by a virus. There are no specific treatments for the common cold other than to help you with the symptoms until the infection runs its course.    For nasal congestion, you may use an oral decongestants such as Mucinex D or if you have glaucoma or high blood pressure use plain Mucinex.  Saline nasal spray or nasal drops can help and can safely be used as often as needed for congestion.    If you do not have a history of heart disease, hypertension, diabetes or thyroid disease, prostate/bladder issues or glaucoma, you may also use Sudafed to treat  nasal congestion.  It is highly recommended that you consult with a pharmacist or your primary care physician to ensure this medication is safe for you to take.     If you have a cough, you may use cough suppressants such as Delsym and Robitussin.  If you have glaucoma or high blood pressure, you can also use Coricidin HBP.   For cough I have prescribed for you A prescription cough medication called Tessalon Perles 100 mg. You may take 1-2 capsules every 8 hours as needed for cough  If you have a sore or scratchy throat, use a saltwater gargle-  to  teaspoon of salt dissolved in a 4-ounce to 8-ounce glass of warm water.  Gargle the solution for approximately 15-30 seconds and then spit.  It is important not to swallow the solution.  You can also use throat lozenges/cough drops and Chloraseptic spray to help with throat pain or discomfort.  Warm or cold liquids can also be helpful in relieving throat pain.  For headache, pain or general discomfort, you can use Ibuprofen or Tylenol as directed.   Some authorities believe that zinc sprays or the use of Echinacea may shorten the course of your symptoms.   HOME CARE . Only take medications as instructed by your medical team. . Be sure to drink plenty of fluids. Water is fine as well as  juices, sodas and electrolyte beverages. You may want to stay away from caffeine or alcohol. If you are nauseated, try taking small sips of liquids. How do you know if you are getting enough fluid? Your urine should be a pale yellow or almost colorless. . Get rest. . Taking a steamy shower or using a humidifier may help nasal congestion and ease sore throat pain. You can place a towel over your head and breathe in the steam from hot water coming from a faucet. . Using a saline nasal spray works much the same way. . Cough drops, hard candies and sore throat lozenges may ease your cough. . Avoid close contacts especially the very young and the elderly . Cover your  mouth if you cough or sneeze . Always remember to wash your hands.   GET HELP RIGHT AWAY IF: . You develop worsening fever. . If your symptoms do not improve within 10 days . You become short of breath. . You develop yellow or green discharge from your nose over 3 days. . You have coughing fits . You develop a severe head ache or visual changes. . You develop shortness of breath or difficulty breathing. . Your symptoms persist after you have completed your treatment plan  MAKE SURE YOU   Understand these instructions.  Will watch your condition.  Will get help right away if you are not doing well or get worse.  Your e-visit answers were reviewed by a board certified advanced clinical practitioner to complete your personal care plan. Depending upon the condition, your plan could have included both over the counter or prescription medications. Please review your pharmacy choice. If there is a problem, you may call our nursing hot line at and have the prescription routed to another pharmacy. Your safety is important to us. If you have drug allergies check your prescription carefully.   You can use MyChart to ask questions about today's visit, request a non-urgent call back, or ask for a work or school excuse for 24 hours related to this e-Visit. If it has been greater than 24 hours you will need to follow up with your provider, or enter a new e-Visit to address those concerns. You will get an e-mail in the next two days asking about your experience.  I hope that your e-visit has been valuable and will speed your recovery. Thank you for using e-visits.      

## 2017-02-24 ENCOUNTER — Other Ambulatory Visit: Payer: Self-pay | Admitting: Internal Medicine

## 2017-02-27 ENCOUNTER — Other Ambulatory Visit: Payer: Self-pay | Admitting: Internal Medicine

## 2017-02-27 ENCOUNTER — Telehealth: Payer: Self-pay | Admitting: Internal Medicine

## 2017-02-27 MED ORDER — VARENICLINE TARTRATE 1 MG PO TABS: 1.0000 mg | ORAL_TABLET | Freq: Two times a day (BID) | ORAL | 2 refills | Status: DC

## 2017-02-27 NOTE — Telephone Encounter (Signed)
Refilling the maintenance month,  Not the starting pack

## 2017-02-27 NOTE — Telephone Encounter (Signed)
Refilled: 01/27/2017 Last OV: 01/27/2017 Next OV: 07/28/2017

## 2017-02-27 NOTE — Telephone Encounter (Signed)
Called [facility] in order to retrieve Varenicline(Chantix Continuing Month Pak) 1mg  from [facility] in [place] [facility] states that they will retrieve medication.

## 2017-02-27 NOTE — Telephone Encounter (Signed)
Copied from [place]. Topic: Quick Communication - See Telephone Encounter >> Feb 27, 2017  1:32 PM Conception Chancy, NT wrote: CRM for notification. See Telephone encounter for:  02/27/17.  Patient is calling and states his RX varenicline (CHANTIX CONTINUING MONTH PAK) 1 MG tablet  was sent to the wrong pharmacy. It needs to be transferred to [place], [place]

## 2017-04-28 ENCOUNTER — Other Ambulatory Visit: Payer: Self-pay | Admitting: Internal Medicine

## 2017-05-23 IMAGING — CT CT CHEST W/ CM
2 of 5 series · 12 of 36 positions shown, 15 images · IV contrast (iopamidol)
Comparison: Chest radiograph 11/30/2015; chest CT 08/31/2015.

CLINICAL DATA: Patient with worsening dizziness, lightheadedness
and shortness of breath over the past 07-[AGE]. Weakness and
fatigue. Weight loss.

EXAM:
CT CHEST, ABDOMEN, AND PELVIS WITH CONTRAST
TECHNIQUE: Multidetector CT imaging of the chest, abdomen and pelvis was
performed following the standard protocol during bolus
administration of intravenous contrast.
CONTRAST:  100mL [id number]-ISS IOPAMIDOL (7C57QA-ISS) INJECTION 61%

[Series 2: cap with · axial · 0.86mm/px · z∈[-1034,-469]mm · 9 of 143 slices shown, 12 images]
[im 15/143  mediastinal]
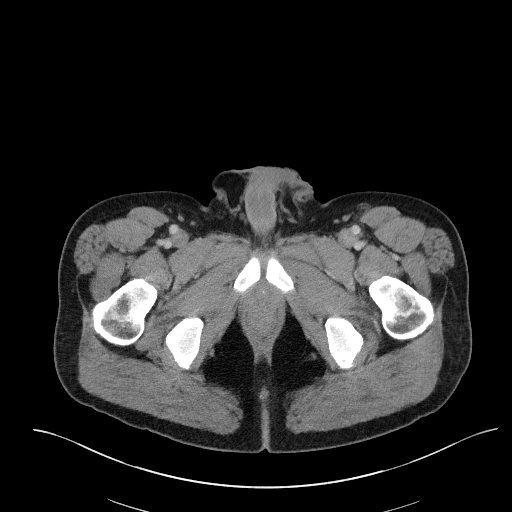
[im 15/143  lung]
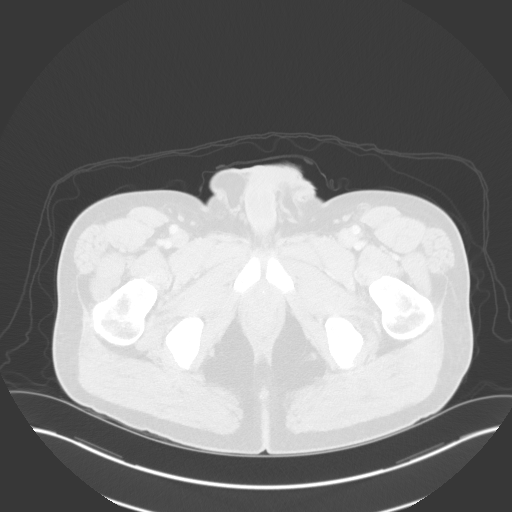
[im 29/143  lung]
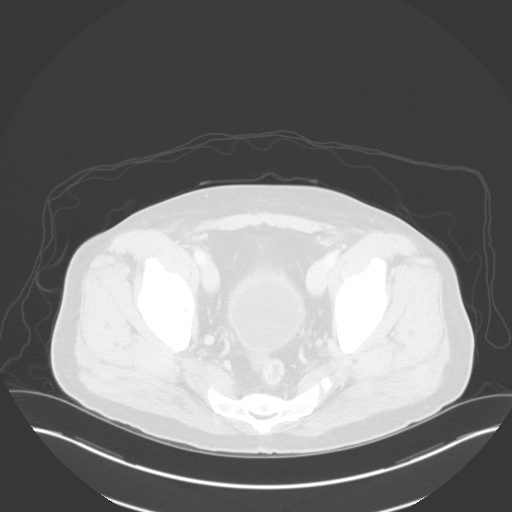
[im 43/143  lung]
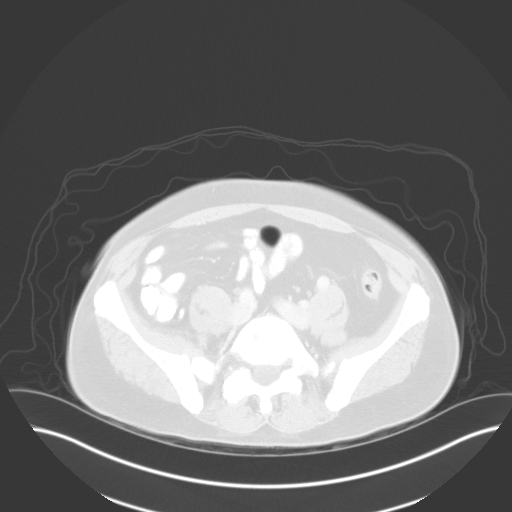
[im 57/143  lung]
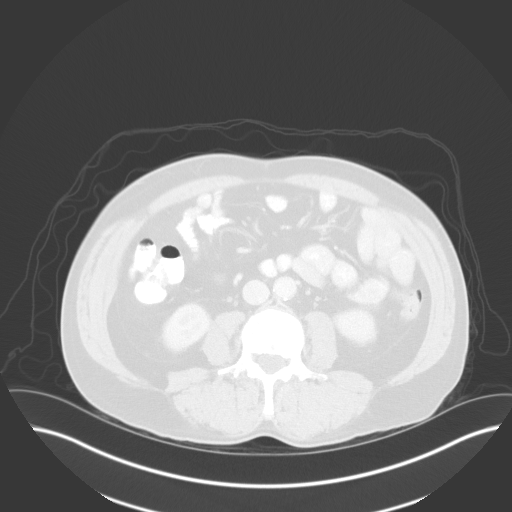
[im 72/143  mediastinal]
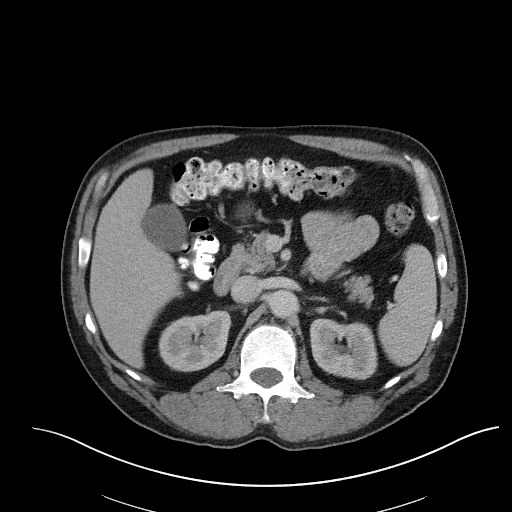
[im 72/143  lung]
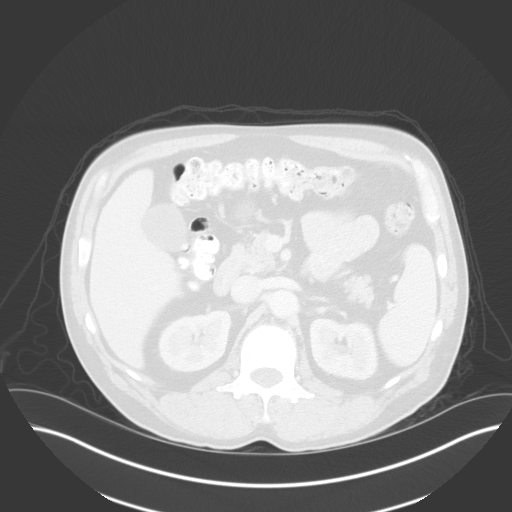
[im 86/143  lung]
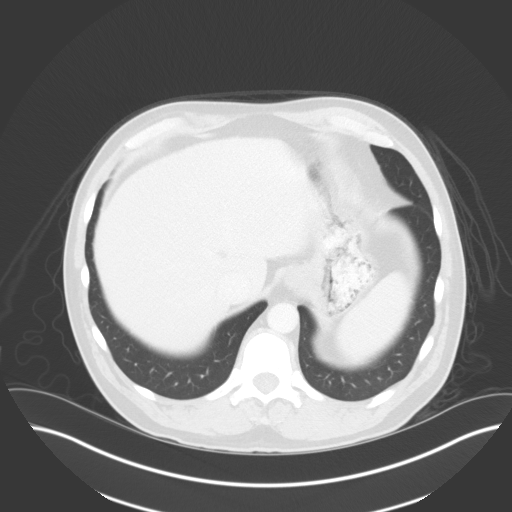
[im 100/143  lung]
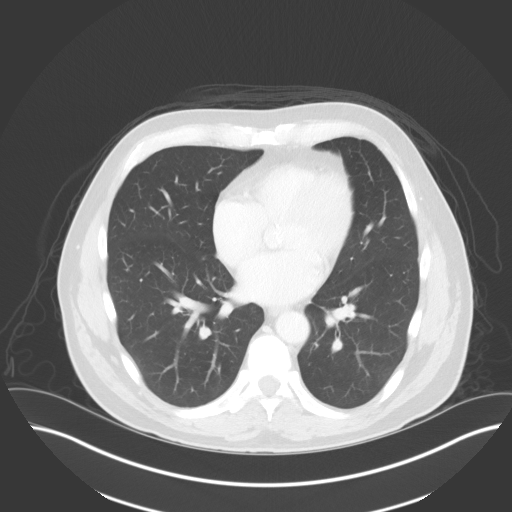
[im 114/143  lung]
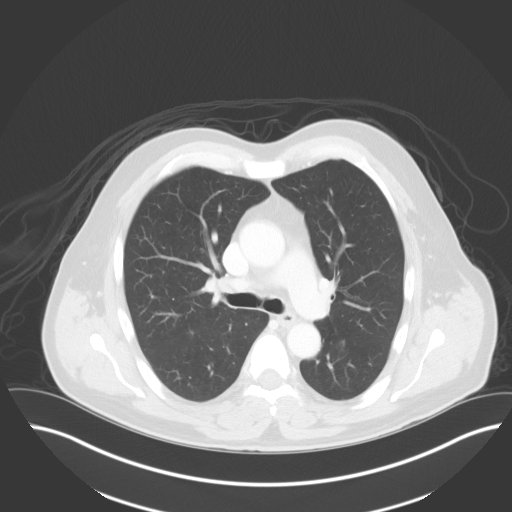
[im 128/143  mediastinal]
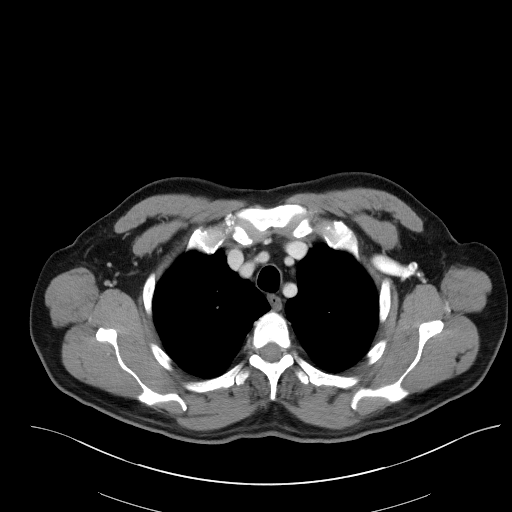
[im 128/143  lung]
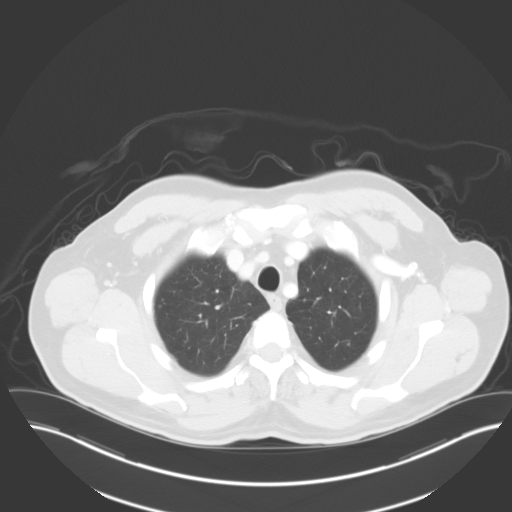

[Series 5: coronals · coronal · 0.85mm/px · 3 of 150 slices shown]
[im 30/150  lung]
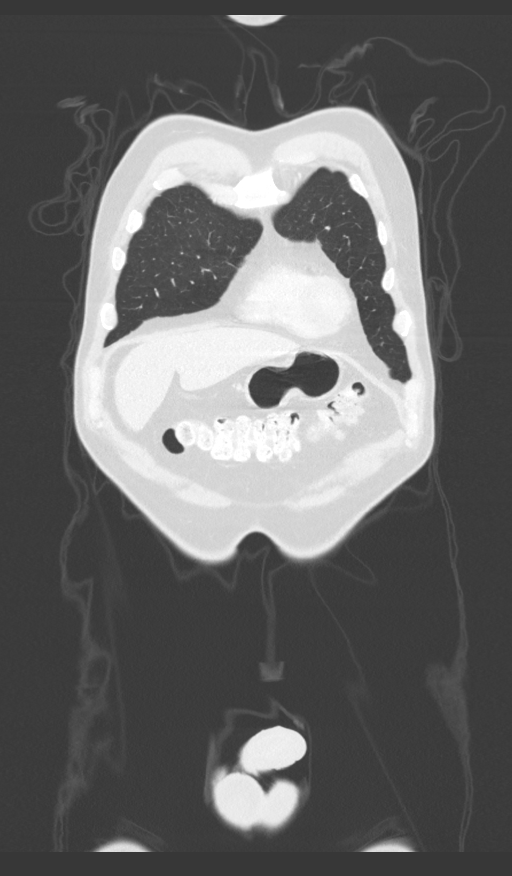
[im 60/150  lung]
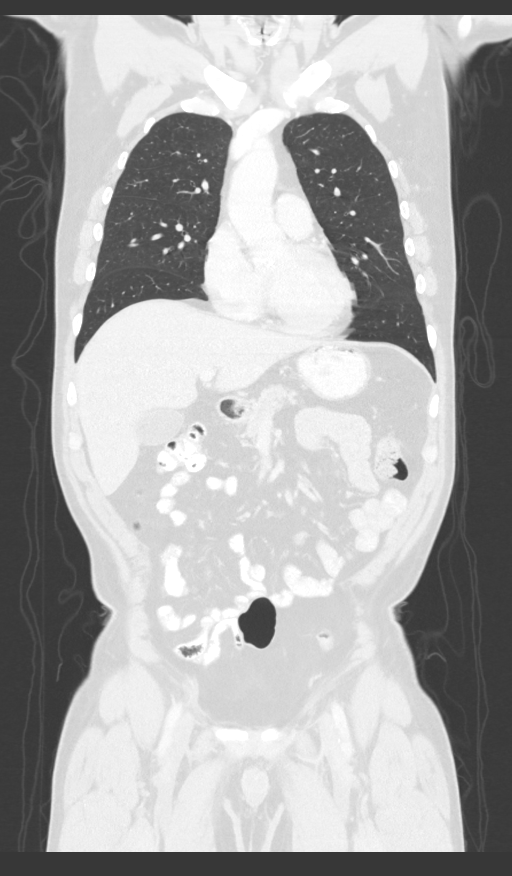
[im 90/150  lung]
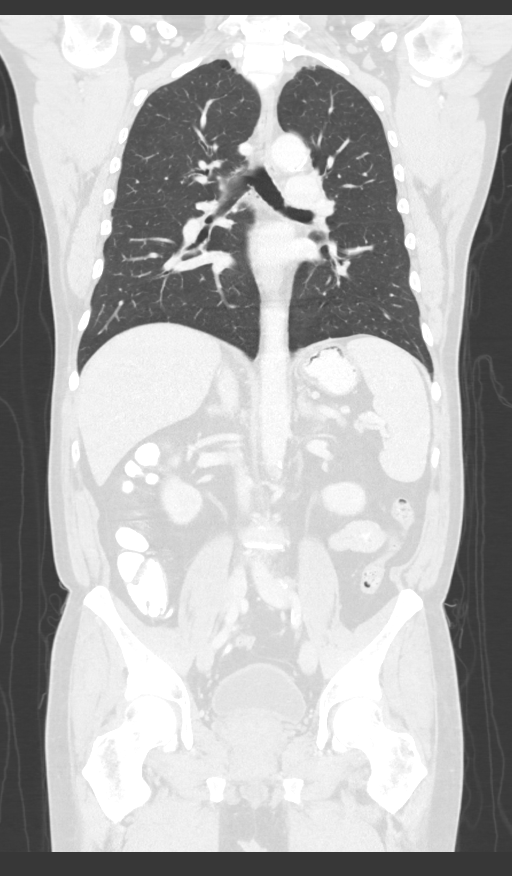

[12 of 36 positions shown; findings below may reference images not displayed]

FINDINGS: CT CHEST FINDINGS

Cardiovascular: Normal heart size. Trace fluid within the superior
pericardial recess. Peripheral calcified atherosclerotic plaque
involving the thoracic aorta.

Mediastinum/Nodes: No enlarged axillary, mediastinal or hilar
lymphadenopathy. Esophagus is unremarkable.

Lungs/Pleura: Central airways are patent. No consolidative or
nodular pulmonary opacities. No pleural effusion or pneumothorax. No
discrete pulmonary nodules are identified.

Musculoskeletal: Thoracic spine degenerative changes. No aggressive
or acute appearing osseous lesions.

CT ABDOMEN PELVIS FINDINGS

Hepatobiliary: Stable sub cm low-attenuation lesion near the caudate
lobe, too small to characterize however likely representing a small
cyst (image 59; series 2). Gallbladder is unremarkable.

Pancreas: Unremarkable

Spleen: Unremarkable

Adrenals/Urinary Tract: The adrenal glands are normal. Kidneys
enhance symmetrically with contrast. No hydronephrosis. Urinary
bladder is unremarkable.

Stomach/Bowel: Trace free fluid in the pelvis. No abnormal bowel
wall thickening or evidence for bowel obstruction. Normal appendix.
No free intraperitoneal air. Normal morphology of the stomach.

Vascular/Lymphatic: Normal caliber abdominal aorta. Peripheral
calcified atherosclerotic plaque. No retroperitoneal
lymphadenopathy.

Reproductive: Prostate unremarkable.

Other: Small bilateral fat containing inguinal hernias.

Musculoskeletal: Lumbar spine degenerative changes. No aggressive or
acute appearing osseous lesions.
IMPRESSION: No acute process within the chest, abdomen or pelvis.

Aortic atherosclerosis.

Small bilateral fat containing inguinal hernias.

## 2017-05-27 ENCOUNTER — Other Ambulatory Visit: Payer: Self-pay | Admitting: Internal Medicine

## 2017-05-30 NOTE — Telephone Encounter (Signed)
Refilled: 02/27/2017 Last OV: 01/27/2017 Next OV: 07/28/2017

## 2017-07-12 ENCOUNTER — Other Ambulatory Visit: Payer: Self-pay | Admitting: Internal Medicine

## 2017-07-12 DIAGNOSIS — E538 Deficiency of other specified B group vitamins: Secondary | ICD-10-CM

## 2017-07-12 DIAGNOSIS — D594 Other nonautoimmune hemolytic anemias: Secondary | ICD-10-CM

## 2017-07-28 ENCOUNTER — Ambulatory Visit (INDEPENDENT_AMBULATORY_CARE_PROVIDER_SITE_OTHER): Payer: BLUE CROSS/BLUE SHIELD | Admitting: Internal Medicine

## 2017-07-28 ENCOUNTER — Encounter: Payer: Self-pay | Admitting: Internal Medicine

## 2017-07-28 VITALS — BP 120/70 | HR 72 | Temp 98.2°F | Resp 15 | Ht 69.0 in | Wt 204.4 lb

## 2017-07-28 DIAGNOSIS — Z79899 Other long term (current) drug therapy: Secondary | ICD-10-CM

## 2017-07-28 DIAGNOSIS — E538 Deficiency of other specified B group vitamins: Secondary | ICD-10-CM

## 2017-07-28 DIAGNOSIS — Z0000 Encounter for general adult medical examination without abnormal findings: Secondary | ICD-10-CM

## 2017-07-28 DIAGNOSIS — Z1159 Encounter for screening for other viral diseases: Secondary | ICD-10-CM

## 2017-07-28 DIAGNOSIS — Z125 Encounter for screening for malignant neoplasm of prostate: Secondary | ICD-10-CM | POA: Diagnosis not present

## 2017-07-28 DIAGNOSIS — E7800 Pure hypercholesterolemia, unspecified: Secondary | ICD-10-CM

## 2017-07-28 DIAGNOSIS — D594 Other nonautoimmune hemolytic anemias: Secondary | ICD-10-CM

## 2017-07-28 DIAGNOSIS — Z87891 Personal history of nicotine dependence: Secondary | ICD-10-CM

## 2017-07-28 DIAGNOSIS — E041 Nontoxic single thyroid nodule: Secondary | ICD-10-CM

## 2017-07-28 DIAGNOSIS — R972 Elevated prostate specific antigen [PSA]: Secondary | ICD-10-CM | POA: Diagnosis not present

## 2017-07-28 MED ORDER — VARENICLINE TARTRATE 1 MG PO TABS
1.0000 mg | ORAL_TABLET | Freq: Two times a day (BID) | ORAL | 2 refills | Status: DC
Start: 2017-07-28 — End: 2017-11-11

## 2017-07-28 MED ORDER — ZOSTER VAC RECOMB ADJUVANTED 50 MCG/0.5ML IM SUSR: 0.5000 mL | Freq: Once | INTRAMUSCULAR | 1 refills | Status: AC

## 2017-07-28 MED ORDER — ALPRAZOLAM 0.25 MG PO TABS: 0.2500 mg | ORAL_TABLET | Freq: Every evening | ORAL | 5 refills | Status: DC | PRN

## 2017-07-28 NOTE — Progress Notes (Signed)
Patient ID: Alexander Little., male    DOB: 1960/01/16  Age: 57 y.o. MRN: [id number]  The patient is here for annual preventive examination and management of other chronic and acute problems.  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  The roster of all physicians providing medical care to patient - is listed in the Snapshot section of the chart.  Activities of daily living:  The patient is 100% independent in all ADLs: dressing, toileting, feeding as well as independent mobility  Home safety : The patient has smoke detectors in the home. They wear seatbelts.  There are no firearms at home. There is no violence in the home.   There is no risks for hepatitis, STDs or HIV. There is no   history of blood transfusion. They have no travel history to infectious disease endemic areas of the world.  The patient has seen their dentist in the last six month. They have seen their eye doctor in the last year. They admit to slight hearing difficulty with regard to whispered voices and some television programs.  They have deferred audiologic testing in the last year.  They do not  have excessive sun exposure. Discussed the need for sun protection: hats, long sleeves and use of sunscreen if there is significant sun exposure.   Diet: the importance of a healthy diet is discussed. They do have a healthy diet.  The benefits of regular aerobic exercise were discussed. She walks 4 times per week ,  20 minutes.   Depression screen: there are no signs or vegative symptoms of depression- irritability, change in appetite, anhedonia, sadness/tearfullness.  Cognitive assessment: the patient manages all their financial and personal affairs and is actively engaged.  They could relate day,date,year and events; recalled 2/3 objects at 3 minutes; performed clock-face test normally.  The following portions of the patient's history were reviewed and updated as appropriate: allergies, current medications, past family history, past medical history,  past surgical history, past social history  and problem list.  Visual acuity was not assessed per patient preference since she has regular follow up with her ophthalmologist. Hearing and body mass index were assessed and reviewed.   During the course of the visit the patient was educated and counseled about appropriate screening and preventive services including : fall prevention , diabetes screening, nutrition counseling, colorectal cancer screening, and recommended immunizations.    CC: The primary encounter diagnosis was Encounter for preventive health examination. 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  History Jabri has a past medical history of Anemia, BPH (benign prostatic hyperplasia), Chest pain, Dental bridge present, Heart murmur, and Hyperlipidemia.   He has a past surgical history that includes Prostate biopsy (2012) and Cardiac catheterization (2012).   His family history includes COPD in his father and mother; Cancer (age of onset: 74) in his father; Coronary artery disease (age of onset: 32) in his brother; Early death in  his father; Heart disease in his mother; Mental illness in his mother; Stroke in his mother.He reports that he has been smoking cigarettes.  He has been smoking about 1.50 packs per day. He has never used smokeless tobacco. He reports that he drinks alcohol. He reports that he does not use drugs.  Outpatient  Medications Prior to Visit  Medication Sig Dispense Refill  . ASPIRIN 81 PO Take by mouth daily.    . B-D 3CC LUER-LOK SYR 25GX5/8" 25G X 5/8" 3 ML MISC USE AS DIRECTED 6 each 0  . cetirizine (ZYRTEC) 10 MG tablet Take 10 mg by mouth daily.    . Coenzyme Q10 (COQ10) 100 MG CAPS Take 100 mg by mouth daily.     . cyanocobalamin (,VITAMIN B-12,) 1000 MCG/ML injection Inject 1 mL (1,000 mcg total) into the muscle every 14 (fourteen) days. 10 mL 6  . montelukast (SINGULAIR) 10 MG tablet TAKE ONE TABLET BY MOUTH AT BEDTIME 90 tablet 1  . NEEDLE, DISP, 25 G 25G X 1" MISC Inject 1 Dose into the muscle every 30 (thirty) days. 12 each 0  . simvastatin (ZOCOR) 40 MG tablet TAKE 1 TABLET DAILY AT 6PM 90 tablet 1  . SYRINGE DISPOSABLE 3CC 3 ML MISC Inject 1 Dose into the muscle every 30 (thirty) days. 12 each 0  . tamsulosin (FLOMAX) 0.4 MG CAPS capsule TAKE 1 CAPSULE BY MOUTH ONCE DAILY 90 capsule 1  . ALPRAZolam (XANAX) 0.25 MG tablet Take 1 tablet (0.25 mg total) by mouth at bedtime as needed for anxiety. 30 tablet 2  . CHANTIX 1 MG tablet TAKE ONE TABLET TWICE DAILY 60 tablet 2  . benzonatate (TESSALON) 100 MG capsule Take 1 capsule (100 mg total) by mouth 2 (two) times daily as needed for cough. (Patient not taking: Reported on 07/28/2017) 20 capsule 0  . cetirizine-pseudoephedrine (ZYRTEC-D) 5-120 MG tablet Take 1 tablet by mouth 2 (two) times daily. (Patient not taking: Reported on 07/28/2017) 30 tablet 0  . fluticasone (FLONASE) 50 MCG/ACT nasal spray PLACE 2 SPRAYS INTO BOTH NOSTRILS DAILY. (Patient not taking: Reported on 07/28/2017) 48 g 1  . varenicline (CHANTIX STARTING MONTH PAK) 0.5 MG X 11 & 1 MG X 42 tablet Take one 0.5 mg tablet by mouth once daily for 3 days, then increase to one 0.5 mg tablet twice daily for 4 days, then increase to one 1 mg tablet twice daily. (Patient not taking: Reported on 07/28/2017) 53 tablet 0   No facility-administered medications prior to visit.     Review of  Systems   Patient denies headache, fevers, malaise, unintentional weight loss, skin rash, eye pain, sinus congestion and sinus pain, sore throat, dysphagia,  hemoptysis , cough, dyspnea, wheezing, chest pain, palpitations, orthopnea, edema, abdominal pain, nausea, melena, diarrhea, constipation, flank pain, dysuria, hematuria, urinary  Frequency, nocturia, numbness, tingling, seizures,  Focal weakness, Loss of consciousness,  Tremor, insomnia, depression, anxiety, and suicidal ideation.      Objective:  BP 120/70 (BP Location: Left Arm, Patient Position: Sitting, Cuff Size: Normal)   Pulse 72   Temp 98.2 F (36.8 C) (Oral)   Resp 15   Ht 5\' 9"  (1.753 m)   Wt 204 lb 6.4 oz (92.7 kg)   SpO2 96%   BMI 30.18 kg/m   Physical Exam   General appearance: alert, cooperative and appears stated age Ears: normal TM's and external ear canals both ears Throat: lips, mucosa, and tongue normal; teeth and gums normal Neck: no adenopathy,  bialteral carotid bruits  L > R, supple, symmetrical, trachea midline and thyroid not enlarged, symmetric, no tenderness/mass/nodules Back: symmetric, no curvature. ROM normal. No CVA tenderness. Lungs: clear to auscultation bilaterally Heart: regular rate and rhythm, S1, S2 normal, no murmur, click, rub or gallop Abdomen: soft, non-tender; bowel sounds normal; no masses,  no organomegaly Pulses: 2+ and symmetric Skin: Skin color, texture, turgor normal. No rashes or lesions Lymph nodes: Cervical, supraclavicular, and axillary nodes normal.    Assessment & Plan:   Problem List Items Addressed This Visit    Thyroid nodule    Normal thyroid biopsy march 2015 at [facility], but repeat  biopsy was recommended in 6 months but deferred..  Thyroid function is normal.    Lab Results  Component Value Date   TSH 4.35 07/28/2017         Relevant Orders   TSH (Completed)   Hyperlipidemia    Controlled with simvastatin 40 mg daily for elevated 10 yr risk of  CAD based  on [place] calculation.  Triglycerides have improved with weight loss. .  Lab Results  Component Value Date   CHOL 163 07/28/2017   HDL 39 (L) 07/28/2017   LDLCALC 83 07/28/2017   LDLDIRECT 98.0 06/22/2015   TRIG 342 (H) 07/28/2017   CHOLHDL 4.2 07/28/2017   Lab Results  Component Value Date   ALT 11 01/27/2017   AST 16 01/27/2017   ALKPHOS 70 07/26/2016   BILITOT 0.4 01/27/2017          Relevant Orders   Lipid panel (Completed)   LDL cholesterol, direct   History of tobacco abuse    Patient hs been able to reduce his cigarette use to 3 cigarettes was encouraged to consider pharmacotherapy.       Hemolytic anemia (HCC)    Suggested during admission for symptomatic anemia,  And treated with steroids, but all appropriate screening tests for conditions commonly causing hemolysis were negative and per oncology the [facility] panel was run incorrectly. His anemia remains resolved with b12 supplementation and platelets have returned to normal as well.   Lab Results  Component Value Date   WBC 7.1 07/28/2017   HGB 14.8 07/28/2017   HCT 42.8 07/28/2017   MCV 87.3 07/28/2017   PLT 166 07/28/2017         Encounter for preventive health examination - Primary    Annual comprehensive preventive exam was done as well as an evaluation and management of chronic conditions .  During the course of the visit the patient was educated and counseled about appropriate screening and preventive services including :  diabetes screening, lipid analysis with projected  10 year  risk for CAD , nutrition counseling, prostate, cervical and colorectal cancer screening, and recommended immunizations.  Printed recommendations for health maintenannce screenings was give      RESOLVED: Elevated PSA, less than 10 ng/ml      Lab Results  Component Value Date   PSA 0.3 07/28/2017   PSA 0.26 07/26/2016   PSA 0.30 06/22/2015         B12 deficiency   Relevant Orders   Vitamin B12 (Completed)   CBC  with Differential/Platelet (Completed)    Other Visit Diagnoses    Long-term use of high-risk medication       Relevant Orders   Comprehensive metabolic panel   Need for hepatitis C screening test       Relevant Orders   Hepatitis C antibody (Completed)  I have discontinued Loralee Pacas. Gassman Jr.'s cetirizine-pseudoephedrine, fluticasone, varenicline, and benzonatate. I have also changed his CHANTIX to varenicline. Additionally, I am having him start on Zoster Vaccine Adjuvanted. Lastly, I am having him maintain his CoQ10, NEEDLE (DISP) 25 G, SYRINGE DISPOSABLE 3CC, ASPIRIN 81 PO, montelukast, cyanocobalamin, simvastatin, tamsulosin, B-D 3CC LUER-LOK SYR 25GX5/8", cetirizine, and ALPRAZolam.  Meds ordered this encounter  Medications  . Zoster Vaccine Adjuvanted [facility]) injection    Sig: Inject 0.5 mLs into the muscle once for 1 dose.    Dispense:  1 each    Refill:  1  . varenicline (CHANTIX) 1 MG tablet    Sig: Take 1 tablet (1 mg total) by mouth 2 (two) times daily.    Dispense:  60 tablet    Refill:  2  . ALPRAZolam (XANAX) 0.25 MG tablet    Sig: Take 1 tablet (0.25 mg total) by mouth at bedtime as needed for anxiety.    Dispense:  30 tablet    Refill:  5    Medications Discontinued During This Encounter  Medication Reason  . benzonatate (TESSALON) 100 MG capsule Completed Course  . cetirizine-pseudoephedrine (ZYRTEC-D) 5-120 MG tablet Patient has not taken in last 30 days  . fluticasone (FLONASE) 50 MCG/ACT nasal spray Patient has not taken in last 30 days  . varenicline (CHANTIX STARTING MONTH PAK) 0.5 MG X 11 & 1 MG X 42 tablet Patient has not taken in last 30 days  . CHANTIX 1 MG tablet Reorder  . ALPRAZolam (XANAX) 0.25 MG tablet Reorder    Follow-up: No follow-ups on file.   [name], MD

## 2017-07-28 NOTE — Patient Instructions (Signed)
I heard a carotid bruit today on your exam..  This should be investigated further with a carotid doppler at some point in the future, because it may mean you have carotid stenosis.   Continuing cholesterol lowering medication,  Aspirin, and tobacco cessation are all important ways to prevent progression   The ShingRx vaccine is now available in local pharmacies and is much more protective thant Zostavaxs,  It is therefore ADVISED for all  eligble adults over 50 to prevent shingles    Health Maintenance, Male A healthy lifestyle and preventive care is important for your health and wellness. Ask your health care provider about what schedule of regular examinations is right for you. What should I know about weight and diet? Eat a Healthy Diet  Eat plenty of vegetables, fruits, whole grains, low-fat dairy products, and lean protein.  Do not eat a lot of foods high in solid fats, added sugars, or salt.  Maintain a Healthy Weight Regular exercise can help you achieve or maintain a healthy weight. You should:  Do at least 150 minutes of exercise each week. The exercise should increase your heart rate and make you sweat (moderate-intensity exercise).  Do strength-training exercises at least twice a week.  Watch Your Levels of Cholesterol and Blood Lipids  Have your blood tested for lipids and cholesterol every 5 years starting at 58 years of age. If you are at high risk for heart disease, you should start having your blood tested when you are 59 years old. You may need to have your cholesterol levels checked more often if: ? Your lipid or cholesterol levels are high. ? You are older than 58 years of age. ? You are at high risk for heart disease.  What should I know about cancer screening? Many types of cancers can be detected early and may often be prevented. Lung Cancer  You should be screened every year for lung cancer if: ? You are a current smoker who has smoked for at least 30  years. ? You are a former smoker who has quit within the past 15 years.  Talk to your health care provider about your screening options, when you should start screening, and how often you should be screened.  Colorectal Cancer  Routine colorectal cancer screening usually begins at 58 years of age and should be repeated every 5-10 years until you are 58 years old. You may need to be screened more often if early forms of precancerous polyps or small growths are found. Your health care provider may recommend screening at an earlier age if you have risk factors for colon cancer.  Your health care provider may recommend using home test kits to check for hidden blood in the stool.  A small camera at the end of a tube can be used to examine your colon (sigmoidoscopy or colonoscopy). This checks for the earliest forms of colorectal cancer.  Prostate and Testicular Cancer  Depending on your age and overall health, your health care provider may do certain tests to screen for prostate and testicular cancer.  Talk to your health care provider about any symptoms or concerns you have about testicular or prostate cancer.  Skin Cancer  Check your skin from head to toe regularly.  Tell your health care provider about any new moles or changes in moles, especially if: ? There is a change in a mole's size, shape, or color. ? You have a mole that is larger than a pencil eraser.  Always use sunscreen.  Apply sunscreen liberally and repeat throughout the day.  Protect yourself by wearing long sleeves, pants, a wide-brimmed hat, and sunglasses when outside.  What should I know about heart disease, diabetes, and high blood pressure?  If you are 29-83 years of age, have your blood pressure checked every 3-5 years. If you are 72 years of age or older, have your blood pressure checked every year. You should have your blood pressure measured twice-once when you are at a hospital or clinic, and once when you are  not at a hospital or clinic. Record the average of the two measurements. To check your blood pressure when you are not at a hospital or clinic, you can use: ? An automated blood pressure machine at a pharmacy. ? A home blood pressure monitor.  Talk to your health care provider about your target blood pressure.  If you are between 2-47 years old, ask your health care provider if you should take aspirin to prevent heart disease.  Have regular diabetes screenings by checking your fasting blood sugar level. ? If you are at a normal weight and have a low risk for diabetes, have this test once every three years after the age of 15. ? If you are overweight and have a high risk for diabetes, consider being tested at a younger age or more often.  A one-time screening for abdominal aortic aneurysm (AAA) by ultrasound is recommended for men aged 62-75 years who are current or former smokers. What should I know about preventing infection? Hepatitis B If you have a higher risk for hepatitis B, you should be screened for this virus. Talk with your health care provider to find out if you are at risk for hepatitis B infection. Hepatitis C Blood testing is recommended for:  Everyone born from [birth year] through 1966.  Anyone with known risk factors for hepatitis C.  Sexually Transmitted Diseases (STDs)  You should be screened each year for STDs including gonorrhea and chlamydia if: ? You are sexually active and are younger than 58 years of age. ? You are older than 58 years of age and your health care provider tells you that you are at risk for this type of infection. ? Your sexual activity has changed since you were last screened and you are at an increased risk for chlamydia or gonorrhea. Ask your health care provider if you are at risk.  Talk with your health care provider about whether you are at high risk of being infected with HIV. Your health care provider may recommend a prescription medicine to help  prevent HIV infection.  What else can I do?  Schedule regular health, dental, and eye exams.  Stay current with your vaccines (immunizations).  Do not use any tobacco products, such as cigarettes, chewing tobacco, and e-cigarettes. If you need help quitting, ask your health care provider.  Limit alcohol intake to no more than 2 drinks per day. One drink equals 12 ounces of beer, 5 ounces of wine, or 1 ounces of hard liquor.  Do not use street drugs.  Do not share needles.  Ask your health care provider for help if you need support or information about quitting drugs.  Tell your health care provider if you often feel depressed.  Tell your health care provider if you have ever been abused or do not feel safe at home. This information is not intended to replace advice given to you by your health care provider. Make sure you discuss any questions you have  with your health care provider. Document Released: 06/18/2007 Document Revised: 08/19/2015 Document Reviewed: 09/23/2014 Elsevier Interactive Patient Education  [employer].

## 2017-07-29 ENCOUNTER — Other Ambulatory Visit: Payer: Self-pay | Admitting: Internal Medicine

## 2017-07-29 DIAGNOSIS — J301 Allergic rhinitis due to pollen: Secondary | ICD-10-CM

## 2017-07-29 LAB — HEPATITIS C ANTIBODY
HEP C AB: NONREACTIVE
SIGNAL TO CUT-OFF: 0.02 (ref <1.00)

## 2017-07-30 LAB — CBC WITH DIFFERENTIAL/PLATELET
Basophils Absolute: 7 cells/uL (ref 0–200)
Basophils Relative: 0.1 %
Eosinophils Absolute: 156 cells/uL (ref 15–500)
Eosinophils Relative: 2.2 %
HCT: 42.8 % (ref 38.5–50.0)
Hemoglobin: 14.8 g/dL (ref 13.2–17.1)
LYMPHS ABS: 2513 {cells}/uL (ref 850–3900)
MCH: 30.2 pg (ref 27.0–33.0)
MCHC: 34.6 g/dL (ref 32.0–36.0)
MCV: 87.3 fL (ref 80.0–100.0)
MPV: 9.8 fL (ref 7.5–12.5)
Monocytes Relative: 8.3 %
Neutro Abs: 3834 cells/uL (ref 1500–7800)
Neutrophils Relative %: 54 %
Platelets: 166 10*3/uL (ref 140–400)
RBC: 4.9 10*6/uL (ref 4.20–5.80)
RDW: 12.5 % (ref 11.0–15.0)
Total Lymphocyte: 35.4 %
WBC mixed population: 589 cells/uL (ref 200–950)
WBC: 7.1 10*3/uL (ref 3.8–10.8)

## 2017-07-30 LAB — LIPID PANEL
Cholesterol: 163 mg/dL (ref <200)
HDL: 39 mg/dL — ABNORMAL LOW (ref >40)
LDL Cholesterol (Calc): 83 mg/dL (calc)
NON-HDL CHOLESTEROL (CALC): 124 mg/dL (ref <130)
Total CHOL/HDL Ratio: 4.2 (calc) (ref <5.0)
Triglycerides: 342 mg/dL — ABNORMAL HIGH (ref <150)

## 2017-07-30 LAB — VITAMIN B12: Vitamin B-12: 1135 pg/mL — ABNORMAL HIGH (ref 200–1100)

## 2017-07-30 LAB — PSA: PSA: 0.3 ng/mL (ref <=4.0)

## 2017-07-30 LAB — LDL CHOLESTEROL, DIRECT: LDL DIRECT: 91 mg/dL (ref <100)

## 2017-07-30 LAB — TSH: TSH: 4.35 mIU/L (ref 0.40–4.50)

## 2017-07-30 NOTE — Assessment & Plan Note (Signed)
Normal thyroid biopsy march 2015 at [facility], but repeat  biopsy was recommended in 6 months but deferred..  Thyroid function is normal.    Lab Results  Component Value Date   TSH 4.35 07/28/2017

## 2017-07-30 NOTE — Assessment & Plan Note (Signed)
Patient hs been able to reduce his cigarette use to 3 cigarettes was encouraged to consider pharmacotherapy.

## 2017-07-30 NOTE — Assessment & Plan Note (Addendum)
Annual comprehensive preventive exam was done as well as an evaluation and management of chronic conditions .  During the course of the visit the patient was educated and counseled about appropriate screening and preventive services including :  diabetes screening, lipid analysis with projected  10 year  risk for CAD , nutrition counseling, prostate, cervical and colorectal cancer screening, and recommended immunizations.  Printed recommendations for health maintenannce screenings was give

## 2017-07-30 NOTE — Assessment & Plan Note (Signed)
   Lab Results  Component Value Date   PSA 0.3 07/28/2017   PSA 0.26 07/26/2016   PSA 0.30 06/22/2015

## 2017-07-30 NOTE — Assessment & Plan Note (Addendum)
Controlled with simvastatin 40 mg daily for elevated 10 yr risk of  CAD based on Framingham calculation.  Triglycerides have improved with weight loss. .  Lab Results  Component Value Date   CHOL 163 07/28/2017   HDL 39 (L) 07/28/2017   LDLCALC 83 07/28/2017   LDLDIRECT 98.0 06/22/2015   TRIG 342 (H) 07/28/2017   CHOLHDL 4.2 07/28/2017   Lab Results  Component Value Date   ALT 11 01/27/2017   AST 16 01/27/2017   ALKPHOS 70 07/26/2016   BILITOT 0.4 01/27/2017

## 2017-07-30 NOTE — Assessment & Plan Note (Signed)
Suggested during admission for symptomatic anemia,  And treated with steroids, but all appropriate screening tests for conditions commonly causing hemolysis were negative and per oncology the [facility] panel was run incorrectly. His anemia remains resolved with b12 supplementation and platelets have returned to normal as well.   Lab Results  Component Value Date   WBC 7.1 07/28/2017   HGB 14.8 07/28/2017   HCT 42.8 07/28/2017   MCV 87.3 07/28/2017   PLT 166 07/28/2017

## 2017-10-10 DIAGNOSIS — D485 Neoplasm of uncertain behavior of skin: Secondary | ICD-10-CM | POA: Diagnosis not present

## 2017-10-10 DIAGNOSIS — X32XXXA Exposure to sunlight, initial encounter: Secondary | ICD-10-CM | POA: Diagnosis not present

## 2017-10-10 DIAGNOSIS — D044 Carcinoma in situ of skin of scalp and neck: Secondary | ICD-10-CM | POA: Diagnosis not present

## 2017-10-10 DIAGNOSIS — D225 Melanocytic nevi of trunk: Secondary | ICD-10-CM | POA: Diagnosis not present

## 2017-10-10 DIAGNOSIS — L570 Actinic keratosis: Secondary | ICD-10-CM | POA: Diagnosis not present

## 2017-10-10 DIAGNOSIS — C44519 Basal cell carcinoma of skin of other part of trunk: Secondary | ICD-10-CM | POA: Diagnosis not present

## 2017-10-31 ENCOUNTER — Other Ambulatory Visit (INDEPENDENT_AMBULATORY_CARE_PROVIDER_SITE_OTHER): Payer: BLUE CROSS/BLUE SHIELD

## 2017-10-31 DIAGNOSIS — Z79899 Other long term (current) drug therapy: Secondary | ICD-10-CM

## 2017-10-31 DIAGNOSIS — E7800 Pure hypercholesterolemia, unspecified: Secondary | ICD-10-CM | POA: Diagnosis not present

## 2017-10-31 LAB — COMPREHENSIVE METABOLIC PANEL
ALBUMIN: 4.4 g/dL (ref 3.5–5.2)
ALK PHOS: 62 U/L (ref 39–117)
ALT: 22 U/L (ref 0–53)
AST: 20 U/L (ref 0–37)
BUN: 12 mg/dL (ref 6–23)
CO2: 28 mEq/L (ref 19–32)
CREATININE: 0.9 mg/dL (ref 0.40–1.50)
Calcium: 9.1 mg/dL (ref 8.4–10.5)
Chloride: 103 mEq/L (ref 96–112)
GFR: 92.03 mL/min (ref >60.00)
Glucose, Bld: 102 mg/dL — ABNORMAL HIGH (ref 70–99)
POTASSIUM: 4.7 meq/L (ref 3.5–5.1)
SODIUM: 137 meq/L (ref 135–145)
TOTAL PROTEIN: 6.9 g/dL (ref 6.0–8.3)
Total Bilirubin: 0.5 mg/dL (ref 0.2–1.2)

## 2017-10-31 LAB — LIPID PANEL
Cholesterol: 153 mg/dL (ref 0–200)
HDL: 38.3 mg/dL — AB (ref >39.00)
NonHDL: 114.91
Total CHOL/HDL Ratio: 4
Triglycerides: 258 mg/dL — ABNORMAL HIGH (ref 0.0–149.0)
VLDL: 51.6 mg/dL — ABNORMAL HIGH (ref 0.0–40.0)

## 2017-10-31 LAB — LDL CHOLESTEROL, DIRECT: LDL DIRECT: 93 mg/dL

## 2017-11-03 MED ORDER — SIMVASTATIN 40 MG PO TABS: ORAL_TABLET | ORAL | 1 refills | Status: DC

## 2017-11-03 NOTE — Addendum Note (Signed)
Addended by: [name] on: 11/03/2017 01:28 PM   Modules accepted: Orders

## 2017-11-08 DIAGNOSIS — C44519 Basal cell carcinoma of skin of other part of trunk: Secondary | ICD-10-CM | POA: Diagnosis not present

## 2017-11-08 DIAGNOSIS — D049 Carcinoma in situ of skin, unspecified: Secondary | ICD-10-CM | POA: Diagnosis not present

## 2017-11-11 ENCOUNTER — Other Ambulatory Visit: Payer: Self-pay | Admitting: Internal Medicine

## 2017-11-13 ENCOUNTER — Other Ambulatory Visit: Payer: Self-pay

## 2018-02-08 ENCOUNTER — Other Ambulatory Visit: Payer: Self-pay | Admitting: Internal Medicine

## 2018-02-25 ENCOUNTER — Emergency Department
Admission: EM | Admit: 2018-02-25 | Discharge: 2018-02-25 | Disposition: A | Discharge status: Home / Self Care | Payer: BLUE CROSS/BLUE SHIELD | Attending: Emergency Medicine | Admitting: Emergency Medicine

## 2018-02-25 ENCOUNTER — Emergency Department: Payer: BLUE CROSS/BLUE SHIELD

## 2018-02-25 ENCOUNTER — Encounter: Payer: Self-pay | Admitting: Emergency Medicine

## 2018-02-25 ENCOUNTER — Other Ambulatory Visit: Payer: Self-pay

## 2018-02-25 DIAGNOSIS — Z7982 Long term (current) use of aspirin: Secondary | ICD-10-CM | POA: Diagnosis not present

## 2018-02-25 DIAGNOSIS — S68111A Complete traumatic metacarpophalangeal amputation of left index finger, initial encounter: Secondary | ICD-10-CM | POA: Insufficient documentation

## 2018-02-25 DIAGNOSIS — Y929 Unspecified place or not applicable: Secondary | ICD-10-CM | POA: Diagnosis not present

## 2018-02-25 DIAGNOSIS — Z23 Encounter for immunization: Secondary | ICD-10-CM | POA: Diagnosis not present

## 2018-02-25 DIAGNOSIS — Y999 Unspecified external cause status: Secondary | ICD-10-CM | POA: Diagnosis not present

## 2018-02-25 DIAGNOSIS — S62621B Displaced fracture of medial phalanx of left index finger, initial encounter for open fracture: Secondary | ICD-10-CM

## 2018-02-25 DIAGNOSIS — Y939 Activity, unspecified: Secondary | ICD-10-CM | POA: Insufficient documentation

## 2018-02-25 DIAGNOSIS — Z79899 Other long term (current) drug therapy: Secondary | ICD-10-CM | POA: Diagnosis not present

## 2018-02-25 DIAGNOSIS — S68119A Complete traumatic metacarpophalangeal amputation of unspecified finger, initial encounter: Secondary | ICD-10-CM

## 2018-02-25 DIAGNOSIS — F17210 Nicotine dependence, cigarettes, uncomplicated: Secondary | ICD-10-CM | POA: Diagnosis not present

## 2018-02-25 DIAGNOSIS — W312XXA Contact with powered woodworking and forming machines, initial encounter: Secondary | ICD-10-CM | POA: Diagnosis not present

## 2018-02-25 DIAGNOSIS — S6992XA Unspecified injury of left wrist, hand and finger(s), initial encounter: Secondary | ICD-10-CM | POA: Diagnosis not present

## 2018-02-25 DIAGNOSIS — S68628A Partial traumatic transphalangeal amputation of other finger, initial encounter: Secondary | ICD-10-CM | POA: Diagnosis not present

## 2018-02-25 MED ORDER — OXYCODONE-ACETAMINOPHEN 5-325 MG PO TABS: 1.0000 | ORAL_TABLET | ORAL | 0 refills | Status: DC | PRN

## 2018-02-25 MED ORDER — ONDANSETRON HCL 4 MG/2ML IJ SOLN: 4.0000 mg | Freq: Once | INTRAMUSCULAR | Status: DC

## 2018-02-25 MED ORDER — SODIUM CHLORIDE 0.9 % IV SOLN
3.0000 g | Freq: Once | INTRAVENOUS | Status: AC
  Administered 2018-02-25: 3 g via INTRAVENOUS
  Filled 2018-02-25: qty 3

## 2018-02-25 MED ORDER — TETANUS-DIPHTH-ACELL PERTUSSIS 5-2.5-18.5 LF-MCG/0.5 IM SUSP
0.5000 mL | Freq: Once | INTRAMUSCULAR | Status: AC
  Administered 2018-02-25: 0.5 mL via INTRAMUSCULAR
  Filled 2018-02-25: qty 0.5

## 2018-02-25 MED ORDER — LIDOCAINE HCL (PF) 1 % IJ SOLN
5.0000 mL | Freq: Once | INTRAMUSCULAR | Status: AC
  Administered 2018-02-25: 5 mL
  Filled 2018-02-25: qty 5

## 2018-02-25 MED ORDER — CLINDAMYCIN HCL 300 MG PO CAPS: 300.0000 mg | ORAL_CAPSULE | Freq: Four times a day (QID) | ORAL | 0 refills | Status: AC

## 2018-02-25 MED ORDER — BUPIVACAINE HCL 0.5 % IJ SOLN
5.0000 mL | Freq: Once | INTRAMUSCULAR | Status: DC
  Filled 2018-02-25: qty 5

## 2018-02-25 MED ORDER — MORPHINE SULFATE (PF) 4 MG/ML IV SOLN: 4.0000 mg | Freq: Once | INTRAVENOUS | Status: DC

## 2018-02-25 NOTE — ED Notes (Signed)
See triage note  Presents with laceration to tip of left index finger  State she was using a table swa which did not have a guard

## 2018-02-25 NOTE — Discharge Instructions (Addendum)
Please keep dressing clean and dry.  Take antibiotics as prescribed.  Return to the ER for any worsening symptoms or urgent changes in your health.  Call orthopedic office Monday morning to schedule follow-up appoint.

## 2018-02-25 NOTE — ED Provider Notes (Signed)
[facility] EMERGENCY DEPARTMENT Provider Note   CSN: [id number] Arrival date & time: 02/25/18  1006    History   Chief Complaint Chief Complaint  Patient presents with  . Laceration    HPI Alexander Little. is a 58 y.o. male presents to the emergency department for evaluation of tablesaw injury to the left index finger.  Patient states around 9 AM this morning he cutting a piece of wood when the glove was caught into the blade and ended up cutting off the end of his left index finger.  Patient was unable to find the tip of the left index finger.  His pain is mild.  He denies any other injury to his body.  His tetanus is not up-to-date.  He has not had any medications for pain.  He is only on aspirin.  He is right-hand dominant.     HPI  Past Medical History:  Diagnosis Date  . Anemia   . BPH (benign prostatic hyperplasia)   . Chest pain   . Dental bridge present    permanent - upper  . Heart murmur    followed by PCP  . Hyperlipidemia     Patient Active Problem List   Diagnosis Date Noted  . Encounter for preventive health examination 07/28/2016  . Anxiety about health 03/13/2016  . Hospital discharge follow-up 12/18/2015  . B12 deficiency 12/13/2015  . 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 Allergic rhinitis due to pollen 04/14/2015  . 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 Obesity 07/16/2012  . History of tobacco abuse 11/13/2011  . Thyroid nodule 11/13/2011  . Constipation 08/23/2011  . Screening for colon cancer 08/23/2011  . Acquired deviated nasal septum 08/22/2011  . masseter muscle hypertrophy 08/22/2011  . Hyperlipidemia     Past Surgical History:  Procedure Laterality Date  . CARDIAC CATHETERIZATION  2012   normal, [place]  . PROSTATE BIOPSY  2012   normal        Home Medications    Prior to Admission medications   Medication Sig Start Date End Date Taking? Authorizing Provider  ALPRAZolam (XANAX) 0.25 MG tablet  Take 1 tablet (0.25 mg total) by mouth at bedtime as needed for anxiety. 07/28/17   [name], MD  ASPIRIN 81 PO Take by mouth daily.    [provider]  B-D 3CC LUER-LOK SYR 25GX5/8" 25G X 5/8" 3 ML MISC USE AS DIRECTED 07/14/17   [name], MD  cetirizine (ZYRTEC) 10 MG tablet Take 10 mg by mouth daily.    [provider]  CHANTIX 1 MG tablet TAKE ONE TABLET BY MOUTH TWICE DAILY 11/13/17   [name], MD  Coenzyme Q10 (COQ10) 100 MG CAPS Take 100 mg by mouth daily.     [provider]  cyanocobalamin (,VITAMIN B-12,) 1000 MCG/ML injection Inject 1 mL (1,000 mcg total) into the muscle every 14 (fourteen) days. 01/27/17   [name], MD  montelukast (SINGULAIR) 10 MG tablet TAKE ONE TABLET AT BEDTIME 07/31/17   [name], MD  NEEDLE, DISP, 25 G 25G X 1" MISC Inject 1 Dose into the muscle every 30 (thirty) days. 12/24/15   Lloyd Huger, MD  simvastatin (ZOCOR) 40 MG tablet TAKE 1 TABLET DAILY AT 6PM 11/03/17   [name], MD  SYRINGE DISPOSABLE 3CC 3 ML MISC Inject 1 Dose into the muscle every 30 (thirty) days. 12/24/15   Lloyd Huger, MD  tamsulosin (FLOMAX) 0.4  MG CAPS capsule TAKE 1 CAPSULE BY MOUTH EVERY DAY 02/09/18   [name], MD    Family History Family History  Problem Relation Age of Onset  . Stroke Mother   . Heart disease Mother   . Mental illness Mother   . COPD Mother   . Cancer Father 53       brain ca  . COPD Father   . Early death Father   . Coronary artery disease Brother 20       [employer] , nonsmoker     Social History Social History   Tobacco Use  . Smoking status: Current Every Day Smoker    Packs/day: 1.50    Types: Cigarettes    Last attempt to quit: 08/05/2013    Years since quitting: 4.5  . Smokeless tobacco: Never Used  . Tobacco comment: since age 85.  has cut back to 1 PPD  Substance Use Topics  . Alcohol use: Yes    Alcohol/week: 0.0 standard drinks    Comment: Holidays  . Drug  use: No     Allergies   Sulfa antibiotics   Review of Systems Review of Systems  Constitutional: Negative for fever.  Musculoskeletal: Positive for arthralgias.  Skin: Positive for wound. Negative for color change, pallor and rash.  Neurological: Negative for dizziness, light-headedness and numbness.     Physical Exam Updated Vital Signs BP (!) 170/77 (BP Location: Left Arm)   Pulse 62   Temp 98 F (36.7 C) (Oral)   Resp 16   Ht 5\' 10"  (1.778 m)   Wt 83.5 kg   SpO2 98%   BMI 26.40 kg/m   Physical Exam Constitutional:      Appearance: He is well-developed.  HENT:     Head: Normocephalic and atraumatic.  Eyes:     Conjunctiva/sclera: Conjunctivae normal.  Neck:     Musculoskeletal: Normal range of motion.  Cardiovascular:     Rate and Rhythm: Normal rate.  Pulmonary:     Effort: Pulmonary effort is normal. No respiratory distress.  Musculoskeletal:     Comments: Examination of the left hand shows amputation of the left index finger along the distal middle phalanx.  Bleeding is well controlled.  There is no visible or palpable foreign body.  Wound is clean.  Small fragment of superficial bone is removed.  Tip of the remaining middle phalanx is palpable with very little overlying soft tissue.  No visible exposed bone.  Skin:    General: Skin is warm.     Findings: No rash.  Neurological:     Mental Status: He is alert and oriented to person, place, and time.  Psychiatric:        Behavior: Behavior normal.        Thought Content: Thought content normal.      ED Treatments / Results  Labs (all labs ordered are listed, but only abnormal results are displayed) Labs Reviewed - No data to display  EKG None  Radiology Dg Finger Index Left  Result Date: 02/25/2018 CLINICAL DATA:  Table saw injury EXAM: LEFT INDEX FINGER 2+V COMPARISON:  None. FINDINGS: Traumatic amputation of the second digit at the level of the mid phalanx. Comminuted fracture with bone  fragments. Soft tissue injury. No foreign body. IMPRESSION: Traumatic amputation at the level of the second middle phalanx. Electronically Signed   By: Franchot Gallo M.D.   On: 02/25/2018 11:06    Procedures .Nerve Block Date/Time: 02/25/2018 11:28 AM Performed by:  Duanne Guess, PA-C Authorized by: Duanne Guess, PA-C   Consent:    Consent obtained:  Verbal   Consent given by:  Patient   Risks discussed:  Infection and pain   Alternatives discussed:  No treatment Indications:    Indications:  Pain relief and procedural anesthesia Location:    Body area:  Upper extremity   Laterality:  Left Pre-procedure details:    Skin preparation:  Alcohol Procedure details (see MAR for exact dosages):    Block needle gauge:  25 G   Anesthetic injected:  Lidocaine 1% w/o epi   Injection procedure:  Anatomic landmarks identified and introduced needle   Paresthesia:  Immediately resolved Post-procedure details:    Dressing:  None   Outcome:  Anesthesia achieved   Patient tolerance of procedure:  Tolerated well, no immediate complications  .Ortho Injury Treatment Date/Time: 02/25/2018 1:29 PM Performed by: Duanne Guess, PA-C Authorized by: Duanne Guess, PA-C   Consent:    Consent obtained:  Verbal   Consent given by:  PatientInjury location: finger Location details: left index finger Injury type: soft tissue Comments: After adequate anesthesia achieved with digital block.  Sterile field was set up.  Distal portion of the left index finger was soaked in Betadine and saline.  Sterile gauze was used to view the area of soft tissue in a bloodless field.  Visible and palpable portion of bone was resected past the soft tissue.  Wound was thoroughly irrigated of all bony fragments.  Soft tissues were then sutured with simple interrupted sutures with a 4-0 nylon suture.  6 sutures were placed.  Sterile dressing with Vaseline gauze was applied.  No bleeding noted after flap repair placed  with sutures.    (including critical care time)  Medications Ordered in ED Medications  Tdap (BOOSTRIX) injection 0.5 mL (has no administration in time range)  Ampicillin-Sulbactam (UNASYN) 3 g in sodium chloride 0.9 % 100 mL IVPB (has no administration in time range)  morphine 4 MG/ML injection 4 mg (has no administration in time range)  ondansetron (ZOFRAN) injection 4 mg (has no administration in time range)  lidocaine (PF) (XYLOCAINE) 1 % injection 5 mL (5 mLs Infiltration Given 02/25/18 1102)     Initial Impression / Assessment and Plan / ED Course  I have reviewed the triage vital signs and the nursing notes.  Pertinent labs & imaging results that were available during my care of the patient were reviewed by me and considered in my medical decision making (see chart for details).        58 year old male with fracture amputation of the left index finger.  Tetanus updated in the emergency department.  Discussed case with orthopedics who recommended resecting visible portion of the middle phalanx past soft tissue and repairing flap along the tip of the digit.  Patient given 3 g of IV Unasyn.  He is sent home with oxycodone and phylactic clindamycin.  Patient will follow-up with orthopedics.  He understands signs and symptoms return to ED for.  Final Clinical Impressions(s) / ED Diagnoses   Final diagnoses:  Amputation of finger, initial encounter  Open displaced fracture of middle phalanx of left index finger, initial encounter    ED Discharge Orders    None       Renata Caprice 02/25/18 1332    Lavonia Drafts, MD 02/25/18 1421

## 2018-02-25 NOTE — ED Triage Notes (Signed)
Pt to ED via POV c/o laceration to the left index finger. Pt cut the tip of his finger off. Bleeding controlled at this time. Pt is in NAD

## 2018-02-27 DIAGNOSIS — S68111A Complete traumatic metacarpophalangeal amputation of left index finger, initial encounter: Secondary | ICD-10-CM | POA: Diagnosis not present

## 2018-03-14 DIAGNOSIS — S68111A Complete traumatic metacarpophalangeal amputation of left index finger, initial encounter: Secondary | ICD-10-CM | POA: Diagnosis not present

## 2018-03-27 ENCOUNTER — Other Ambulatory Visit: Payer: Self-pay | Admitting: Internal Medicine

## 2018-04-11 ENCOUNTER — Ambulatory Visit (INDEPENDENT_AMBULATORY_CARE_PROVIDER_SITE_OTHER): Payer: BLUE CROSS/BLUE SHIELD | Admitting: Internal Medicine

## 2018-04-11 DIAGNOSIS — E782 Mixed hyperlipidemia: Secondary | ICD-10-CM | POA: Diagnosis not present

## 2018-04-11 DIAGNOSIS — Z87891 Personal history of nicotine dependence: Secondary | ICD-10-CM | POA: Diagnosis not present

## 2018-04-11 DIAGNOSIS — F418 Other specified anxiety disorders: Secondary | ICD-10-CM

## 2018-04-11 DIAGNOSIS — S68119S Complete traumatic metacarpophalangeal amputation of unspecified finger, sequela: Secondary | ICD-10-CM

## 2018-04-11 NOTE — Progress Notes (Signed)
Virtual Visit via Video enchanced note  This visit type was conducted due to national recommendations for restrictions regarding the COVID-19 pandemic (e.g. social distancing).  This format is felt to be most appropriate for this patient at this time.  All issues noted in this document were discussed and addressed.  No physical exam was performed (except for noted visual exam findings with Video Visits).   I connected with@ on 04/13/18 at  2:30 PM EDT by a video enabled telemedicine application or telephone and verified that I am speaking with the correct person using two identifiers. Location patient: home Location provider: work or home office Persons participating in the virtual visit: patient, provider  I discussed the limitations, risks, security and privacy concerns of performing an evaluation and management service by telephone and the availability of in person appointments. I also discussed with the patient that there may be a patient responsible charge related to this service. The patient expressed understanding and agreed to proceed.  Reason for visit:  Follow up  On hypertension , tobacco abuse,  chronic pain and GAD     HPI:  HTN: Patient is taking his medications as prescribed and notes no adverse effects.  Home BP readings have not been  done lately,  But after today's visit his wife Juliann Pulse, an  Therapist, sports,  Checked his BP and reported it as 130/75.   he is avoiding added salt in her diet and walking regularly about 3 times per week for exercise    Chronic insomnia : improved ,  Now taking  xanax  Infrequently   Tobacco abuse:  He has been using chantix to  quit smoking and reports that he is only smoking  1-2 cigarettes per week;  still on chantix   Since his last visit he accidentally Self amputated the  Distal middle phalanx of his left  index finger  on a skill while constructing bird houses on Feb 23  He was treated in ED with irrigation, IV abx , resection of visible bone and flap  repair.  He was discharged on oral clindamycin  and oxycodone for pain control and  referred to Dorise Hiss who saw him on Feb 25  Who repeated x rays,  And removed the sutures on march 6 ..  Tetanus vaccination up to date .  He currently denies pain and has resumed his woodworking activities.   Treated for viral URI via E visit in mid February.  Symptoms resolved without antibiotics    ROS: See pertinent positives and negatives per HPI.  Past Medical History:  Diagnosis Date  . Anemia   . BPH (benign prostatic hyperplasia)   . Chest pain   . Dental bridge present    permanent - upper  . Heart murmur    followed by PCP  . Hyperlipidemia     Past Surgical History:  Procedure Laterality Date  . CARDIAC CATHETERIZATION  2012   normal, [place]  . PROSTATE BIOPSY  2012   normal    Family History  Problem Relation Age of Onset  . Stroke Mother   . Heart disease Mother   . Mental illness Mother   . COPD Mother   . Cancer Father 9       brain ca  . COPD Father   . Early death Father   . Coronary artery disease Brother 36       [employer] , nonsmoker     SOCIAL HX: married, tobacco user. IADLS   Current Outpatient  Medications:  .  ALPRAZolam (XANAX) 0.25 MG tablet, Take 1 tablet (0.25 mg total) by mouth at bedtime as needed for anxiety., Disp: 30 tablet, Rfl: 5 .  ASPIRIN 81 PO, Take by mouth daily., Disp: , Rfl:  .  B-D 3CC LUER-LOK SYR 25GX5/8" 25G X 5/8" 3 ML MISC, USE AS DIRECTED, Disp: 6 each, Rfl: 0 .  cetirizine (ZYRTEC) 10 MG tablet, Take 10 mg by mouth daily., Disp: , Rfl:  .  CHANTIX 1 MG tablet, TAKE ONE TABLET BY MOUTH TWICE DAILY, Disp: 60 tablet, Rfl: 2 .  Coenzyme Q10 (COQ10) 100 MG CAPS, Take 100 mg by mouth daily. , Disp: , Rfl:  .  cyanocobalamin (,VITAMIN B-12,) 1000 MCG/ML injection, Inject 1 mL (1,000 mcg total) into the muscle every 30 (thirty) days., Disp: 10 mL, Rfl: 0 .  montelukast (SINGULAIR) 10 MG tablet, TAKE ONE TABLET AT BEDTIME, Disp: 90 tablet,  Rfl: 1 .  NEEDLE, DISP, 25 G 25G X 1" MISC, Inject 1 Dose into the muscle every 30 (thirty) days., Disp: 12 each, Rfl: 0 .  simvastatin (ZOCOR) 40 MG tablet, TAKE 1 TABLET DAILY AT 6PM, Disp: 90 tablet, Rfl: 1 .  SYRINGE DISPOSABLE 3CC 3 ML MISC, Inject 1 Dose into the muscle every 30 (thirty) days., Disp: 12 each, Rfl: 0 .  tamsulosin (FLOMAX) 0.4 MG CAPS capsule, TAKE 1 CAPSULE BY MOUTH EVERY DAY, Disp: 90 capsule, Rfl: 1  EXAM:  VITALS per patient if applicable:  GENERAL: alert, oriented, appears well and in no acute distress  HEENT: atraumatic, conjunttiva clear, no obvious abnormalities on inspection of external nose and ears  NECK: normal movements of the head and neck  LUNGS: on inspection no signs of respiratory distress, breathing rate appears normal, no obvious gross SOB, gasping or wheezing  CV: no obvious cyanosis  MS: moves all visible extremities without noticeable abnormality. LEFT INDEX FINGER AMPUTATED , INCISIONS WELL HEALED ,NO REDNESS   PSYCH/NEURO: pleasant and cooperative, no obvious depression or anxiety, speech and thought processing grossly intact  ASSESSMENT AND PLAN:  Discussed the following assessment and plan:   Anxiety about health Secondary to financial stressors.  He is now using xanax intermittently for insomnia. The risks and benefits of benzodiazepine use were discussed with patient today including excessive sedation leading to respiratory depression,  impaired thinking/driving, and addiction.  Patient was advised to avoid concurrent use with alcohol, to use medication only as needed and not to share with others  .   Hyperlipidemia Controlled with simvastatin 40 mg daily for elevated 10 yr risk of  CAD based on Framingham calculation.  LDL is < 100. Triglycerides have improved with weight loss. .  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 Tetanus vaccination is up to date and he has not had any diarrhea despite taking clindamycin   History of tobacco abuse He has reduced his cigarette from daily to 1-2 per week using Chantix.  encouarged to continue using Chantix    Updated Medication  List Outpatient Encounter Medications as of 04/11/2018  Medication Sig  . ALPRAZolam (XANAX) 0.25 MG tablet Take 1 tablet (0.25 mg total) by mouth at bedtime as needed for anxiety.  . ASPIRIN 81 PO Take by mouth daily.  . B-D 3CC LUER-LOK SYR 25GX5/8" 25G X 5/8" 3 ML MISC USE AS DIRECTED  . cetirizine (ZYRTEC) 10 MG tablet Take 10 mg by mouth daily.  . CHANTIX 1 MG tablet TAKE ONE TABLET BY MOUTH TWICE DAILY  . Coenzyme Q10 (COQ10) 100 MG CAPS Take 100 mg by mouth daily.   . cyanocobalamin (,VITAMIN B-12,) 1000 MCG/ML injection Inject 1 mL (1,000 mcg total) into the muscle every 30 (thirty) days.  . montelukast (SINGULAIR) 10 MG tablet TAKE ONE TABLET AT BEDTIME  . NEEDLE, DISP, 25 G 25G X 1" MISC Inject 1 Dose into the muscle every 30 (thirty) days.  . simvastatin (ZOCOR) 40 MG tablet TAKE 1 TABLET DAILY AT 6PM  . SYRINGE DISPOSABLE 3CC 3 ML MISC Inject 1 Dose into the muscle every 30 (thirty) days.  . tamsulosin (FLOMAX) 0.4 MG CAPS capsule TAKE 1 CAPSULE BY MOUTH EVERY DAY  . [DISCONTINUED] oxyCODONE-acetaminophen (PERCOCET) 5-325 MG tablet Take 1 tablet by mouth every 4 (four) hours as needed for severe pain. (Patient not taking: Reported on 04/11/2018)   No facility-administered encounter medications on file as of 04/11/2018.      I discussed the assessment and treatment plan with the patient. The patient was provided an opportunity to  ask questions and all were answered. The patient agreed with the plan and demonstrated an understanding of the instructions.   The patient was advised to call back or seek an in-person evaluation if the symptoms worsen or if the condition fails to improve as anticipated.  I provided 25  minutes of non-face-to-face time during this encounter.   [name], MD

## 2018-04-13 DIAGNOSIS — S68119S Complete traumatic metacarpophalangeal amputation of unspecified finger, sequela: Secondary | ICD-10-CM

## 2018-04-13 HISTORY — DX: Complete traumatic metacarpophalangeal amputation of unspecified finger, sequela: S68.119S

## 2018-04-13 NOTE — Assessment & Plan Note (Signed)
He has reduced his cigarette from daily to 1-2 per week using Chantix.  encouarged to continue using Chantix

## 2018-04-13 NOTE — Assessment & Plan Note (Signed)
Index finger left hand is well healed from accidental amputation by skill saw Feb 25 2018 . Tetanus vaccination is up to date and he has not had any diarrhea despite taking clindamycin

## 2018-04-13 NOTE — Assessment & Plan Note (Signed)
Secondary to financial stressors.  He is now using xanax intermittently for insomnia. The risks and benefits of benzodiazepine use were discussed with patient today including excessive sedation leading to respiratory depression,  impaired thinking/driving, and addiction.  Patient was advised to avoid concurrent use with alcohol, to use medication only as needed and not to share with others  .

## 2018-04-13 NOTE — Assessment & Plan Note (Signed)
Controlled with simvastatin 40 mg daily for elevated 10 yr risk of  CAD based on Framingham calculation.  LDL is < 100. Triglycerides have improved with weight loss. .  Lab Results  Component Value Date   CHOL 153 10/31/2017   HDL 38.30 (L) 10/31/2017   LDLCALC 83 07/28/2017   LDLDIRECT 93.0 10/31/2017   TRIG 258.0 (H) 10/31/2017   CHOLHDL 4 10/31/2017   Lab Results  Component Value Date   ALT 22 10/31/2017   AST 20 10/31/2017   ALKPHOS 62 10/31/2017   BILITOT 0.5 10/31/2017

## 2018-05-01 ENCOUNTER — Other Ambulatory Visit: Payer: Self-pay

## 2018-05-01 MED ORDER — FLUTICASONE PROPIONATE 50 MCG/ACT NA SUSP: 2.0000 | Freq: Every day | NASAL | 1 refills | Status: DC

## 2018-05-11 ENCOUNTER — Other Ambulatory Visit: Payer: Self-pay | Admitting: Internal Medicine

## 2018-05-12 ENCOUNTER — Other Ambulatory Visit: Payer: Self-pay | Admitting: Internal Medicine

## 2018-05-12 DIAGNOSIS — J301 Allergic rhinitis due to pollen: Secondary | ICD-10-CM

## 2018-08-06 ENCOUNTER — Telehealth: Payer: Self-pay

## 2018-08-06 MED ORDER — CYANOCOBALAMIN 1000 MCG/ML IJ SOLN: 1000.0000 ug | INTRAMUSCULAR | 5 refills | Status: DC

## 2018-08-06 NOTE — Telephone Encounter (Signed)
Pt is only getting the b12 injection once a month but states that he feels better when he was taking it two times a month.

## 2018-08-06 NOTE — Telephone Encounter (Signed)
Copied from [facility] [id number]. Topic: General - Other >> Aug 06, 2018 11:00 AM Leward Quan A wrote: Reason for CRM: Patient called to inquire of Dr Derrel Nip does he need to just be taking the Vitamin B-12 once a month because he states that he felt better when he was taking it twice a month. Please advise  Please call  Ph# [phone]

## 2018-08-06 NOTE — Telephone Encounter (Signed)
He can take the b12 injection every 2 weeks if he feels better.  I will change the rx and send to pharmacy

## 2018-08-07 NOTE — Telephone Encounter (Signed)
Spoke with pt to let him know that he can take his b12 injections every two weeks and that Dr. Derrel Nip has sent in a new rx.

## 2018-10-26 ENCOUNTER — Other Ambulatory Visit: Payer: Self-pay | Admitting: Internal Medicine

## 2019-01-01 ENCOUNTER — Telehealth: Payer: Self-pay | Admitting: Internal Medicine

## 2019-01-01 NOTE — Telephone Encounter (Signed)
Yes you can work him in as a Hospital doctor  At 1`:15

## 2019-01-01 NOTE — Telephone Encounter (Signed)
Do you want me to work pt in tomorrow to discuss his back pain issues?

## 2019-01-01 NOTE — Telephone Encounter (Signed)
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 Derrel Nip this week.Marland Kitchen

## 2019-01-01 NOTE — Telephone Encounter (Signed)
LMTCB

## 2019-01-02 ENCOUNTER — Ambulatory Visit (INDEPENDENT_AMBULATORY_CARE_PROVIDER_SITE_OTHER): Payer: BC Managed Care – PPO | Admitting: Internal Medicine

## 2019-01-02 ENCOUNTER — Other Ambulatory Visit: Payer: Self-pay

## 2019-01-02 ENCOUNTER — Encounter: Payer: Self-pay | Admitting: Internal Medicine

## 2019-01-02 VITALS — Ht 70.0 in | Wt 183.0 lb

## 2019-01-02 DIAGNOSIS — M5387 Other specified dorsopathies, lumbosacral region: Secondary | ICD-10-CM

## 2019-01-02 MED ORDER — HYDROCODONE-ACETAMINOPHEN 10-325 MG PO TABS: 1.0000 | ORAL_TABLET | Freq: Four times a day (QID) | ORAL | 0 refills | Status: DC | PRN

## 2019-01-02 MED ORDER — PREDNISONE 10 MG PO TABS: ORAL_TABLET | ORAL | 0 refills | Status: DC

## 2019-01-02 MED ORDER — TIZANIDINE HCL 4 MG PO TABS: 4.0000 mg | ORAL_TABLET | Freq: Four times a day (QID) | ORAL | 0 refills | Status: DC | PRN

## 2019-01-02 NOTE — Progress Notes (Signed)
Virtual Visit via [email or web address]  This visit type was conducted due to national recommendations for restrictions regarding the COVID-19 pandemic (e.g. social distancing).  This format is felt to be most appropriate for this patient at this time.  All issues noted in this document were discussed and addressed.  No physical exam was performed (except for noted visual exam findings with Video Visits).   I connected with@ on 01/02/19 at  1:15 PM EST by a video enabled telemedicine application and verified that I am speaking with the correct person using two identifiers. Location patient: home Location provider: work or home office Persons participating in the virtual visit: patient, provider  I discussed the limitations, risks, security and privacy concerns of performing an evaluation and management service by telephone and the availability of in person appointments. I also discussed with the patient that there may be a patient responsible charge related to this service. The patient expressed understanding and agreed to proceed.  Reason for visit: work in for back a  HPI:  58 yr old male with no history of trauma or surgery presents with one week histORY OF  of sciatica involving the left side that began with a pain in the left SI joint after missing the bottom rung of the ladder while stepping down . He had been laboring for several hours  putting on a tin roof.  He went home and rested over the weekend and the pain improved,  but once he returned to week on Monday he developed muscle spasms as well as pain in the buttock which radiated distally .  Pain is aggravated by sitting  And in the last day or so has started to involve the right leg after driving his car.  He denies numbness, leg weakness, saddle parasthesias , urinary and bowel incontinence  and foot drop.     ROS: See pertinent positives and negatives per HPI.  Past Medical History:  Diagnosis Date  . Anemia   . BPH (benign prostatic  hyperplasia)   . Chest pain   . Dental bridge present    permanent - upper  . Heart murmur    followed by PCP  . Hyperlipidemia     Past Surgical History:  Procedure Laterality Date  . CARDIAC CATHETERIZATION  2012   normal, [place]  . PROSTATE BIOPSY  2012   normal    Family History  Problem Relation Age of Onset  . Stroke Mother   . Heart disease Mother   . Mental illness Mother   . COPD Mother   . Cancer Father 22       brain ca  . COPD Father   . Early death Father   . Coronary artery disease Brother 44       [employer] , nonsmoker     SOCIAL HX:  reports that he quit smoking about a year ago. His smoking use included cigarettes. He smoked 1.50 packs per day. He has never used smokeless tobacco. He reports current alcohol use. He reports that he does not use drugs.   Current Outpatient Medications:  .  ALPRAZolam (XANAX) 0.25 MG tablet, Take 1 tablet (0.25 mg total) by mouth at bedtime as needed for anxiety., Disp: 30 tablet, Rfl: 5 .  ASPIRIN 81 PO, Take by mouth daily., Disp: , Rfl:  .  B-D 3CC LUER-LOK SYR 25GX5/8" 25G X 5/8" 3 ML MISC, USE AS DIRECTED, Disp: 6 each, Rfl: 0 .  cetirizine (ZYRTEC) 10 MG tablet, Take 10 mg by  mouth daily., Disp: , Rfl:  .  Coenzyme Q10 (COQ10) 100 MG CAPS, Take 100 mg by mouth daily. , Disp: , Rfl:  .  cyanocobalamin (,VITAMIN B-12,) 1000 MCG/ML injection, Inject 1 mL (1,000 mcg total) into the muscle every 14 (fourteen) days., Disp: 10 mL, Rfl: 5 .  fluticasone (FLONASE) 50 MCG/ACT nasal spray, Place 2 sprays into both nostrils daily., Disp: 48 g, Rfl: 1 .  montelukast (SINGULAIR) 10 MG tablet, TAKE ONE TABLET AT BEDTIME, Disp: 90 tablet, Rfl: 1 .  NEEDLE, DISP, 25 G 25G X 1" MISC, Inject 1 Dose into the muscle every 30 (thirty) days., Disp: 12 each, Rfl: 0 .  simvastatin (ZOCOR) 40 MG tablet, TAKE ONE TABLET BY MOUTH EVERY DAY, Disp: 90 tablet, Rfl: 1 .  SYRINGE DISPOSABLE 3CC 3 ML MISC, Inject 1 Dose into the muscle every 30 (thirty)  days., Disp: 12 each, Rfl: 0 .  tamsulosin (FLOMAX) 0.4 MG CAPS capsule, TAKE 1 CAPSULE EVERY DAY, Disp: 90 capsule, Rfl: 1 .  HYDROcodone-acetaminophen (NORCO) 10-325 MG tablet, Take 1 tablet by mouth every 6 (six) hours as needed., Disp: 30 tablet, Rfl: 0 .  predniSONE (DELTASONE) 10 MG tablet, 6 tablets on Day 1 , then reduce by 1 tablet daily until gone, Disp: 21 tablet, Rfl: 0 .  tiZANidine (ZANAFLEX) 4 MG tablet, Take 1 tablet (4 mg total) by mouth every 6 (six) hours as needed for muscle spasms., Disp: 30 tablet, Rfl: 0  EXAM:  VITALS per patient if applicable:  GENERAL: alert, oriented, appears well and in no acute distress  HEENT: atraumatic, conjunttiva clear, no obvious abnormalities on inspection of external nose and ears  NECK: normal movements of the head and neck  LUNGS: on inspection no signs of respiratory distress, breathing rate appears normal, no obvious gross SOB, gasping or wheezing  CV: no obvious cyanosis  MS: moves all visible extremities without noticeable abnormality. Able to stand up straight.  ROM limited to flexion of 30 degrees due to pain .  Positive straight leg lift bilaterally . Can point toe and dorsiflex foot    PSYCH/NEURO: pleasant and cooperative, no obvious depression or anxiety, speech and thought processing grossly intact  ASSESSMENT AND PLAN:  Discussed the following assessment and plan:  Sciatica associated with disorder of lumbosacral spine - Plan: DG Lumbar Spine Complete, DG Sacrum/Coccyx  Sciatica associated with disorder of lumbosacral spine Hydrocodone, Prednisone taper, muscle relaxer.   Plain films of lumbar spine an SI joint to rule out fracture.  If negative,  Will recommend trial of Back extension exercises.     I discussed the assessment and treatment plan with the patient. The patient was provided an opportunity to ask questions and all were answered. The patient agreed with the plan and demonstrated an understanding of the  instructions.   The patient was advised to call back or seek an in-person evaluation if the symptoms worsen or if the condition fails to improve as anticipated.  I provided  25 minutes of non-face-to-face time during this encounter reviewing patient's current problems and past procedures/imaging studies, providing counseling on the above mentioned problems , and coordination  of care .   Alexander Mc, MD

## 2019-01-02 NOTE — Progress Notes (Signed)
Constant pain above left buttocks but when he bends over the pain shoots across his back.

## 2019-01-02 NOTE — Telephone Encounter (Signed)
Spoke with pt and he is scheduled for today at 1:15pm.

## 2019-01-03 ENCOUNTER — Telehealth: Payer: Self-pay | Admitting: Internal Medicine

## 2019-01-03 DIAGNOSIS — M5387 Other specified dorsopathies, lumbosacral region: Secondary | ICD-10-CM | POA: Insufficient documentation

## 2019-01-03 NOTE — Telephone Encounter (Signed)
Re-faxed to correct number given below.

## 2019-01-03 NOTE — Assessment & Plan Note (Signed)
Hydrocodone, Prednisone taper, muscle relaxer.   Plain films of lumbar spine an SI joint to rule out fracture.  If negative,  Will recommend trial of Back extension exercises.

## 2019-01-03 NOTE — Telephone Encounter (Signed)
Pt called to give the correct fax number to [employer] [phone].

## 2019-01-22 ENCOUNTER — Other Ambulatory Visit: Payer: Self-pay | Admitting: Internal Medicine

## 2019-01-22 DIAGNOSIS — J301 Allergic rhinitis due to pollen: Secondary | ICD-10-CM

## 2019-01-23 ENCOUNTER — Other Ambulatory Visit: Payer: Self-pay | Admitting: Lab

## 2019-01-23 MED ORDER — SIMVASTATIN 40 MG PO TABS: ORAL_TABLET | ORAL | 1 refills | Status: DC

## 2019-04-25 ENCOUNTER — Other Ambulatory Visit: Payer: Self-pay | Admitting: Internal Medicine

## 2019-04-25 DIAGNOSIS — J301 Allergic rhinitis due to pollen: Secondary | ICD-10-CM

## 2019-06-19 DIAGNOSIS — K644 Residual hemorrhoidal skin tags: Secondary | ICD-10-CM | POA: Diagnosis not present

## 2019-07-29 ENCOUNTER — Other Ambulatory Visit: Payer: Self-pay | Admitting: Internal Medicine

## 2019-07-29 DIAGNOSIS — J301 Allergic rhinitis due to pollen: Secondary | ICD-10-CM

## 2019-10-28 ENCOUNTER — Other Ambulatory Visit: Payer: Self-pay | Admitting: Internal Medicine

## 2020-01-29 ENCOUNTER — Other Ambulatory Visit: Payer: Self-pay | Admitting: Internal Medicine

## 2020-02-25 NOTE — Telephone Encounter (Signed)
Left message to call office.

## 2020-03-23 ENCOUNTER — Other Ambulatory Visit: Payer: Self-pay

## 2020-03-23 ENCOUNTER — Ambulatory Visit (INDEPENDENT_AMBULATORY_CARE_PROVIDER_SITE_OTHER): Payer: BC Managed Care – PPO | Admitting: Internal Medicine

## 2020-03-23 ENCOUNTER — Encounter: Payer: Self-pay | Admitting: Internal Medicine

## 2020-03-23 VITALS — BP 134/62 | HR 56 | Temp 98.2°F | Resp 14 | Ht 70.0 in | Wt 195.8 lb

## 2020-03-23 DIAGNOSIS — C61 Malignant neoplasm of prostate: Secondary | ICD-10-CM | POA: Diagnosis not present

## 2020-03-23 DIAGNOSIS — E782 Mixed hyperlipidemia: Secondary | ICD-10-CM | POA: Diagnosis not present

## 2020-03-23 DIAGNOSIS — E538 Deficiency of other specified B group vitamins: Secondary | ICD-10-CM | POA: Diagnosis not present

## 2020-03-23 DIAGNOSIS — D594 Other nonautoimmune hemolytic anemias: Secondary | ICD-10-CM

## 2020-03-23 DIAGNOSIS — Z0000 Encounter for general adult medical examination without abnormal findings: Secondary | ICD-10-CM

## 2020-03-23 DIAGNOSIS — E663 Overweight: Secondary | ICD-10-CM

## 2020-03-23 DIAGNOSIS — Z114 Encounter for screening for human immunodeficiency virus [HIV]: Secondary | ICD-10-CM

## 2020-03-23 LAB — COMPREHENSIVE METABOLIC PANEL
ALT: 13 U/L (ref 0–53)
AST: 16 U/L (ref 0–37)
Albumin: 4.5 g/dL (ref 3.5–5.2)
Alkaline Phosphatase: 60 U/L (ref 39–117)
BUN: 14 mg/dL (ref 6–23)
CO2: 28 mEq/L (ref 19–32)
Calcium: 9.2 mg/dL (ref 8.4–10.5)
Chloride: 104 mEq/L (ref 96–112)
Creatinine, Ser: 0.97 mg/dL (ref 0.40–1.50)
GFR: 84.75 mL/min (ref >60.00)
Glucose, Bld: 101 mg/dL — ABNORMAL HIGH (ref 70–99)
Potassium: 4.5 mEq/L (ref 3.5–5.1)
Sodium: 139 mEq/L (ref 135–145)
Total Bilirubin: 0.5 mg/dL (ref 0.2–1.2)
Total Protein: 6.7 g/dL (ref 6.0–8.3)

## 2020-03-23 LAB — CBC WITH DIFFERENTIAL/PLATELET
Basophils Absolute: 0 10*3/uL (ref 0.0–0.1)
Basophils Relative: 0.3 % (ref 0.0–3.0)
Eosinophils Absolute: 0.1 10*3/uL (ref 0.0–0.7)
Eosinophils Relative: 1.8 % (ref 0.0–5.0)
HCT: 40.6 % (ref 39.0–52.0)
Hemoglobin: 13.8 g/dL (ref 13.0–17.0)
Lymphocytes Relative: 29.2 % (ref 12.0–46.0)
Lymphs Abs: 1.9 10*3/uL (ref 0.7–4.0)
MCHC: 34 g/dL (ref 30.0–36.0)
MCV: 85.4 fl (ref 78.0–100.0)
Monocytes Absolute: 0.5 10*3/uL (ref 0.1–1.0)
Monocytes Relative: 8.1 % (ref 3.0–12.0)
Neutro Abs: 3.9 10*3/uL (ref 1.4–7.7)
Neutrophils Relative %: 60.6 % (ref 43.0–77.0)
Platelets: 172 10*3/uL (ref 150.0–400.0)
RBC: 4.76 Mil/uL (ref 4.22–5.81)
RDW: 14.4 % (ref 11.5–15.5)
WBC: 6.4 10*3/uL (ref 4.0–10.5)

## 2020-03-23 LAB — PSA: PSA: 0.31 ng/mL (ref 0.10–4.00)

## 2020-03-23 LAB — LIPID PANEL
Cholesterol: 174 mg/dL (ref 0–200)
HDL: 42.7 mg/dL (ref >39.00)
LDL Cholesterol: 101 mg/dL — ABNORMAL HIGH (ref 0–99)
NonHDL: 131.44
Total CHOL/HDL Ratio: 4
Triglycerides: 154 mg/dL — ABNORMAL HIGH (ref 0.0–149.0)
VLDL: 30.8 mg/dL (ref 0.0–40.0)

## 2020-03-23 LAB — VITAMIN B12: Vitamin B-12: 535 pg/mL (ref 211–911)

## 2020-03-23 LAB — TSH: TSH: 3.63 u[IU]/mL (ref 0.35–4.50)

## 2020-03-23 MED ORDER — ZOSTER VAC RECOMB ADJUVANTED 50 MCG/0.5ML IM SUSR: 0.5000 mL | Freq: Once | INTRAMUSCULAR | 1 refills | Status: AC

## 2020-03-23 NOTE — Patient Instructions (Signed)
YOU NEED A FORMAL EYE EXAM TO RULE OUT GLAUCOMA, CATARACTS ETC     The ShingRx vaccine is now available in local pharmacies and is much more protective than the old one  Zostavax  (it is about 97%  Effective in preventing shingles). .   It is therefore ADVISED for all interested adults over 50 to prevent shingles so I have printed you a prescription for it.  (it requires a 2nd dose 2 to 6 months after the first one) .  It will cause you to have flu  like symptoms for 2 days   Health Maintenance, Male Adopting a healthy lifestyle and getting preventive care are important in promoting health and wellness. Ask your health care provider about:  The right schedule for you to have regular tests and exams.  Things you can do on your own to prevent diseases and keep yourself healthy. What should I know about diet, weight, and exercise? Eat a healthy diet  Eat a diet that includes plenty of vegetables, fruits, low-fat dairy products, and lean protein.  Do not eat a lot of foods that are high in solid fats, added sugars, or sodium.   Maintain a healthy weight Body mass index (BMI) is a measurement that can be used to identify possible weight problems. It estimates body fat based on height and weight. Your health care provider can help determine your BMI and help you achieve or maintain a healthy weight. Get regular exercise Get regular exercise. This is one of the most important things you can do for your health. Most adults should:  Exercise for at least 150 minutes each week. The exercise should increase your heart rate and make you sweat (moderate-intensity exercise).  Do strengthening exercises at least twice a week. This is in addition to the moderate-intensity exercise.  Spend less time sitting. Even light physical activity can be beneficial. Watch cholesterol and blood lipids Have your blood tested for lipids and cholesterol at 61 years of age, then have this test every 5 years. You may  need to have your cholesterol levels checked more often if:  Your lipid or cholesterol levels are high.  You are older than 61 years of age.  You are at high risk for heart disease. What should I know about cancer screening? Many types of cancers can be detected early and may often be prevented. Depending on your health history and family history, you may need to have cancer screening at various ages. This may include screening for:  Colorectal cancer.  Prostate cancer.  Skin cancer.  Lung cancer. What should I know about heart disease, diabetes, and high blood pressure? Blood pressure and heart disease  High blood pressure causes heart disease and increases the risk of stroke. This is more likely to develop in people who have high blood pressure readings, are of African descent, or are overweight.  Talk with your health care provider about your target blood pressure readings.  Have your blood pressure checked: ? Every 3-5 years if you are 47-60 years of age. ? Every year if you are 61 years old or older.  If you are between the ages of 67 and 83 and are a current or former smoker, ask your health care provider if you should have a one-time screening for abdominal aortic aneurysm (AAA). Diabetes Have regular diabetes screenings. This checks your fasting blood sugar level. Have the screening done:  Once every three years after age 35 if you are at a normal weight  and have a low risk for diabetes.  More often and at a younger age if you are overweight or have a high risk for diabetes. What should I know about preventing infection? Hepatitis B If you have a higher risk for hepatitis B, you should be screened for this virus. Talk with your health care provider to find out if you are at risk for hepatitis B infection. Hepatitis C Blood testing is recommended for:  Everyone born from [birth year] through 1966.  Anyone with known risk factors for hepatitis C. Sexually transmitted  infections (STIs)  You should be screened each year for STIs, including gonorrhea and chlamydia, if: ? You are sexually active and are younger than 61 years of age. ? You are older than 61 years of age and your health care provider tells you that you are at risk for this type of infection. ? Your sexual activity has changed since you were last screened, and you are at increased risk for chlamydia or gonorrhea. Ask your health care provider if you are at risk.  Ask your health care provider about whether you are at high risk for HIV. Your health care provider may recommend a prescription medicine to help prevent HIV infection. If you choose to take medicine to prevent HIV, you should first get tested for HIV. You should then be tested every 3 months for as long as you are taking the medicine. Follow these instructions at home: Lifestyle  Do not use any products that contain nicotine or tobacco, such as cigarettes, e-cigarettes, and chewing tobacco. If you need help quitting, ask your health care provider.  Do not use street drugs.  Do not share needles.  Ask your health care provider for help if you need support or information about quitting drugs. Alcohol use  Do not drink alcohol if your health care provider tells you not to drink.  If you drink alcohol: ? Limit how much you have to 0-2 drinks a day. ? Be aware of how much alcohol is in your drink. In the [place] one drink equals one 12 oz bottle of beer (355 mL), one 5 oz glass of wine (148 mL), or one 1 oz glass of hard liquor (44 mL). General instructions  Schedule regular health, dental, and eye exams.  Stay current with your vaccines.  Tell your health care provider if: ? You often feel depressed. ? You have ever been abused or do not feel safe at home. Summary  Adopting a healthy lifestyle and getting preventive care are important in promoting health and wellness.  Follow your health care provider's instructions about  healthy diet, exercising, and getting tested or screened for diseases.  Follow your health care provider's instructions on monitoring your cholesterol and blood pressure. This information is not intended to replace advice given to you by your health care provider. Make sure you discuss any questions you have with your health care provider. Document Revised: 12/13/2017 Document Reviewed: 12/13/2017 Elsevier Patient Education  2021 [employer].

## 2020-03-23 NOTE — Progress Notes (Signed)
Patient ID: Alexander Little., male    DOB: 1960-01-14  Age: 60 y.o. MRN: [id number]  The patient is here for annual  examination and management of other chronic and acute problems.   The risk factors are reflected in the social history.  The roster of all physicians providing medical care to patient - is listed in the Snapshot section of the chart.  Activities of daily living:  The patient is 100% independent in all ADLs: dressing, toileting, feeding as well as independent mobility  Home safety : The patient has smoke detectors in the home. They wear seatbelts.  There are no firearms at home. There is no violence in the home.   There is no risks for hepatitis, STDs or HIV. There is no   history of blood transfusion. They have no travel history to infectious disease endemic areas of the world.  The patient has seen their dentist in the last six month. They have seen their eye doctor in the last year. They admit to slight hearing difficulty with regard to whispered voices and some television programs.  They have deferred audiologic testing in the last year.  They do not  have excessive sun exposure. Discussed the need for sun protection: hats, long sleeves and use of sunscreen if there is significant sun exposure.   Diet: the importance of a healthy diet is discussed. They do have a healthy diet.  The benefits of regular aerobic exercise were discussed. She walks 4 times per week ,  20 minutes.   Depression screen: there are no signs or vegative symptoms of depression- irritability, change in appetite, anhedonia, sadness/tearfullness.  Cognitive assessment: the patient manages all their financial and personal affairs and is actively engaged. They could relate day,date,year and events; recalled 2/3 objects at 3 minutes; performed clock-face test normally.  The following portions of the patient's history were reviewed and updated as appropriate: allergies, current medications, past family  history, past medical history,  past surgical history, past social history  and problem list.  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  Vision was 20/30 , right eyee  20/20 left eye.  Wears bifocals but only wears the for reading and watching TV.  Formal eye exam 5 years ago.     During the course of the visit the patient was educated and counseled about appropriate screening and preventive services including : fall prevention , diabetes screening, nutrition counseling, colorectal cancer screening, and recommended immunizations.    CC: The primary encounter diagnosis was B12 deficiency. 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 Last seen Dec 2020 . Wife given monthly IM injections of b12 2) Weight  Gain:  Addressed. Lifestyle changes recommended and screening tests ordered.   History Alexander Little has a past medical history of Anemia, BPH (benign prostatic hyperplasia), Chest pain, Dental bridge present, Heart murmur, and Hyperlipidemia.   He has a past surgical history that includes Prostate biopsy (2012) and Cardiac catheterization (2012).   His family history includes COPD in his father and mother; Cancer (age of onset: 45) in his father; Coronary artery disease (age of onset: 33) in his brother; Early death in his father; Heart disease in  his mother; Mental illness in his mother; Stroke in his mother.He reports that he quit smoking about 2 years ago. His smoking use included cigarettes. He smoked 1.50 packs per day. He has never used smokeless tobacco. He reports current alcohol use. He reports that he does not use drugs.  Outpatient Medications  Prior to Visit  Medication Sig Dispense Refill  . ALPRAZolam (XANAX) 0.25 MG tablet Take 1 tablet (0.25 mg total) by mouth at bedtime as needed for anxiety. 30 tablet 5  . ASPIRIN 81 PO Take by mouth daily.    . B-D 3CC LUER-LOK SYR 25GX5/8" 25G X 5/8" 3 ML MISC USE AS DIRECTED 6 each 0  . cetirizine (ZYRTEC) 10 MG tablet Take 10 mg by mouth daily.    . Coenzyme Q10 (COQ10) 100 MG CAPS Take 100 mg by mouth daily.     . cyanocobalamin (,VITAMIN B-12,) 1000 MCG/ML injection INJECT 1ML INTO THE MUCSLE EVERY 14 DAYS 10 mL 5  . fluticasone (FLONASE) 50 MCG/ACT nasal spray Place 2 sprays into both nostrils daily. 48 g 1  . HYDROcodone-acetaminophen (NORCO) 10-325 MG tablet Take 1 tablet by mouth every 6 (six) hours as needed. 30 tablet 0  . montelukast (SINGULAIR) 10 MG tablet TAKE ONE TABLET BY MOUTH AT BEDTIME 90 tablet 0  . simvastatin (ZOCOR) 40 MG tablet TAKE ONE TABLET BY MOUTH EVERY DAY 90 tablet 1  . tamsulosin (FLOMAX) 0.4 MG CAPS capsule TAKE 1 CAPSULE BY MOUTH EVERY DAY 90 capsule 0  . tiZANidine (ZANAFLEX) 4 MG tablet Take 1 tablet (4 mg total) by mouth every 6 (six) hours as needed for muscle spasms. 30 tablet 0  . NEEDLE, DISP, 25 G 25G X 1" MISC Inject 1 Dose into the muscle every 30 (thirty) days. (Patient not taking: Reported on 03/23/2020) 12 each 0  . predniSONE (DELTASONE) 10 MG tablet 6 tablets on Day 1 , then reduce by 1 tablet daily until gone (Patient not taking: Reported on 03/23/2020) 21 tablet 0  . SYRINGE DISPOSABLE 3CC 3 ML MISC Inject 1 Dose into the muscle every 30 (thirty) days. (Patient not taking: Reported on 03/23/2020) 12 each 0   No facility-administered medications prior to visit.    Review of Systems  Objective:  BP 134/62 (BP Location: Left Arm, Patient Position: Sitting, Cuff Size: Normal)   Pulse (!) 56   Temp 98.2 F (36.8 C) (Oral)   Resp 14   Ht 5\' 10"  (1.778 m)   Wt 195 lb 12.8 oz (88.8 kg)   SpO2 97%   BMI 28.09 kg/m   Physical  Exam    Assessment & Plan:   Problem List Items Addressed This Visit      Unprioritized   Hemolytic anemia ([place])    Suggested during admission for symptomatic anemia,  And treated with steroids, but all appropriate screening tests for conditions commonly causing hemolysis were negative and per oncology the [facility] panel was run incorrectly. His anemia remains resolved with b12 supplementation and platelets have returned to normal as well.   Lab Results  Component Value Date   WBC 6.4 03/23/2020   HGB 13.8 03/23/2020   HCT 40.6 03/23/2020   MCV 85.4 03/23/2020   PLT 172.0 03/23/2020         B12 deficiency - Primary    Diagnosed during admission for  severe hemolytic anemia. Continue current schedule of  injections done by his wife .   Lab Results  Component Value Date   VITAMINB12  535 03/23/2020         Relevant Orders   Vitamin B12 (Completed)   CBC with Differential/Platelet (Completed)   Overweight (BMI 25.0-29.9)    12 lb weight gain since lAST VISIT NOTED.  I have addressed  BMI and recommended a low glycemic index diet utilizing smaller more frequent meals to increase metabolism.  I have also recommended that patient start exercising with a goal of 30 minutes of aerobic exercise a minimum of 5 days per week. Screening for lipid disorders, thyroid and fasting glucose screen for diabetes were normal today   Lab Results  Component Value Date   TSH 3.63 03/23/2020   No results found for: HGBA1C Lab Results  Component Value Date   CHOL 174 03/23/2020   HDL 42.70 03/23/2020   LDLCALC 101 (H) 03/23/2020   LDLDIRECT 93.0 10/31/2017   TRIG 154.0 (H) 03/23/2020   CHOLHDL 4 03/23/2020         Hyperlipidemia   Relevant Orders   Lipid panel (Completed)   Comprehensive metabolic panel (Completed)   TSH (Completed)   Encounter for preventive health examination    age appropriate education and counseling updated, referrals for preventative services and immunizations  addressed, dietary and smoking counseling addressed, most recent labs reviewed.  I have personally reviewed and have noted:  1) the patient's medical and social history 2) The pt's use of alcohol, tobacco, and illicit drugs 3) The patient's current medications and supplements 4) Functional ability including ADL's, fall risk, home safety risk, hearing and visual impairment 5) Diet and physical activities 6) Evidence for depression or mood disorder 7) The patient's height, weight, and BMI have been recorded in the chart  I have made referrals, and provided counseling and education based on review of the above  Lab Results  Component Value Date   PSA 0.31 03/23/2020   PSA 0.3 07/28/2017   PSA 0.26 07/26/2016           Other Visit Diagnoses    Prostate cancer ([place])       Relevant Orders   PSA (Completed)   Screening for HIV (human immunodeficiency virus)       Relevant Orders   HIV Antibody (routine testing w rflx) (Completed)      I have discontinued Loralee Pacas. Delacruz Jr.'s NEEDLE (DISP) 25 G, SYRINGE DISPOSABLE 3CC, and predniSONE. I am also having him start on Zoster Vaccine Adjuvanted. Additionally, I am having him maintain his CoQ10, ASPIRIN 81 PO, B-D 3CC LUER-LOK SYR 25GX5/8", cetirizine, ALPRAZolam, fluticasone, tiZANidine, HYDROcodone-acetaminophen, montelukast, cyanocobalamin, simvastatin, and tamsulosin.  Meds ordered this encounter  Medications  . Zoster Vaccine Adjuvanted [facility]) injection    Sig: Inject 0.5 mLs into the muscle once for 1 dose.    Dispense:  1 each    Refill:  1    Medications Discontinued During This Encounter  Medication Reason  . NEEDLE, DISP, 25 G 25G X 1" MISC   . predniSONE (DELTASONE) 10 MG tablet   . SYRINGE DISPOSABLE 3CC 3 ML MISC     Follow-up: Return in about 1 year (around 03/23/2021).   [name], MD

## 2020-03-24 ENCOUNTER — Encounter: Payer: Self-pay | Admitting: Internal Medicine

## 2020-03-24 DIAGNOSIS — E663 Overweight: Secondary | ICD-10-CM | POA: Insufficient documentation

## 2020-03-24 LAB — HIV ANTIBODY (ROUTINE TESTING W REFLEX): HIV 1&2 Ab, 4th Generation: NONREACTIVE

## 2020-03-24 NOTE — Assessment & Plan Note (Addendum)
age appropriate education and counseling updated, referrals for preventative services and immunizations addressed, dietary and smoking counseling addressed, most recent labs reviewed.  I have personally reviewed and have noted:  1) the patient's medical and social history 2) The pt's use of alcohol, tobacco, and illicit drugs 3) The patient's current medications and supplements 4) Functional ability including ADL's, fall risk, home safety risk, hearing and visual impairment 5) Diet and physical activities 6) Evidence for depression or mood disorder 7) The patient's height, weight, and BMI have been recorded in the chart  I have made referrals, and provided counseling and education based on review of the above  Lab Results  Component Value Date   PSA 0.31 03/23/2020   PSA 0.3 07/28/2017   PSA 0.26 07/26/2016

## 2020-03-24 NOTE — Assessment & Plan Note (Addendum)
12 lb weight gain since lAST VISIT NOTED.  I have addressed  BMI and recommended a low glycemic index diet utilizing smaller more frequent meals to increase metabolism.  I have also recommended that patient start exercising with a goal of 30 minutes of aerobic exercise a minimum of 5 days per week. Screening for lipid disorders, thyroid and fasting glucose screen for diabetes were normal today   Lab Results  Component Value Date   TSH 3.63 03/23/2020   No results found for: HGBA1C Lab Results  Component Value Date   CHOL 174 03/23/2020   HDL 42.70 03/23/2020   LDLCALC 101 (H) 03/23/2020   LDLDIRECT 93.0 10/31/2017   TRIG 154.0 (H) 03/23/2020   CHOLHDL 4 03/23/2020

## 2020-03-24 NOTE — Assessment & Plan Note (Signed)
Suggested during admission for symptomatic anemia,  And treated with steroids, but all appropriate screening tests for conditions commonly causing hemolysis were negative and per oncology the [facility] panel was run incorrectly. His anemia remains resolved with b12 supplementation and platelets have returned to normal as well.   Lab Results  Component Value Date   WBC 6.4 03/23/2020   HGB 13.8 03/23/2020   HCT 40.6 03/23/2020   MCV 85.4 03/23/2020   PLT 172.0 03/23/2020

## 2020-03-24 NOTE — Assessment & Plan Note (Signed)
Diagnosed during admission for  severe hemolytic anemia. Continue current schedule of  injections done by his wife .   Lab Results  Component Value Date   [id number] 03/23/2020

## 2020-03-29 MED ORDER — CYANOCOBALAMIN 1000 MCG/ML IJ SOLN: INTRAMUSCULAR | 5 refills | Status: DC

## 2020-04-29 ENCOUNTER — Other Ambulatory Visit: Payer: Self-pay | Admitting: Internal Medicine

## 2020-04-29 DIAGNOSIS — J301 Allergic rhinitis due to pollen: Secondary | ICD-10-CM

## 2020-07-20 ENCOUNTER — Other Ambulatory Visit: Payer: Self-pay | Admitting: Internal Medicine

## 2020-07-20 DIAGNOSIS — J301 Allergic rhinitis due to pollen: Secondary | ICD-10-CM

## 2020-10-22 ENCOUNTER — Other Ambulatory Visit: Payer: Self-pay | Admitting: Internal Medicine

## 2020-10-22 DIAGNOSIS — J301 Allergic rhinitis due to pollen: Secondary | ICD-10-CM

## 2021-01-11 ENCOUNTER — Other Ambulatory Visit: Payer: Self-pay | Admitting: Internal Medicine

## 2021-03-08 ENCOUNTER — Encounter: Payer: Self-pay | Admitting: Internal Medicine

## 2021-03-09 MED ORDER — FLUTICASONE PROPIONATE 50 MCG/ACT NA SUSP: 2.0000 | Freq: Every day | NASAL | 1 refills | Status: DC

## 2021-03-16 ENCOUNTER — Telehealth: Payer: Self-pay

## 2021-03-16 NOTE — Telephone Encounter (Signed)
PA for flonase has been submitted on covermymeds.  ?

## 2021-03-24 ENCOUNTER — Ambulatory Visit (INDEPENDENT_AMBULATORY_CARE_PROVIDER_SITE_OTHER): Payer: BC Managed Care – PPO | Admitting: Internal Medicine

## 2021-03-24 ENCOUNTER — Encounter: Payer: Self-pay | Admitting: Internal Medicine

## 2021-03-24 ENCOUNTER — Other Ambulatory Visit: Payer: Self-pay

## 2021-03-24 VITALS — BP 124/64 | HR 59 | Temp 98.1°F | Ht 70.0 in | Wt 199.0 lb

## 2021-03-24 DIAGNOSIS — Z1211 Encounter for screening for malignant neoplasm of colon: Secondary | ICD-10-CM

## 2021-03-24 DIAGNOSIS — Z87891 Personal history of nicotine dependence: Secondary | ICD-10-CM

## 2021-03-24 DIAGNOSIS — E538 Deficiency of other specified B group vitamins: Secondary | ICD-10-CM | POA: Diagnosis not present

## 2021-03-24 DIAGNOSIS — Z0001 Encounter for general adult medical examination with abnormal findings: Secondary | ICD-10-CM | POA: Insufficient documentation

## 2021-03-24 DIAGNOSIS — Z0000 Encounter for general adult medical examination without abnormal findings: Secondary | ICD-10-CM | POA: Diagnosis not present

## 2021-03-24 DIAGNOSIS — E782 Mixed hyperlipidemia: Secondary | ICD-10-CM

## 2021-03-24 DIAGNOSIS — Z79899 Other long term (current) drug therapy: Secondary | ICD-10-CM

## 2021-03-24 DIAGNOSIS — E041 Nontoxic single thyroid nodule: Secondary | ICD-10-CM | POA: Diagnosis not present

## 2021-03-24 DIAGNOSIS — R011 Cardiac murmur, unspecified: Secondary | ICD-10-CM

## 2021-03-24 DIAGNOSIS — D510 Vitamin B12 deficiency anemia due to intrinsic factor deficiency: Secondary | ICD-10-CM

## 2021-03-24 DIAGNOSIS — Z125 Encounter for screening for malignant neoplasm of prostate: Secondary | ICD-10-CM

## 2021-03-24 DIAGNOSIS — Z122 Encounter for screening for malignant neoplasm of respiratory organs: Secondary | ICD-10-CM

## 2021-03-24 DIAGNOSIS — R7301 Impaired fasting glucose: Secondary | ICD-10-CM

## 2021-03-24 LAB — COMPREHENSIVE METABOLIC PANEL
ALT: 15 U/L (ref 0–53)
AST: 23 U/L (ref 0–37)
Albumin: 4.4 g/dL (ref 3.5–5.2)
Alkaline Phosphatase: 70 U/L (ref 39–117)
BUN: 10 mg/dL (ref 6–23)
CO2: 28 mEq/L (ref 19–32)
Calcium: 8.9 mg/dL (ref 8.4–10.5)
Chloride: 104 mEq/L (ref 96–112)
Creatinine, Ser: 0.94 mg/dL (ref 0.40–1.50)
GFR: 87.39 mL/min (ref >60.00)
Glucose, Bld: 98 mg/dL (ref 70–99)
Potassium: 4.4 mEq/L (ref 3.5–5.1)
Sodium: 138 mEq/L (ref 135–145)
Total Bilirubin: 0.6 mg/dL (ref 0.2–1.2)
Total Protein: 6.7 g/dL (ref 6.0–8.3)

## 2021-03-24 LAB — CBC WITH DIFFERENTIAL/PLATELET
Basophils Absolute: 0 10*3/uL (ref 0.0–0.1)
Basophils Relative: 0.4 % (ref 0.0–3.0)
Eosinophils Absolute: 0.1 10*3/uL (ref 0.0–0.7)
Eosinophils Relative: 0.8 % (ref 0.0–5.0)
HCT: 39.3 % (ref 39.0–52.0)
Hemoglobin: 13.2 g/dL (ref 13.0–17.0)
Lymphocytes Relative: 24.6 % (ref 12.0–46.0)
Lymphs Abs: 1.8 10*3/uL (ref 0.7–4.0)
MCHC: 33.5 g/dL (ref 30.0–36.0)
MCV: 83.7 fl (ref 78.0–100.0)
Monocytes Absolute: 0.5 10*3/uL (ref 0.1–1.0)
Monocytes Relative: 6.8 % (ref 3.0–12.0)
Neutro Abs: 4.9 10*3/uL (ref 1.4–7.7)
Neutrophils Relative %: 67.4 % (ref 43.0–77.0)
Platelets: 176 10*3/uL (ref 150.0–400.0)
RBC: 4.7 Mil/uL (ref 4.22–5.81)
RDW: 14.7 % (ref 11.5–15.5)
WBC: 7.3 10*3/uL (ref 4.0–10.5)

## 2021-03-24 LAB — VITAMIN B12: Vitamin B-12: 501 pg/mL (ref 211–911)

## 2021-03-24 LAB — LIPID PANEL
Cholesterol: 156 mg/dL (ref 0–200)
HDL: 45.7 mg/dL (ref >39.00)
LDL Cholesterol: 90 mg/dL (ref 0–99)
NonHDL: 110.05
Total CHOL/HDL Ratio: 3
Triglycerides: 99 mg/dL (ref 0.0–149.0)
VLDL: 19.8 mg/dL (ref 0.0–40.0)

## 2021-03-24 LAB — HEMOGLOBIN A1C: Hgb A1c MFr Bld: 5.9 % (ref 4.6–6.5)

## 2021-03-24 LAB — PSA: PSA: 0.91 ng/mL (ref 0.10–4.00)

## 2021-03-24 LAB — TSH: TSH: 2.71 u[IU]/mL (ref 0.35–5.50)

## 2021-03-24 NOTE — Patient Instructions (Addendum)
For your allergies: ? ?Consider a daily nonsedating antihistamines INSTEAD of a steroid nasal spray (or combining them )  ? ?generic zyrtec, which is cetirizine.   ?Allegra is availabel generically as fexofenadine and it comes in 60 mg and 180 mg once daily strengths.  ?claritin is also available generically as loratidine .   ? ? ?Lung cancer screening referral has been made ? ? ? ? ? ?

## 2021-03-24 NOTE — Assessment & Plan Note (Signed)
Diagnosed during admission for  severe hemolytic anemia. Continue monthly  injections done by his wife .  ? ?Lab Results  ?Component Value Date  ? [id number] 03/24/2021  ? ? ?

## 2021-03-24 NOTE — Progress Notes (Signed)
The patient is here for annual preventive examination and management of other chronic and acute problems. ? ?This visit occurred during the SARS-CoV-2 public health emergency.  Safety protocols were in place, including screening questions prior to the visit, additional usage of staff PPE, and extensive cleaning of exam room while observing appropriate contact time as indicated for disinfecting solutions.  ? ?  ?The risk factors are reflected in the social history. ?  ?The roster of all physicians providing medical care to patient - is listed in the Snapshot section of the chart. ?  ?Activities of daily living:  The patient is 100% independent in all ADLs: dressing, toileting, feeding as well as independent mobility ?  ?Home safety : The patient has smoke detectors in the home. They wear seatbelts.  There are no unsecured firearms at home. There is no violence in the home.  ?  ?There is no risks for hepatitis, STDs or HIV. There is no   history of blood transfusion. They have no travel history to infectious disease endemic areas of the world. ?  ?The patient has seen their dentist in the last six month. They have seen their eye doctor in the last year. The patient  denies slight hearing difficulty with regard to whispered voices and some television programs.  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 They have deferred audiologic testing in the last year.  They do not  have excessive sun exposure. Discussed the need for sun protection: hats, long sleeves and use of sunscreen if there is significant sun exposure.  ?  ?Diet: the importance of a healthy diet is discussed. They do have a healthy diet. ?  ?The benefits of regular aerobic exercise were discussed. The patient  exercises  4 days per week  for  30 minutes.(Either walking or splitting wood)  ?  ?Depression screen: there are no signs or vegative symptoms of depression- irritability, change in appetite, anhedonia,  sadness/tearfullness. ?  ?The following portions of the patient's history were reviewed and updated as appropriate: allergies, current medications, past family history, past medical history,  past surgical history, past social history  and problem list. ?  ?Visual acuity was not assessed per patient preference since the patient has regular follow up with an  ophthalmologist. Hearing and body mass index were assessed and reviewed.  ?  ?During the course of the visit the patient was educated and counseled about appropriate screening and preventive services including : fall prevention , diabetes screening, nutrition counseling, colorectal cancer screening, and recommended immunizations.   ? ?Chief Complaint: ? ?Doing well.  Excited to be going to see his 2 grandkids  who live In [place]  3 yrs and 6 months.  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 ? X tobacco use.  Has not smoked in 3 years.  > 50 pack year history.  Discussed lung CA screening,  he Is interested if insurance covers  the cost    ? ? ?Review of Symptoms ? ?Patient denies headache, fevers, malaise, unintentional weight loss, skin rash, eye pain, sinus congestion and sinus pain, sore throat, dysphagia,  hemoptysis , cough, dyspnea, wheezing, chest pain, palpitations, orthopnea, edema, abdominal pain, nausea, melena, diarrhea, constipation, flank pain, dysuria, hematuria, urinary  Frequency, nocturia, numbness, tingling, seizures,  Focal weakness, Loss of consciousness,  Tremor, insomnia, depression, anxiety, and suicidal ideation.   ? ?  Physical Exam: ? ?BP 124/64 (BP Location: Left Arm, Patient Position: Sitting, Cuff Size: Normal)   Pulse (!) 59   Temp 98.1 ?F (36.7 ?C) (Oral)   Ht '5\' 10"'$  (1.778 m)   Wt 199 lb (90.3 kg)   SpO2 95%   BMI 28.55 kg/m?   ? ?General appearance: alert, cooperative and appears stated age ?Ears: normal TM's and external ear canals both ears ?Throat: lips, mucosa,  and tongue normal; teeth and gums normal ?Neck: no adenopathy, no carotid bruit, supple, symmetrical, trachea midline and thyroid not enlarged, symmetric, no tenderness/mass/nodules ?Back: symmetric, no curvature. ROM normal. No CVA tenderness. ?Lungs: clear to auscultation bilaterally ?Heart: regular rate and rhythm, S1, S2 normal, systolic murmur, nonradiating.  No click, rub or gallop ?Abdomen: soft, non-tender; bowel sounds normal; no masses,  no organomegaly ?Pulses: 2+ and symmetric ?Skin: Skin color, texture, turgor normal. No rashes or lesions ?Lymph nodes: Cervical, supraclavicular, and axillary nodes normal.  ? ?Assessment and Plan: ? ?Encounter for preventive health examination ?age appropriate education and counseling updated, referrals for preventative services and immunizations addressed, dietary and smoking counseling addressed, most recent labs reviewed.  I have personally reviewed and have noted: ?  ?1) the patient's medical and social history ?2) The pt's use of alcohol, tobacco, and illicit drugs ?3) The patient's current medications and supplements ?4) Functional ability including ADL's, fall risk, home safety risk, hearing and visual impairment ?5) Diet and physical activities ?6) Evidence for depression or mood disorder ?7) The patient's height, weight, and BMI have been recorded in the chart ?  ?I have made referrals, and provided counseling and education based on review of the above ? ?B12 deficiency anemia ?Diagnosed during admission for  severe hemolytic anemia. Continue monthly  injections done by his wife .  ? ?Lab Results  ?Component Value Date  ? [id number] 03/24/2021  ? ? ? ?Thyroid nodule ?Normal thyroid biopsy march 2015 at [facility], but repeat  biopsy was recommended in 6 months but deferred..  Thyroid function is normal.   ? ?Lab Results  ?Component Value Date  ? TSH 2.71 03/24/2021  ? ? ? ?Updated Medication List ?Outpatient Encounter Medications as of 03/24/2021  ?Medication Sig  ?  ALPRAZolam (XANAX) 0.25 MG tablet Take 1 tablet (0.25 mg total) by mouth at bedtime as needed for anxiety.  ? ASPIRIN 81 PO Take by mouth daily.  ? B-D 3CC LUER-LOK SYR 25GX5/8" 25G X 5/8" 3 ML MISC USE AS DIRECTED  ? cetirizine (ZYRTEC) 10 MG tablet Take 10 mg by mouth daily.  ? Coenzyme Q10 (COQ10) 100 MG CAPS Take 100 mg by mouth daily.   ? cyanocobalamin (,VITAMIN B-12,) 1000 MCG/ML injection INJECT 1ML INTO THE MUCSLE EVERY 14 DAYS  ? HYDROcodone-acetaminophen (NORCO) 10-325 MG tablet Take 1 tablet by mouth every 6 (six) hours as needed.  ? montelukast (SINGULAIR) 10 MG tablet TAKE 1 TABLET BY MOUTH AT BEDTIME  ? simvastatin (ZOCOR) 40 MG tablet TAKE 1 TABLET BY MOUTH DAILY  ? tamsulosin (FLOMAX) 0.4 MG CAPS capsule TAKE 1 CAPSULE BY MOUTH EVERY DAY  ? tiZANidine (ZANAFLEX) 4 MG tablet Take 1 tablet (4 mg total) by mouth every 6 (six) hours as needed for muscle spasms.  ? [DISCONTINUED] fluticasone (FLONASE) 50 MCG/ACT nasal spray Place 2 sprays into both nostrils daily. (Patient not taking: Reported on 03/24/2021)  ? ?No facility-administered encounter medications on file as of 03/24/2021.  ? ? ?

## 2021-03-24 NOTE — Assessment & Plan Note (Signed)
Normal thyroid biopsy march 2015 at [facility], but repeat  biopsy was recommended in 6 months but deferred..  Thyroid function is normal.   ? ?Lab Results  ?Component Value Date  ? TSH 2.71 03/24/2021  ? ? ?

## 2021-03-24 NOTE — Assessment & Plan Note (Signed)

## 2021-03-26 ENCOUNTER — Telehealth: Payer: Self-pay

## 2021-03-26 NOTE — Telephone Encounter (Signed)
-----   Message from [name], MD sent at 03/26/2021 12:38 PM EDT ----- ? your PSA , although normal,  has nearly tripled over the past year.  PSA velocity of this degree is associated with an increased risk of prostate cancer; therefore I am recommending an evaluation by a urologist and will start the referral once I hear from you that you agree to go see one.  If you have a preference to whom you are referred,  please let me know.  ? ?The rest of your labs are fine .  Your  fasting glucose has never been  diagnostic of diabetes; but your A1c of 5.9 suggests that you may be at  slightly increased  risk for developing type 2 Diabetes over the next ten years.  Reducing your daily intake of starches to 2 servings daily , eliminating refined sugars and getting back to a daily exercise regimen  has been shown to delay the progression to diabetes.   I would like to repeat NON FASTING labs In 6 months. ? ?Regards, ? ? ?Deborra Medina, MD    ? ?Regards, ? ? ?Deborra Medina, MD   ? ? ? ? ?  ?

## 2021-04-02 ENCOUNTER — Other Ambulatory Visit: Payer: Self-pay | Admitting: Internal Medicine

## 2021-04-24 ENCOUNTER — Other Ambulatory Visit: Payer: Self-pay | Admitting: Internal Medicine

## 2021-07-05 ENCOUNTER — Other Ambulatory Visit: Payer: Self-pay | Admitting: Internal Medicine

## 2021-07-05 DIAGNOSIS — J301 Allergic rhinitis due to pollen: Secondary | ICD-10-CM

## 2021-07-07 ENCOUNTER — Emergency Department: Payer: Worker's Compensation

## 2021-07-07 ENCOUNTER — Emergency Department
Admission: EM | Admit: 2021-07-07 | Discharge: 2021-07-07 | Disposition: A | Discharge status: Home / Self Care | Payer: Worker's Compensation | Attending: Emergency Medicine | Admitting: Emergency Medicine

## 2021-07-07 DIAGNOSIS — I10 Essential (primary) hypertension: Secondary | ICD-10-CM | POA: Diagnosis not present

## 2021-07-07 DIAGNOSIS — M546 Pain in thoracic spine: Secondary | ICD-10-CM | POA: Diagnosis not present

## 2021-07-07 DIAGNOSIS — I2510 Atherosclerotic heart disease of native coronary artery without angina pectoris: Secondary | ICD-10-CM | POA: Insufficient documentation

## 2021-07-07 DIAGNOSIS — R202 Paresthesia of skin: Secondary | ICD-10-CM | POA: Diagnosis not present

## 2021-07-07 DIAGNOSIS — R079 Chest pain, unspecified: Secondary | ICD-10-CM | POA: Insufficient documentation

## 2021-07-07 DIAGNOSIS — F17200 Nicotine dependence, unspecified, uncomplicated: Secondary | ICD-10-CM | POA: Insufficient documentation

## 2021-07-07 DIAGNOSIS — R0789 Other chest pain: Secondary | ICD-10-CM | POA: Diagnosis not present

## 2021-07-07 LAB — BASIC METABOLIC PANEL
Anion gap: 7 (ref 5–15)
BUN: 12 mg/dL (ref 8–23)
CO2: 24 mmol/L (ref 22–32)
Calcium: 9.1 mg/dL (ref 8.9–10.3)
Chloride: 108 mmol/L (ref 98–111)
Creatinine, Ser: 0.88 mg/dL (ref 0.61–1.24)
GFR, Estimated: >60 mL/min (ref >60)
Glucose, Bld: 107 mg/dL — ABNORMAL HIGH (ref 70–99)
Potassium: 4.1 mmol/L (ref 3.5–5.1)
Sodium: 139 mmol/L (ref 135–145)

## 2021-07-07 LAB — CBC
HCT: 40.4 % (ref 39.0–52.0)
Hemoglobin: 13.1 g/dL (ref 13.0–17.0)
MCH: 27.2 pg (ref 26.0–34.0)
MCHC: 32.4 g/dL (ref 30.0–36.0)
MCV: 83.8 fL (ref 80.0–100.0)
Platelets: 182 10*3/uL (ref 150–400)
RBC: 4.82 MIL/uL (ref 4.22–5.81)
RDW: 14.1 % (ref 11.5–15.5)
WBC: 6.6 10*3/uL (ref 4.0–10.5)
nRBC: 0 % (ref 0.0–0.2)

## 2021-07-07 LAB — TROPONIN I (HIGH SENSITIVITY)
Troponin I (High Sensitivity): 4 ng/L (ref <18)
Troponin I (High Sensitivity): 3 ng/L (ref <18)

## 2021-07-07 MED ORDER — CYCLOBENZAPRINE HCL 10 MG PO TABS: 10.0000 mg | ORAL_TABLET | Freq: Three times a day (TID) | ORAL | 0 refills | Status: AC | PRN

## 2021-07-07 MED ORDER — LIDOCAINE 5 % EX PTCH
1.0000 | MEDICATED_PATCH | CUTANEOUS | Status: DC
  Administered 2021-07-07: 1 via TRANSDERMAL
  Filled 2021-07-07: qty 1

## 2021-07-07 NOTE — ED Notes (Signed)
87 yom with a c/c of left sided chest discomfort especially upon inspiration.

## 2021-07-07 NOTE — ED Provider Notes (Signed)
[facility] Provider Note    Event Date/Time   First MD Initiated Contact with Patient 07/07/21 1101     (approximate)   History   Chest Pain   HPI  Alexander Little. is a 61 y.o. male with past medical history of anemia, BPH, HTN, HDL, tobacco abuse and reportedly nonobstructive CAD who presents for evaluation of some left upper back pain rating towards his shoulder around his left chest that he noticed around 6 AM this morning when he got to work.  He states he thinks he pulled a muscle.  He does not recall though any specific heavy lifting or twisting but states he has had similar episodes in the past.  He states it came on gradually over several minutes and was not sudden in onset.  He denies any fevers or shortness of breath.  States he has had a mild chronic cough for several weeks if not months which he attributes to pollen.  Denies any headache, earache, sore throat, abdominal pain, urinary symptoms, nausea, vomiting diarrhea rash or any focal extremity weakness or numbness but states that earlier today he had some tingling in his left hand that has since resolved.  He endorses tobacco abuse but denies EtOH or illicit drug use.    Past Medical History:  Diagnosis Date   Anemia    BPH (benign prostatic hyperplasia)    Chest pain    Dental bridge present    permanent - upper   Heart murmur    followed by PCP   Hyperlipidemia      Physical Exam  Triage Vital Signs: ED Triage Vitals  Enc Vitals Group     BP 07/07/21 0747 133/73     Pulse Rate 07/07/21 0747 67     Resp 07/07/21 0747 17     Temp 07/07/21 0747 97.9 F (36.6 C)     Temp Source 07/07/21 0747 Oral     SpO2 07/07/21 0747 96 %     Weight 07/07/21 0748 193 lb (87.5 kg)     Height 07/07/21 0748 '5\' 10"'$  (1.778 m)     Head Circumference --      Peak Flow --      Pain Score 07/07/21 0747 6     Pain Loc --      Pain Edu? --      Excl. in [place]? --     Most recent vital signs: Vitals:    07/07/21 0747 07/07/21 1135  BP: 133/73 112/68  Pulse: 67 68  Resp: 17 18  Temp: 97.9 F (36.6 C)   SpO2: 96% 97%    General: Awake, no distress.  CV:  Good peripheral perfusion.  2+ radial pulses.  No significant murmur. Resp:  Normal effort.  Clear bilaterally. Abd:  No distention.  Soft throughout. Other:  No significant lower extreme edema.  Sensation is intact to light touch throughout the bilateral upper extremities.  Patient has a very tight area of muscle spasm in the medial aspect of the left scapula extending up towards the trapezius.  He states his pain is much improved to palpation and massage from this examiner.  He states he also feels much better stretching his back with his left upper extremity stretched across his chest.   ED Results / Procedures / Treatments  Labs (all labs ordered are listed, but only abnormal results are displayed) Labs Reviewed  BASIC METABOLIC PANEL - Abnormal; Notable for the following components:  Result Value   Glucose, Bld 107 (*)    All other components within normal limits  CBC  TROPONIN I (HIGH SENSITIVITY)  TROPONIN I (HIGH SENSITIVITY)     EKG  EKG is remarkable for sinus rhythm with PACs, ventricular rate of 67, normal axis without clear evidence of acute ischemia or significant arrhythmia.   RADIOLOGY Chest x-ray my interpretation without clear focal consolidation, large effusion, pneumothorax, overt edema or other clear acute process although I reviewed radiology interpretation and agree with their findings of what appeared to be bandlike density projections over the costophrenic angles on the lateral use possibly atelectasis versus a nodule.  There is also evidence of aortic atherosclerosis.   PROCEDURES:  Critical Care performed: No  Procedures    MEDICATIONS ORDERED IN ED: Medications  lidocaine (LIDODERM) 5 % 1 patch (1 patch Transdermal Patch Applied 07/07/21 1149)     IMPRESSION / MDM / ASSESSMENT AND  PLAN / ED COURSE  I reviewed the triage vital signs and the nursing notes. Patient's presentation is most consistent with acute presentation with potential threat to life or bodily function.                               Differential diagnosis includes, but is not limited to ACS, apical pneumothorax, bronchitis, pneumonia possible muscle spasm as patient has palpable spasming muscle on his left trapezius and medial thoracic muscles around the left scapula.  He denies any shortness of breath and has no abnormal vital signs to suggest a PE at this time.  Clear reproducibility and source of pain gradual onset of symptoms today and patient stating he is already feeling much better after a massage and not out of his back I have a lower suspicion for dissection.  EKG is remarkable for sinus rhythm with PACs, ventricular rate of 67, normal axis without clear evidence of acute ischemia or significant arrhythmia.  Nonelevated troponin x2 is not suggestive of ACS.  Chest x-ray my interpretation without clear focal consolidation, large effusion, pneumothorax, overt edema or other clear acute process although I reviewed radiology interpretation and agree with their findings of what appeared to be bandlike density projections over the costophrenic angles on the lateral use possibly atelectasis versus a nodule.  There is also evidence of aortic atherosclerosis.Marland Kitchen  CBC without leukocytosis or acute anemia.  BMP without significant lecture light or metabolic derangements.  I suspect likely MSK with lower suspicion for other immediately life-threatening etiologies at this time.  Advised patient of incidental findings on his chest x-ray which will require nonemergent follow-up imaging to be coordinated by his PCP.  Patient also counseled on tobacco cessation.  He is requesting a couple doses of muscle relaxants which I will prescribe but I discussed only taking these when he is at home and can rest and not driving or  operating heavy machinery as it will make him quite drowsy.  He states he has a understanding of this.  Also advised using Tylenol 1 g every 6 hours and lidocaine patch.  Discharged in stable condition.  Strict return precautions advised and discussed.      FINAL CLINICAL IMPRESSION(S) / ED DIAGNOSES   Final diagnoses:  Acute left-sided thoracic back pain  Chest pain, unspecified type     Rx / DC Orders   ED Discharge Orders     None        Note:  This document was prepared using  [employer]agon [employer] forces training and education officer and may include unintentional dictation errors.   Lucrezia Starch, MD 07/07/21 [phone]

## 2021-07-07 NOTE — ED Triage Notes (Signed)
Pt c/o left sided chest pain that radiates into the back since 6am while on the way to work with tingling in the left arm. States it is worse with deep breathing and movement.

## 2021-08-13 ENCOUNTER — Encounter: Payer: Self-pay | Admitting: Oncology

## 2021-08-16 ENCOUNTER — Encounter: Payer: Self-pay | Admitting: Oncology

## 2021-10-08 ENCOUNTER — Other Ambulatory Visit: Payer: Self-pay | Admitting: Family

## 2021-10-27 ENCOUNTER — Other Ambulatory Visit: Payer: Self-pay | Admitting: Family

## 2021-10-27 DIAGNOSIS — J301 Allergic rhinitis due to pollen: Secondary | ICD-10-CM

## 2021-11-07 ENCOUNTER — Encounter: Payer: Self-pay | Admitting: Internal Medicine

## 2021-11-08 ENCOUNTER — Other Ambulatory Visit: Payer: Self-pay

## 2021-11-08 MED ORDER — TAMSULOSIN HCL 0.4 MG PO CAPS: 0.4000 mg | ORAL_CAPSULE | Freq: Every day | ORAL | 0 refills | Status: DC

## 2021-11-08 MED ORDER — SIMVASTATIN 40 MG PO TABS: 40.0000 mg | ORAL_TABLET | Freq: Every day | ORAL | 0 refills | Status: DC

## 2021-11-08 MED ORDER — TIZANIDINE HCL 4 MG PO TABS: 4.0000 mg | ORAL_TABLET | Freq: Four times a day (QID) | ORAL | 0 refills | Status: DC | PRN

## 2021-11-10 ENCOUNTER — Other Ambulatory Visit: Payer: Self-pay

## 2021-11-10 DIAGNOSIS — E538 Deficiency of other specified B group vitamins: Secondary | ICD-10-CM

## 2021-11-10 DIAGNOSIS — D594 Other nonautoimmune hemolytic anemias: Secondary | ICD-10-CM

## 2021-11-10 MED ORDER — "BD LUER-LOK SYRINGE 25G X 1"" 3 ML MISC": 2 refills | Status: AC

## 2021-11-10 MED ORDER — "BD LUER-LOK SYRINGE 25G X 5/8"" 3 ML MISC": 2 refills | Status: DC

## 2021-11-10 MED ORDER — "BD LUER-LOK SYRINGE 25G X 1"" 3 ML MISC": 2 refills | Status: DC

## 2021-11-10 MED ORDER — CYANOCOBALAMIN 1000 MCG/ML IJ SOLN: INTRAMUSCULAR | 0 refills | Status: DC

## 2022-01-28 ENCOUNTER — Other Ambulatory Visit: Payer: Self-pay | Admitting: Internal Medicine

## 2022-01-31 MED ORDER — SIMVASTATIN 40 MG PO TABS: 40.0000 mg | ORAL_TABLET | Freq: Every day | ORAL | 0 refills | Status: DC

## 2022-01-31 MED ORDER — TAMSULOSIN HCL 0.4 MG PO CAPS: 0.4000 mg | ORAL_CAPSULE | Freq: Every day | ORAL | 0 refills | Status: DC

## 2022-03-13 DIAGNOSIS — J069 Acute upper respiratory infection, unspecified: Secondary | ICD-10-CM | POA: Diagnosis not present

## 2022-03-24 ENCOUNTER — Telehealth: Payer: BC Managed Care – PPO | Admitting: Nurse Practitioner

## 2022-03-24 DIAGNOSIS — R052 Subacute cough: Secondary | ICD-10-CM

## 2022-03-24 NOTE — Progress Notes (Signed)
Alexander Little,  Due to the ongoing nature of this illness and already taking a round of antibiotics it is recommended you are evaluated in person to consider the need for a chest Xray at this time.    I feel your condition warrants further evaluation and I recommend that you be seen in a face to face visit.   NOTE: There will be NO CHARGE for this eVisit   If you are having a true medical emergency please call 911.      For an urgent face to face visit, [place] has eight urgent care centers for your convenience:   NEW!! [place] [facility] [place] at [place] Get Driving Directions [id number] [address] [place], [place] [facility] [place] [facility] [place] Get Driving Directions [id number] [place], [place] [address]   [place] [facility]) Get Driving Directions [id number] [address], [place] [address]  [place] [facility] [place] ([place]) Get Driving Directions [id number] [address] [place],  [place]  [address]  [place] [facility] [place] - at [address] Get Driving Directions  [id number] [address] [place],  [place] [address]   [place] [facility] Get Driving Directions [id number] [place], [place], [place] [address]   [place] [facility] Get Driving Directions  [id number] [address] [place], [place] [address]   [place] [facility] at [place] Get Driving Directions [id number] [address] [place],  [address]  Your MyChart E-visit questionnaire answers were reviewed by a board certified advanced clinical practitioner to complete your personal care plan based on your specific symptoms.  Thank you for using e-Visits.

## 2022-03-28 ENCOUNTER — Encounter: Payer: Self-pay | Admitting: Internal Medicine

## 2022-03-28 ENCOUNTER — Ambulatory Visit (INDEPENDENT_AMBULATORY_CARE_PROVIDER_SITE_OTHER): Payer: BC Managed Care – PPO | Admitting: Internal Medicine

## 2022-03-28 VITALS — BP 126/58 | HR 60 | Temp 97.8°F | Ht 70.0 in | Wt 197.4 lb

## 2022-03-28 DIAGNOSIS — E782 Mixed hyperlipidemia: Secondary | ICD-10-CM

## 2022-03-28 DIAGNOSIS — Z79899 Other long term (current) drug therapy: Secondary | ICD-10-CM

## 2022-03-28 DIAGNOSIS — J301 Allergic rhinitis due to pollen: Secondary | ICD-10-CM | POA: Diagnosis not present

## 2022-03-28 DIAGNOSIS — E041 Nontoxic single thyroid nodule: Secondary | ICD-10-CM

## 2022-03-28 DIAGNOSIS — Z122 Encounter for screening for malignant neoplasm of respiratory organs: Secondary | ICD-10-CM

## 2022-03-28 DIAGNOSIS — R7301 Impaired fasting glucose: Secondary | ICD-10-CM

## 2022-03-28 DIAGNOSIS — Z0001 Encounter for general adult medical examination with abnormal findings: Secondary | ICD-10-CM

## 2022-03-28 DIAGNOSIS — Z125 Encounter for screening for malignant neoplasm of prostate: Secondary | ICD-10-CM

## 2022-03-28 DIAGNOSIS — Z974 Presence of external hearing-aid: Secondary | ICD-10-CM

## 2022-03-28 DIAGNOSIS — R011 Cardiac murmur, unspecified: Secondary | ICD-10-CM

## 2022-03-28 DIAGNOSIS — Z0000 Encounter for general adult medical examination without abnormal findings: Secondary | ICD-10-CM | POA: Diagnosis not present

## 2022-03-28 LAB — COMPREHENSIVE METABOLIC PANEL
ALT: 14 U/L (ref 0–53)
AST: 19 U/L (ref 0–37)
Albumin: 4.4 g/dL (ref 3.5–5.2)
Alkaline Phosphatase: 69 U/L (ref 39–117)
BUN: 13 mg/dL (ref 6–23)
CO2: 28 mEq/L (ref 19–32)
Calcium: 8.9 mg/dL (ref 8.4–10.5)
Chloride: 104 mEq/L (ref 96–112)
Creatinine, Ser: 0.92 mg/dL (ref 0.40–1.50)
GFR: 89.04 mL/min (ref >60.00)
Glucose, Bld: 96 mg/dL (ref 70–99)
Potassium: 4.6 mEq/L (ref 3.5–5.1)
Sodium: 139 mEq/L (ref 135–145)
Total Bilirubin: 0.4 mg/dL (ref 0.2–1.2)
Total Protein: 6.8 g/dL (ref 6.0–8.3)

## 2022-03-28 LAB — LIPID PANEL
Cholesterol: 161 mg/dL (ref 0–200)
HDL: 44.7 mg/dL (ref >39.00)
LDL Cholesterol: 91 mg/dL (ref 0–99)
NonHDL: 115.81
Total CHOL/HDL Ratio: 4
Triglycerides: 122 mg/dL (ref 0.0–149.0)
VLDL: 24.4 mg/dL (ref 0.0–40.0)

## 2022-03-28 LAB — CBC WITH DIFFERENTIAL/PLATELET
Basophils Absolute: 0 10*3/uL (ref 0.0–0.1)
Basophils Relative: 0.4 % (ref 0.0–3.0)
Eosinophils Absolute: 0.1 10*3/uL (ref 0.0–0.7)
Eosinophils Relative: 1 % (ref 0.0–5.0)
HCT: 38.5 % — ABNORMAL LOW (ref 39.0–52.0)
Hemoglobin: 12.8 g/dL — ABNORMAL LOW (ref 13.0–17.0)
Lymphocytes Relative: 26.3 % (ref 12.0–46.0)
Lymphs Abs: 1.9 10*3/uL (ref 0.7–4.0)
MCHC: 33.2 g/dL (ref 30.0–36.0)
MCV: 80.7 fl (ref 78.0–100.0)
Monocytes Absolute: 0.5 10*3/uL (ref 0.1–1.0)
Monocytes Relative: 7.3 % (ref 3.0–12.0)
Neutro Abs: 4.8 10*3/uL (ref 1.4–7.7)
Neutrophils Relative %: 65 % (ref 43.0–77.0)
Platelets: 197 10*3/uL (ref 150.0–400.0)
RBC: 4.77 Mil/uL (ref 4.22–5.81)
RDW: 15.6 % — ABNORMAL HIGH (ref 11.5–15.5)
WBC: 7.4 10*3/uL (ref 4.0–10.5)

## 2022-03-28 LAB — LDL CHOLESTEROL, DIRECT: Direct LDL: 104 mg/dL

## 2022-03-28 LAB — HEMOGLOBIN A1C: Hgb A1c MFr Bld: 5.9 % (ref 4.6–6.5)

## 2022-03-28 MED ORDER — TAMSULOSIN HCL 0.4 MG PO CAPS: 0.4000 mg | ORAL_CAPSULE | Freq: Every day | ORAL | 3 refills | Status: DC

## 2022-03-28 MED ORDER — MONTELUKAST SODIUM 10 MG PO TABS: 10.0000 mg | ORAL_TABLET | Freq: Every day | ORAL | 3 refills | Status: DC

## 2022-03-28 MED ORDER — CYANOCOBALAMIN 1000 MCG/ML IJ SOLN
INTRAMUSCULAR | 0 refills | Status: DC
Start: 2022-03-28 — End: 2022-07-22

## 2022-03-28 NOTE — Patient Instructions (Addendum)
Daily use of a probiotic advised for 3 weeks.     I AM REFERRING YOU  TO   1) DR CALLWOOD FOR EVALUATION OF HEART MURMUR  2)  PULMONARY CLINIC FOR LUNG CANCER SCREENING (ANNUAL CT SCAN)   Your referrals are in process as discussed.  If you are not contacted on either one after 2 weeks , PLEASE LET ME KNOW.

## 2022-03-28 NOTE — Progress Notes (Unsigned)
Patient ID: Alexander Little., male    DOB: October 01, 1960  Age: 62 y.o. MRN: [id number]  The patient is here for annual preventive examination and management of other chronic and acute problems.   The risk factors are reflected in the social history.   The roster of all physicians providing medical care to patient - is listed in the Snapshot section of the chart.   Activities of daily living:  The patient is 100% independent in all ADLs: dressing, toileting, feeding as well as independent mobility   Home safety : The patient has smoke detectors in the home. They wear seatbelts.  There are no unsecured firearms at home. There is no violence in the home.    There is no risks for hepatitis, STDs or HIV. There is no   history of blood transfusion. They have no travel history to infectious disease endemic areas of the world.   The patient has seen their dentist in the last six month. They have seen their eye doctor in the last year. The patinet  denies slight hearing difficulty with regard to whispered voices and some television programs.  They have deferred audiologic testing in the last year.  They do not  have excessive sun exposure. Discussed the need for sun protection: hats, long sleeves and use of sunscreen if there is significant sun exposure.    Diet: the importance of a healthy diet is discussed. They do have a healthy diet.   The benefits of regular aerobic exercise were discussed. The patient  exercises  3 to 5 days per week  for  60 minutes.    Depression screen: there are no signs or vegative symptoms of depression- irritability, change in appetite, anhedonia, sadness/tearfullness.   The following portions of the patient's history were reviewed and updated as appropriate: allergies, current medications, past family history, past medical history,  past surgical history, past social history  and problem list.   Visual acuity was not assessed per patient preference since the patient has  regular follow up with an  ophthalmologist. Hearing and body mass index were assessed and reviewed.    During the course of the visit the patient was educated and counseled about appropriate screening and preventive services including : fall prevention , diabetes screening, nutrition counseling, colorectal cancer screening, and recommended immunizations.    Chief Complaint:  1) recovering from a upper/lower respiratory infection that was treated by E visit with doxycycline on Friday due to prolonged symptoms of sinus congestion, productive cough,  chest tightness and dyspnea with exertion   . 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  Using flonase, zyrtec and singulair   Review of Symptoms  Patient denies headache, fevers, malaise, unintentional weight loss, skin rash, eye pain, sore throat, dysphagia,  hemoptysis ,  palpitations, orthopnea, edema, abdominal pain, nausea, melena, diarrhea, constipation, flank pain, dysuria, hematuria, urinary  Frequency, nocturia, numbness, tingling, seizures,  Focal weakness, Loss of consciousness,  Tremor, insomnia, depression, anxiety, and suicidal ideation.    Physical Exam:  BP (!) 126/58   Pulse 60   Temp 97.8 F (36.6 C) (Oral)   Ht 5\' 10"  (1.778 m)   Wt 197 lb 6.4 oz (89.5 kg)   SpO2 94%   BMI 28.32 kg/m    Physical Exam Vitals reviewed.  Constitutional:  General: He is not in acute distress.    Appearance: Normal appearance. He is normal weight. He is not ill-appearing, toxic-appearing or diaphoretic.  HENT:     Head: Normocephalic and atraumatic.     Right Ear: Tympanic membrane, ear canal and external ear normal. There is no impacted cerumen.     Left Ear: Tympanic membrane, ear canal and external ear normal. There is no impacted cerumen.     Nose: Nose normal.     Mouth/Throat:      Mouth: Mucous membranes are moist.     Pharynx: Oropharynx is clear.  Eyes:     General: No scleral icterus.       Right eye: No discharge.        Left eye: No discharge.     Conjunctiva/sclera: Conjunctivae normal.  Neck:     Thyroid: No thyromegaly.     Vascular: No carotid bruit or JVD.  Cardiovascular:     Rate and Rhythm: Normal rate and regular rhythm.     Pulses: Normal pulses.     Heart sounds: Murmur heard.     Decrescendo systolic murmur is present with a grade of 2/6.     Comments: LOUDEST  AT LEFT LOWER STERNAL BORDER  RADIATES TO LEFT CAROTID  Pulmonary:     Effort: Pulmonary effort is normal. No respiratory distress.     Breath sounds: Normal breath sounds.  Abdominal:     General: Bowel sounds are normal.     Palpations: Abdomen is soft. There is no mass.     Tenderness: There is no abdominal tenderness. There is no guarding or rebound.  Musculoskeletal:        General: Normal range of motion.     Cervical back: Normal range of motion and neck supple.  Lymphadenopathy:     Cervical: No cervical adenopathy.  Skin:    General: Skin is warm and dry.  Neurological:     General: No focal deficit present.     Mental Status: He is alert and oriented to person, place, and time. Mental status is at baseline.  Psychiatric:        Mood and Affect: Mood normal.        Behavior: Behavior normal.        Thought Content: Thought content normal.        Judgment: Judgment normal.   Assessment and Plan: Mixed hyperlipidemia Assessment & Plan: Controlled with simvastatin 40 mg daily for elevated 10 yr risk of  CAD based on Framingham calculation.  LDL is < 100. Triglycerides have improved with weight loss. .  Lab Results  Component Value Date   CHOL 161 03/28/2022   HDL 44.70 03/28/2022   LDLCALC 91 03/28/2022   LDLDIRECT 104.0 03/28/2022   TRIG 122.0 03/28/2022   CHOLHDL 4 03/28/2022   Lab Results  Component Value Date   ALT 14 03/28/2022   AST 19 03/28/2022    ALKPHOS 69 03/28/2022   BILITOT 0.4 03/28/2022      Orders: -     Lipid panel -     LDL cholesterol, direct -     Comprehensive metabolic panel; Future -     Lipid panel; Future  Non-seasonal allergic rhinitis due to pollen -     Montelukast Sodium; Take 1 tablet (10 mg total) by mouth at bedtime.  Dispense: 90 tablet; Refill: 3  Encounter for preventive health examination Assessment & Plan: age appropriate education and counseling updated, referrals for preventative services  and immunizations addressed, dietary and smoking counseling addressed, most recent labs reviewed.  I have personally reviewed and have noted:   1) the patient's medical and social history 2) The pt's use of alcohol, tobacco, and illicit drugs 3) The patient's current medications and supplements 4) Functional ability including ADL's, fall risk, home safety risk, hearing and visual impairment 5) Diet and physical activities 6) Evidence for depression or mood disorder 7) The patient's height, weight, and BMI have been recorded in the chart   I have made referrals, and provided counseling and education based on review of the above    Prostate cancer screening -     PSA  Long-term use of high-risk medication -     TSH -     CBC with Differential/Platelet  Impaired fasting glucose -     Comprehensive metabolic panel -     Hemoglobin A1c  Uses hearing aid Assessment & Plan: Bilateral,  obtained in Jan 2024 after failing hearing test on his DOT physical. .  His OOP cost was $2500 (insurance paid the other $2500)    Seasonal allergic rhinitis due to pollen Assessment & Plan: [place]   Encounter for general adult medical examination with abnormal findings  Systolic murmur -     Ambulatory referral to Cardiology  Encounter for screening for lung cancer -     Ambulatory referral to Pulmonology  Thyroid nodule Assessment & Plan: He had a non diagnostic thyroid biopsy   in March 2015 at [facility], and  repeat  biopsy was recommended in 6 months but deferred. By patient.  He has not had repeat imaging.  .  Thyroid function is normal.    Lab Results  Component Value Date   TSH 3.69 03/28/2022      Other orders -     Cyanocobalamin; Inject 1ML into the skin intramuscularly every 14 days.  Dispense: 10 mL; Refill: 0 -     Tamsulosin HCl; Take 1 capsule (0.4 mg total) by mouth daily.  Dispense: 90 capsule; Refill: 3    No follow-ups on file.  [name], MD

## 2022-03-28 NOTE — Assessment & Plan Note (Signed)
[place] AND SINGULAIR

## 2022-03-28 NOTE — Assessment & Plan Note (Signed)
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 .  His OOP cost was $2500 (insurance paid the other $2500)

## 2022-03-29 ENCOUNTER — Encounter: Payer: Self-pay | Admitting: Internal Medicine

## 2022-03-29 LAB — PSA: PSA: 0.34 ng/mL (ref 0.10–4.00)

## 2022-03-29 LAB — TSH: TSH: 3.69 u[IU]/mL (ref 0.35–5.50)

## 2022-03-29 NOTE — Assessment & Plan Note (Signed)

## 2022-03-29 NOTE — Assessment & Plan Note (Signed)
Controlled with simvastatin 40 mg daily for elevated 10 yr risk of  CAD based on Framingham calculation.  LDL is < 100. Triglycerides have improved with weight loss. .  Lab Results  Component Value Date   CHOL 161 03/28/2022   HDL 44.70 03/28/2022   LDLCALC 91 03/28/2022   LDLDIRECT 104.0 03/28/2022   TRIG 122.0 03/28/2022   CHOLHDL 4 03/28/2022   Lab Results  Component Value Date   ALT 14 03/28/2022   AST 19 03/28/2022   ALKPHOS 69 03/28/2022   BILITOT 0.4 03/28/2022

## 2022-03-29 NOTE — Assessment & Plan Note (Signed)
He had a non diagnostic thyroid biopsy  in March 2015 at [facility], and  repeat  biopsy was recommended in 6 months but deferred. By patient.  He has not had repeat imaging.  .  Thyroid function is normal.    Lab Results  Component Value Date   TSH 3.69 03/28/2022

## 2022-04-07 ENCOUNTER — Encounter: Payer: Self-pay | Admitting: Internal Medicine

## 2022-04-19 DIAGNOSIS — R0789 Other chest pain: Secondary | ICD-10-CM | POA: Diagnosis not present

## 2022-04-19 DIAGNOSIS — R011 Cardiac murmur, unspecified: Secondary | ICD-10-CM | POA: Diagnosis not present

## 2022-04-19 DIAGNOSIS — E782 Mixed hyperlipidemia: Secondary | ICD-10-CM | POA: Diagnosis not present

## 2022-04-19 DIAGNOSIS — Z7689 Persons encountering health services in other specified circumstances: Secondary | ICD-10-CM | POA: Diagnosis not present

## 2022-05-02 DIAGNOSIS — R011 Cardiac murmur, unspecified: Secondary | ICD-10-CM | POA: Diagnosis not present

## 2022-05-09 ENCOUNTER — Encounter: Payer: Self-pay | Admitting: Internal Medicine

## 2022-05-10 NOTE — Telephone Encounter (Signed)
Pt hs not heard about getting his CT lung screening scheduled.

## 2022-05-12 NOTE — Telephone Encounter (Signed)
noted 

## 2022-05-17 ENCOUNTER — Encounter: Payer: Self-pay | Admitting: Internal Medicine

## 2022-05-18 NOTE — Telephone Encounter (Signed)
Pt completed Echo on 05/02/2022. Report is in Care Everywhere.

## 2022-06-06 DIAGNOSIS — I350 Nonrheumatic aortic (valve) stenosis: Secondary | ICD-10-CM | POA: Diagnosis not present

## 2022-06-06 DIAGNOSIS — R011 Cardiac murmur, unspecified: Secondary | ICD-10-CM | POA: Diagnosis not present

## 2022-06-06 DIAGNOSIS — Z87891 Personal history of nicotine dependence: Secondary | ICD-10-CM | POA: Diagnosis not present

## 2022-06-06 DIAGNOSIS — I351 Nonrheumatic aortic (valve) insufficiency: Secondary | ICD-10-CM | POA: Diagnosis not present

## 2022-07-21 ENCOUNTER — Other Ambulatory Visit: Payer: Self-pay | Admitting: Internal Medicine

## 2022-09-25 ENCOUNTER — Encounter: Payer: Self-pay | Admitting: Internal Medicine

## 2022-09-26 MED ORDER — VARENICLINE TARTRATE (STARTER) 0.5 MG X 11 & 1 MG X 42 PO TBPK
ORAL_TABLET | ORAL | 0 refills | Status: DC
Start: 2022-09-26 — End: 2023-02-07

## 2022-09-26 MED ORDER — VARENICLINE TARTRATE 1 MG PO TABS: 1.0000 mg | ORAL_TABLET | Freq: Two times a day (BID) | ORAL | 3 refills | Status: DC

## 2022-09-28 ENCOUNTER — Other Ambulatory Visit (INDEPENDENT_AMBULATORY_CARE_PROVIDER_SITE_OTHER): Payer: BC Managed Care – PPO

## 2022-09-28 DIAGNOSIS — E782 Mixed hyperlipidemia: Secondary | ICD-10-CM | POA: Diagnosis not present

## 2022-09-28 LAB — LIPID PANEL
Cholesterol: 187 mg/dL (ref 0–200)
HDL: 51.7 mg/dL (ref >39.00)
LDL Cholesterol: 102 mg/dL — ABNORMAL HIGH (ref 0–99)
NonHDL: 135.17
Total CHOL/HDL Ratio: 4
Triglycerides: 165 mg/dL — ABNORMAL HIGH (ref 0.0–149.0)
VLDL: 33 mg/dL (ref 0.0–40.0)

## 2022-09-28 LAB — COMPREHENSIVE METABOLIC PANEL
ALT: 16 U/L (ref 0–53)
AST: 19 U/L (ref 0–37)
Albumin: 4.3 g/dL (ref 3.5–5.2)
Alkaline Phosphatase: 64 U/L (ref 39–117)
BUN: 11 mg/dL (ref 6–23)
CO2: 27 mEq/L (ref 19–32)
Calcium: 9.2 mg/dL (ref 8.4–10.5)
Chloride: 104 mEq/L (ref 96–112)
Creatinine, Ser: 0.88 mg/dL (ref 0.40–1.50)
GFR: 91.72 mL/min (ref >60.00)
Glucose, Bld: 94 mg/dL (ref 70–99)
Potassium: 4.3 mEq/L (ref 3.5–5.1)
Sodium: 139 mEq/L (ref 135–145)
Total Bilirubin: 0.5 mg/dL (ref 0.2–1.2)
Total Protein: 6.9 g/dL (ref 6.0–8.3)

## 2022-10-18 ENCOUNTER — Other Ambulatory Visit: Payer: BC Managed Care – PPO

## 2022-10-18 ENCOUNTER — Encounter: Payer: Self-pay | Admitting: Internal Medicine

## 2022-10-18 ENCOUNTER — Telehealth: Payer: Self-pay

## 2022-10-18 ENCOUNTER — Other Ambulatory Visit: Payer: Self-pay | Admitting: Internal Medicine

## 2022-10-18 DIAGNOSIS — M545 Low back pain, unspecified: Secondary | ICD-10-CM

## 2022-10-18 NOTE — Telephone Encounter (Signed)
See telephone encounter.

## 2022-10-18 NOTE — Telephone Encounter (Signed)
Pt has been scheduled for a virtual visit tomorrow at 5 pm. Does pt need to come in today for any kind of testing?    Alexander Little.  P [facility] (supporting Sherlene Shams, MD)29 minutes ago (8:31 AM)    If need be I can come in to see Dr Darrick Huntsman. Really painful     Alexander Little.  P [facility] Pool (supporting Sherlene Shams, MD)1 hour ago (7:48 AM)    Yes my lower left back has been hurting for a couple days now. Getting worse. I'm urinating fine, no pain. But the back pain is mostly the left side, once in awhile it feels like the right, but mostly the left side.

## 2022-10-18 NOTE — Telephone Encounter (Signed)
Pt has been scheduled for a lab appt today at 3 pm.

## 2022-10-19 ENCOUNTER — Ambulatory Visit (INDEPENDENT_AMBULATORY_CARE_PROVIDER_SITE_OTHER): Payer: BC Managed Care – PPO | Admitting: Internal Medicine

## 2022-10-19 ENCOUNTER — Encounter: Payer: Self-pay | Admitting: Internal Medicine

## 2022-10-19 VITALS — BP 120/58 | HR 57 | Ht 70.0 in | Wt 193.2 lb

## 2022-10-19 DIAGNOSIS — M5126 Other intervertebral disc displacement, lumbar region: Secondary | ICD-10-CM | POA: Insufficient documentation

## 2022-10-19 MED ORDER — PREDNISONE 10 MG PO TABS: ORAL_TABLET | ORAL | 0 refills | Status: DC

## 2022-10-19 MED ORDER — METHYLPREDNISOLONE ACETATE 80 MG/ML IJ SUSP
80.0000 mg | Freq: Once | INTRAMUSCULAR | Status: DC
Start: 2022-10-19 — End: 2022-10-19

## 2022-10-19 MED ORDER — HYDROCODONE-ACETAMINOPHEN 10-325 MG PO TABS
1.0000 | ORAL_TABLET | Freq: Four times a day (QID) | ORAL | 0 refills | Status: AC | PRN
Start: 2022-10-19 — End: 2022-10-26

## 2022-10-19 MED ORDER — METHYLPREDNISOLONE ACETATE 40 MG/ML IJ SUSP
40.0000 mg | Freq: Once | INTRAMUSCULAR | Status: AC
Start: 2022-10-19 — End: 2022-10-19
  Administered 2022-10-19: 40 mg via INTRAMUSCULAR

## 2022-10-19 MED ORDER — TIZANIDINE HCL 4 MG PO TABS: 4.0000 mg | ORAL_TABLET | Freq: Four times a day (QID) | ORAL | 0 refills | Status: DC | PRN

## 2022-10-19 NOTE — Patient Instructions (Signed)
Save the prednisone taper and start it in a few days if the depo medrol starts to wear off  Lumbar spine films at [facility] tomorrow  Letter for work : stay out for the next 4-5 days (return when you no longer need to use the vicodin)  Vicodin and tizanidine every 6 hours as needed   Exercise: (supported back extension, standing)  Stand against a counter or sofa with your buttocks resting on the edge and your hands on the edge as well on either side of your back  Slowly lean backwards,  Bending from the waist, until you feel slight discomfort. Restore yourself to vertical position (up straight) Repeat the back extension 10 times , each time extending a little farther).  What should you expect? The pain should recede from the calf/thigh/buttocks but may localize to the lower back  If it does not,  Or if it makes the leg pain worse, STOP doing it.   If it results in improvement,  Repeat the exercise every 2-3 hours while awake and STOP THE OTHER EXERCISES that do the opposite motion (back flexion)

## 2022-10-19 NOTE — Assessment & Plan Note (Addendum)
With muscle spasm and limited ROM  .   H/o prior MRI with epidural injections in 2013.  Will treat with steroids,  M<  and vicodin.  Remain out of work until he is no requiring narcotics plain films ordered

## 2022-10-19 NOTE — Progress Notes (Signed)
Subjective:  Patient ID: Alexander Little., male    DOB: 12-Nov-1960  Age: 62 y.o. 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   HPI Alexander Little. presents for  Chief Complaint  Patient presents with   Back Pain    Lower back pain that started 2 days ago   62 YR OLD MALE PRESENTS WITH ACUTE ONSET OF LOW BACK PaIN that started ON MONDAY EVENING.  CLEANED THE GUTTERS ON SATURDAY AT HOME for several hours   .  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     cetirizine (ZYRTEC) 10 MG tablet Take 10 mg by mouth daily.     Coenzyme Q10 (COQ10) 100 MG CAPS Take 100 mg by mouth daily.      cyanocobalamin (VITAMIN B12) 1000 MCG/ML injection INJECT INTO THE MUSCLE EVERY 14 DAYSAS DIRECTED. 10 mL 1   montelukast (SINGULAIR) 10 MG tablet Take 1 tablet (10 mg total) by mouth at bedtime. 90 tablet 3   simvastatin (ZOCOR) 40 MG tablet TAKE ONE TABLET (40 MG) BY MOUTH EVERY EVENING 90 tablet 3   SYRINGE-NEEDLE, DISP, 3 ML (B-D 3CC LUER-LOK SYR 25GX1") 25G X 1" 3 ML MISC USE AS DIRECTED 6 each 2   SYRINGE-NEEDLE, DISP, 3 ML (B-D 3CC LUER-LOK SYR 25GX5/8") 25G X 5/8" 3 ML MISC USE AS DIRECTED 6 each 2   tamsulosin (FLOMAX) 0.4 MG CAPS capsule Take 1 capsule (0.4 mg total) by mouth daily. 90 capsule 3   [START ON 10/27/2022] varenicline (CHANTIX CONTINUING MONTH PAK) 1 MG tablet Take 1 tablet (1 mg total) by mouth 2 (two) times daily. 60 tablet 3   Varenicline Tartrate, Starter, (CHANTIX STARTING MONTH PAK) 0.5 MG X 11 & 1 MG X 42 TBPK Increase dose gradually as directed 53 each 0   tiZANidine (ZANAFLEX) 4 MG tablet Take 1 tablet (4 mg total) by mouth  every 6 (six) hours as needed for muscle spasms. 30 tablet 0   No facility-administered medications prior to visit.    Review of Systems;  Patient denies headache, fevers, malaise, unintentional weight loss, skin rash, eye pain, sinus congestion and sinus pain, sore throat, dysphagia,  hemoptysis , cough, dyspnea, wheezing, chest pain, palpitations, orthopnea, edema, abdominal pain, nausea, melena, diarrhea, constipation, flank pain, dysuria, hematuria, urinary  Frequency, nocturia, numbness, tingling, seizures,  Focal weakness, Loss of consciousness,  Tremor, insomnia, depression, anxiety, and suicidal ideation.      Objective:  BP (!) 120/58   Pulse (!) 57   Ht 5\' 10"  (1.778 m)   Wt 193 lb 3.2 oz (87.6 kg)   SpO2 97%   BMI 27.72 kg/m   BP Readings from Last 3 Encounters:  10/19/22 (!) 120/58  03/28/22 (!) 126/58  07/07/21 112/68    Wt Readings from Last 3 Encounters:  10/19/22 193 lb 3.2 oz (87.6 kg)  03/28/22 197 lb 6.4 oz (89.5 kg)  07/07/21 193 lb (87.5 kg)    Physical Exam Vitals reviewed.  Constitutional:      General: He is not in  acute distress.    Appearance: Normal appearance. He is normal weight. He is not ill-appearing, toxic-appearing or diaphoretic.  HENT:     Head: Normocephalic.  Eyes:     General: No scleral icterus.       Right eye: No discharge.        Left eye: No discharge.     Conjunctiva/sclera: Conjunctivae normal.  Cardiovascular:     Rate and Rhythm: Normal rate and regular rhythm.     Heart sounds: Normal heart sounds.  Pulmonary:     Effort: Pulmonary effort is normal. No respiratory distress.     Breath sounds: Normal breath sounds.  Musculoskeletal:        General: Normal range of motion.     Cervical back: Normal range of motion.  Skin:    General: Skin is warm and dry.  Neurological:     General: No focal deficit present.     Mental Status: He is alert and oriented to person, place, and time. Mental status is at baseline.   Psychiatric:        Mood and Affect: Mood normal.        Behavior: Behavior normal.        Thought Content: Thought content normal.        Judgment: Judgment normal.    Lab Results  Component Value Date   HGBA1C 5.9 03/28/2022   HGBA1C 5.9 03/24/2021    Lab Results  Component Value Date   CREATININE 0.88 09/28/2022   CREATININE 0.92 03/28/2022   CREATININE 0.88 07/07/2021    Lab Results  Component Value Date   WBC 7.4 03/28/2022   HGB 12.8 (L) 03/28/2022   HCT 38.5 (L) 03/28/2022   PLT 197.0 03/28/2022   GLUCOSE 94 09/28/2022   CHOL 187 09/28/2022   TRIG 165.0 (H) 09/28/2022   HDL 51.70 09/28/2022   LDLDIRECT 104.0 03/28/2022   LDLCALC 102 (H) 09/28/2022   ALT 16 09/28/2022   AST 19 09/28/2022   NA 139 09/28/2022   K 4.3 09/28/2022   CL 104 09/28/2022   CREATININE 0.88 09/28/2022   BUN 11 09/28/2022   CO2 27 09/28/2022   TSH 3.69 03/28/2022   PSA 0.34 03/28/2022   INR 1.10 12/01/2015   HGBA1C 5.9 03/28/2022    DG Chest 2 View  Result Date: 07/07/2021 CLINICAL DATA:  Left chest pain and left arm pain for 1 hour. EXAM: CHEST - 2 VIEW COMPARISON:  11/30/2015 and CT scan of 12/01/2015 FINDINGS: Atherosclerotic calcification of the aortic arch. Heart size within normal limits. Small chronic bulla at the right lung apex. Large lung volumes. No blunting of the costophrenic angle. Mild midthoracic spondylosis. On the lateral projection, there is a 2.6 by 1.3 cm bandlike density projecting over the costophrenic angles. This could be from retro diaphragmatic scarring or atelectasis but a true pulmonary nodule is not excluded. IMPRESSION: 1. Bandlike density projecting over the posterior costophrenic angles on the lateral projection, not present on 11/30/2015. This could be from atelectasis but a true pulmonary nodule is not excluded. Consider either chest CT now, or follow up two-view chest radiography in 6 weeks time in order to ensure clearance. 2.  Aortic Atherosclerosis  (ICD10-I70.0). Electronically Signed   By: Gaylyn Rong M.D.   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   H/o prior MRI with epidural injections in  2013.  Will treat with steroids,  M<  and vicodin.  Remain out of work until he is no requiring narcotics plain films ordered  Orders: -     DG Lumbar Spine Complete; Future -     methylPREDNISolone Acetate  Other orders -     tiZANidine HCl; Take 1 tablet (4 mg total) by mouth every 6 (six) hours as needed for muscle spasms.  Dispense: 30 tablet; Refill: 0 -     HYDROcodone-Acetaminophen; Take 1-2 tablets by mouth every 6 (six) hours as needed for up to 7 days.  Dispense: 56 tablet; Refill: 0 -     predniSONE; 6 tablets on Day 1 , then reduce by 1 tablet daily until gone  Dispense: 21 tablet; Refill: 0     I provided 30 minutes of face-to-face time during this encounter reviewing patient's last visit with me, patient's  most recent visit with cardiology,  nephrology,  and neurology,  recent surgical and non surgical procedures, previous  labs and imaging studies, counseling on currently addressed issues,  and post visit ordering to diagnostics and therapeutics .   Follow-up: No follow-ups on file.   Sherlene Shams, MD

## 2022-10-20 ENCOUNTER — Ambulatory Visit
Admission: RE | Admit: 2022-10-20 | Discharge: 2022-10-20 | Disposition: A | Discharge status: Home / Self Care | Payer: BC Managed Care – PPO | Source: Ambulatory Visit | Attending: Internal Medicine | Admitting: Internal Medicine

## 2022-10-20 DIAGNOSIS — M5126 Other intervertebral disc displacement, lumbar region: Secondary | ICD-10-CM | POA: Diagnosis not present

## 2022-10-20 DIAGNOSIS — M47816 Spondylosis without myelopathy or radiculopathy, lumbar region: Secondary | ICD-10-CM | POA: Diagnosis not present

## 2022-10-20 DIAGNOSIS — M545 Low back pain, unspecified: Secondary | ICD-10-CM | POA: Diagnosis not present

## 2022-10-21 ENCOUNTER — Encounter: Payer: Self-pay | Admitting: Internal Medicine

## 2022-10-24 ENCOUNTER — Encounter: Payer: Self-pay | Admitting: Internal Medicine

## 2023-01-24 ENCOUNTER — Other Ambulatory Visit: Payer: Self-pay | Admitting: Internal Medicine

## 2023-01-24 DIAGNOSIS — J301 Allergic rhinitis due to pollen: Secondary | ICD-10-CM

## 2023-02-07 ENCOUNTER — Other Ambulatory Visit: Payer: Self-pay | Admitting: Internal Medicine

## 2023-02-07 NOTE — Telephone Encounter (Signed)
Refilled: 10/27/2022 Last OV: 10/19/2022 Next OV: not scheduled

## 2023-02-23 ENCOUNTER — Encounter: Payer: Self-pay | Admitting: Internal Medicine

## 2023-03-23 ENCOUNTER — Encounter: Payer: Self-pay | Admitting: Internal Medicine

## 2023-03-24 NOTE — Telephone Encounter (Signed)
 Appt scheduled for 04/24/23

## 2023-04-24 ENCOUNTER — Other Ambulatory Visit: Payer: Self-pay | Admitting: Internal Medicine

## 2023-04-24 ENCOUNTER — Encounter: Payer: Self-pay | Admitting: Internal Medicine

## 2023-04-24 ENCOUNTER — Ambulatory Visit (INDEPENDENT_AMBULATORY_CARE_PROVIDER_SITE_OTHER): Admitting: Internal Medicine

## 2023-04-24 VITALS — BP 126/58 | HR 65 | Ht 70.0 in | Wt 202.8 lb

## 2023-04-24 DIAGNOSIS — J301 Allergic rhinitis due to pollen: Secondary | ICD-10-CM

## 2023-04-24 DIAGNOSIS — Z0000 Encounter for general adult medical examination without abnormal findings: Secondary | ICD-10-CM | POA: Diagnosis not present

## 2023-04-24 DIAGNOSIS — E663 Overweight: Secondary | ICD-10-CM

## 2023-04-24 DIAGNOSIS — Z125 Encounter for screening for malignant neoplasm of prostate: Secondary | ICD-10-CM

## 2023-04-24 DIAGNOSIS — E041 Nontoxic single thyroid nodule: Secondary | ICD-10-CM | POA: Diagnosis not present

## 2023-04-24 DIAGNOSIS — R7301 Impaired fasting glucose: Secondary | ICD-10-CM

## 2023-04-24 DIAGNOSIS — D513 Other dietary vitamin B12 deficiency anemia: Secondary | ICD-10-CM

## 2023-04-24 DIAGNOSIS — E782 Mixed hyperlipidemia: Secondary | ICD-10-CM

## 2023-04-24 DIAGNOSIS — D508 Other iron deficiency anemias: Secondary | ICD-10-CM

## 2023-04-24 DIAGNOSIS — E538 Deficiency of other specified B group vitamins: Secondary | ICD-10-CM

## 2023-04-24 DIAGNOSIS — M5387 Other specified dorsopathies, lumbosacral region: Secondary | ICD-10-CM

## 2023-04-24 DIAGNOSIS — Z87891 Personal history of nicotine dependence: Secondary | ICD-10-CM

## 2023-04-24 DIAGNOSIS — Z974 Presence of external hearing-aid: Secondary | ICD-10-CM

## 2023-04-24 MED ORDER — MONTELUKAST SODIUM 10 MG PO TABS: 10.0000 mg | ORAL_TABLET | Freq: Every day | ORAL | 0 refills | Status: AC

## 2023-04-24 MED ORDER — VARENICLINE TARTRATE 1 MG PO TABS: 1.0000 mg | ORAL_TABLET | Freq: Two times a day (BID) | ORAL | 5 refills | Status: DC

## 2023-04-24 MED ORDER — CYANOCOBALAMIN 1000 MCG/ML IJ SOLN: INTRAMUSCULAR | 1 refills | Status: DC

## 2023-04-24 MED ORDER — VARENICLINE TARTRATE 1 MG PO TABS: 1.0000 mg | ORAL_TABLET | Freq: Two times a day (BID) | ORAL | 0 refills | Status: DC

## 2023-04-24 MED ORDER — TAMSULOSIN HCL 0.4 MG PO CAPS: 0.4000 mg | ORAL_CAPSULE | Freq: Every day | ORAL | 3 refills | Status: AC

## 2023-04-24 NOTE — Assessment & Plan Note (Signed)
 IMPROVED /RESOLVED

## 2023-04-24 NOTE — Patient Instructions (Addendum)
 OK TO INCREASE  ZYRTEC  TO TWICE DAILY IF NEEDED

## 2023-04-24 NOTE — Progress Notes (Signed)
 Patient ID: Alexander Rodkey., male    DOB: 28-Apr-1960  Age: 63 y.o. MRN: [id number]  The patient is here for annual preventive examination and management of other chronic and acute problems.   The risk factors are reflected in the social history.   The roster of all physicians providing medical care to patient - is listed in the Snapshot section of the chart.   Activities of daily living:  The patient is 100% independent in all ADLs: dressing, toileting, feeding as well as independent mobility   Home safety : The patient has smoke detectors in the home. They wear seatbelts.  There are no unsecured firearms at home. There is no violence in the home.    There is no risks for hepatitis, STDs or HIV. There is no   history of blood transfusion. They have no travel history to infectious disease endemic areas of the world.   The patient has seen their dentist in the last six month. They have seen their eye doctor in the last year. The patinet  denies slight hearing difficulty with regard to whispered voices and some television programs.  They have deferred audiologic testing in the last year.  They do not  have excessive sun exposure. Discussed the need for sun protection: hats, long sleeves and use of sunscreen if there is significant sun exposure.    Diet: the importance of a healthy diet is discussed. They do have a healthy diet.   The benefits of regular aerobic exercise were discussed. The patient  exercises  3 to 5 days per week  for  60 minutes.    Depression screen: there are no signs or vegative symptoms of depression- irritability, change in appetite, anhedonia, sadness/tearfullness.   The following portions of the patient's history were reviewed and updated as appropriate: allergies, current medications, past family history, past medical history,  past surgical history, past social history  and problem list.   Visual acuity was not assessed per patient preference since the patient has  regular follow up with an  ophthalmologist. Hearing and body mass index were assessed and reviewed.    During the course of the visit the patient was educated and counseled about appropriate screening and preventive services including : fall prevention , diabetes screening, nutrition counseling, colorectal cancer screening, and recommended immunizations.    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  HAS REDUCED TRAVEL TIME FROM 4 HOURS TO 2 HOURS   BACK PAIN :  IMPROVED  with time , no longer radiating to either leg   ALLERGIC RHINITIS    Review of Symptoms  Patient denies headache, fevers, malaise, unintentional weight loss, skin rash, eye pain, sinus congestion and sinus pain, sore throat, dysphagia,  hemoptysis , cough, dyspnea, wheezing, chest pain, palpitations, orthopnea, edema, abdominal pain, nausea, melena, diarrhea, constipation, flank pain, dysuria, hematuria, urinary  Frequency, nocturia, numbness, tingling, seizures,  Focal weakness, Loss of consciousness,  Tremor, insomnia, depression, anxiety, and suicidal ideation.    Physical Exam:  BP (!) 126/58   Pulse 65   Ht 5\' 10"  (1.778 m)   Wt 202 lb 12.8 oz (92 kg)   SpO2 99%   BMI 29.10 kg/m    Physical Exam Vitals reviewed.  Constitutional:      General: He is not in acute distress.    Appearance: Normal appearance. He is normal weight. He is not ill-appearing, toxic-appearing or diaphoretic.  HENT:     Head: Normocephalic and atraumatic.     Right Ear: Tympanic membrane, ear canal and external ear normal. There is no impacted cerumen.     Left Ear: Tympanic membrane, ear canal and external ear normal. There is no impacted cerumen.     Nose: Nose normal.     Mouth/Throat:     Mouth: Mucous membranes are moist.     Pharynx: Oropharynx is clear.  Eyes:     General: No scleral icterus.       Right eye: No discharge.        Left eye: No discharge.     Conjunctiva/sclera:  Conjunctivae normal.  Neck:     Thyroid : No thyromegaly.     Vascular: No carotid bruit or JVD.  Cardiovascular:     Rate and Rhythm: Normal rate and regular rhythm.     Heart sounds: Normal heart sounds.  Pulmonary:     Effort: Pulmonary effort is normal. No respiratory distress.     Breath sounds: Normal breath sounds.  Abdominal:     General: Bowel sounds are normal.     Palpations: Abdomen is soft. There is no mass.     Tenderness: There is no abdominal tenderness. There is no guarding or rebound.  Musculoskeletal:        General: Normal range of motion.     Cervical back: Normal range of motion and neck supple.  Lymphadenopathy:     Cervical: No cervical adenopathy.  Skin:    General: Skin is warm and dry.  Neurological:     General: No focal deficit present.     Mental Status: He is alert and oriented to person, place, and time. Mental status is at baseline.  Psychiatric:        Mood and Affect: Mood normal.        Behavior: Behavior normal.        Thought Content: Thought content normal.        Judgment: Judgment normal.    Assessment and Plan: Non-seasonal allergic rhinitis due to pollen -     Montelukast  Sodium; Take 1 tablet (10 mg total) by mouth at bedtime.  Dispense: 90 tablet; Refill: 0  Mixed hyperlipidemia -     Lipid panel -     LDL cholesterol, direct  Overweight (BMI 25.0-29.9) -     CBC with Differential/Platelet -     TSH  Prostate cancer screening -     PSA  Impaired fasting glucose -     Comprehensive metabolic panel with GFR -     Hemoglobin A1c  Uses hearing aid  Sciatica associated with disorder of lumbosacral spine Assessment & Plan: IMPROVED /RESOLVED    History of tobacco abuse Assessment & Plan: QUIT 3 MONTHS AGO WITH [facility]     Thyroid  nodule Assessment & Plan: He continues to decline follow up imaging or biopsy based on review today    Encounter for preventive health examination Assessment & Plan: age appropriate  education and counseling updated, referrals for preventative services and immunizations addressed, dietary and smoking counseling addressed, most recent labs reviewed.  I have personally reviewed and have noted:   1) the patient's medical and social history 2) The pt's use of alcohol, tobacco, and illicit drugs 3) The patient's current medications and supplements 4) Functional ability including ADL's, fall risk, home safety risk, hearing and visual impairment 5) Diet and physical activities 6) Evidence for depression or mood disorder 7) The patient's height, weight,  and BMI have been recorded in the chart 8) I have ordered and reviewed a 12 lead EKG and find that there are no acute changes and patient is in sinus rhythm.     I have made referrals, and provided counseling and education based on review of the above    Other orders -     Cyanocobalamin ; INJECT INTO THE MUSCLE EVERY 14 DAYSAS DIRECTED.  Dispense: 10 mL; Refill: 1 -     Tamsulosin  HCl; Take 1 capsule (0.4 mg total) by mouth daily.  Dispense: 90 capsule; Refill: 3 -     Varenicline  Tartrate; Take 1 tablet (1 mg total) by mouth 2 (two) times daily. as directed  Dispense: 180 tablet; Refill: 0    Return in about 6 months (around 10/24/2023).  Thersia Flax, MD

## 2023-04-24 NOTE — Assessment & Plan Note (Signed)
 QUIT 3 MONTHS AGO WITH [facility] 

## 2023-04-25 ENCOUNTER — Encounter: Payer: Self-pay | Admitting: Internal Medicine

## 2023-04-25 LAB — LIPID PANEL
Cholesterol: 152 mg/dL (ref 0–200)
HDL: 44.8 mg/dL (ref >39.00)
LDL Cholesterol: 88 mg/dL (ref 0–99)
NonHDL: 107.26
Total CHOL/HDL Ratio: 3
Triglycerides: 94 mg/dL (ref 0.0–149.0)
VLDL: 18.8 mg/dL (ref 0.0–40.0)

## 2023-04-25 LAB — CBC WITH DIFFERENTIAL/PLATELET
Basophils Absolute: 0.1 10*3/uL (ref 0.0–0.1)
Basophils Relative: 1.1 % (ref 0.0–3.0)
Eosinophils Absolute: 0.2 10*3/uL (ref 0.0–0.7)
Eosinophils Relative: 2.3 % (ref 0.0–5.0)
HCT: 37.1 % — ABNORMAL LOW (ref 39.0–52.0)
Hemoglobin: 12.5 g/dL — ABNORMAL LOW (ref 13.0–17.0)
Lymphocytes Relative: 31.3 % (ref 12.0–46.0)
Lymphs Abs: 2.6 10*3/uL (ref 0.7–4.0)
MCHC: 33.8 g/dL (ref 30.0–36.0)
MCV: 80.7 fl (ref 78.0–100.0)
Monocytes Absolute: 0.7 10*3/uL (ref 0.1–1.0)
Monocytes Relative: 8.3 % (ref 3.0–12.0)
Neutro Abs: 4.8 10*3/uL (ref 1.4–7.7)
Neutrophils Relative %: 57 % (ref 43.0–77.0)
Platelets: 225 10*3/uL (ref 150.0–400.0)
RBC: 4.59 Mil/uL (ref 4.22–5.81)
RDW: 15.3 % (ref 11.5–15.5)
WBC: 8.4 10*3/uL (ref 4.0–10.5)

## 2023-04-25 LAB — COMPREHENSIVE METABOLIC PANEL WITH GFR
ALT: 15 U/L (ref 0–53)
AST: 19 U/L (ref 0–37)
Albumin: 4.3 g/dL (ref 3.5–5.2)
Alkaline Phosphatase: 61 U/L (ref 39–117)
BUN: 10 mg/dL (ref 6–23)
CO2: 27 meq/L (ref 19–32)
Calcium: 8.7 mg/dL (ref 8.4–10.5)
Chloride: 104 meq/L (ref 96–112)
Creatinine, Ser: 0.96 mg/dL (ref 0.40–1.50)
GFR: 83.97 mL/min (ref >60.00)
Glucose, Bld: 98 mg/dL (ref 70–99)
Potassium: 4.2 meq/L (ref 3.5–5.1)
Sodium: 141 meq/L (ref 135–145)
Total Bilirubin: 0.3 mg/dL (ref 0.2–1.2)
Total Protein: 6.8 g/dL (ref 6.0–8.3)

## 2023-04-25 LAB — HEMOGLOBIN A1C: Hgb A1c MFr Bld: 5.9 % (ref 4.6–6.5)

## 2023-04-25 LAB — TSH: TSH: 4.85 u[IU]/mL (ref 0.35–5.50)

## 2023-04-25 LAB — PSA: PSA: 0.36 ng/mL (ref 0.10–4.00)

## 2023-04-25 LAB — LDL CHOLESTEROL, DIRECT: Direct LDL: 94 mg/dL

## 2023-04-25 NOTE — Assessment & Plan Note (Signed)

## 2023-04-25 NOTE — Addendum Note (Signed)
 Addended by: Thersia Flax on: 04/25/2023 01:33 PM   Modules accepted: Orders

## 2023-04-25 NOTE — Assessment & Plan Note (Signed)
 He continues to decline follow up imaging or biopsy based on review today

## 2023-04-27 ENCOUNTER — Encounter: Payer: Self-pay | Admitting: Internal Medicine

## 2023-04-28 ENCOUNTER — Ambulatory Visit: Admitting: Internal Medicine

## 2023-04-28 ENCOUNTER — Other Ambulatory Visit (INDEPENDENT_AMBULATORY_CARE_PROVIDER_SITE_OTHER)

## 2023-04-28 DIAGNOSIS — D513 Other dietary vitamin B12 deficiency anemia: Secondary | ICD-10-CM | POA: Diagnosis not present

## 2023-04-28 NOTE — Addendum Note (Signed)
 Addended by: Thressa Flora D on: 04/28/2023 02:43 PM   Modules accepted: Orders

## 2023-04-28 NOTE — Progress Notes (Signed)
 Patient is scheduled for Lab Appt on 04/28/23 at 3:30 pm.

## 2023-04-29 LAB — IRON,TIBC AND FERRITIN PANEL
%SAT: 18 % — ABNORMAL LOW (ref 20–48)
Ferritin: 8 ng/mL — ABNORMAL LOW (ref 24–380)
Iron: 68 ug/dL (ref 50–180)
TIBC: 388 ug/dL (ref 250–425)

## 2023-04-29 LAB — B12 AND FOLATE PANEL
Folate: 4.2 ng/mL — ABNORMAL LOW
Vitamin B-12: 756 pg/mL (ref 200–1100)

## 2023-04-30 ENCOUNTER — Encounter: Payer: Self-pay | Admitting: Internal Medicine

## 2023-05-01 ENCOUNTER — Encounter: Payer: Self-pay | Admitting: Internal Medicine

## 2023-05-01 MED ORDER — FOLIC ACID 1 MG PO TABS: 1.0000 mg | ORAL_TABLET | Freq: Every day | ORAL | 0 refills | Status: DC

## 2023-05-01 MED ORDER — IRON (FERROUS SULFATE) 325 (65 FE) MG PO TABS: 325.0000 mg | ORAL_TABLET | Freq: Every day | ORAL | 0 refills | Status: DC

## 2023-05-01 NOTE — Addendum Note (Signed)
 Addended by: Thersia Flax on: 05/01/2023 12:39 PM   Modules accepted: Orders

## 2023-05-01 NOTE — Assessment & Plan Note (Signed)
 Diagnosed during admission for  severe hemolytic anemia. Continue monthly  injections done by his wife .    Folic acid  and iron are also low Lab Results  Component Value Date   VITAMINB12 756 04/28/2023   Lab Results  Component Value Date   FOLATE 4.2 (L) 04/28/2023    Lab Results  Component Value Date   IRON 68 04/28/2023   TIBC 388 04/28/2023   FERRITIN 8 (L) 04/28/2023

## 2023-05-01 NOTE — Telephone Encounter (Signed)
 I have printed and placed in quick sign folder for signature.

## 2023-05-13 ENCOUNTER — Encounter: Payer: Self-pay | Admitting: Internal Medicine

## 2023-05-15 NOTE — Telephone Encounter (Signed)
 noted

## 2023-08-02 ENCOUNTER — Encounter: Payer: Self-pay | Admitting: Internal Medicine

## 2023-08-03 ENCOUNTER — Encounter: Payer: Self-pay | Admitting: Internal Medicine

## 2023-08-07 ENCOUNTER — Other Ambulatory Visit (INDEPENDENT_AMBULATORY_CARE_PROVIDER_SITE_OTHER)

## 2023-08-07 DIAGNOSIS — E538 Deficiency of other specified B group vitamins: Secondary | ICD-10-CM

## 2023-08-07 DIAGNOSIS — D508 Other iron deficiency anemias: Secondary | ICD-10-CM

## 2023-08-07 NOTE — Addendum Note (Signed)
 Addended by: TANDA HARVEY D on: 08/07/2023 03:07 PM   Modules accepted: Orders

## 2023-08-09 LAB — CBC WITH DIFFERENTIAL/PLATELET
Absolute Lymphocytes: 2440 {cells}/uL (ref 850–3900)
Absolute Monocytes: 664 {cells}/uL (ref 200–950)
Basophils Absolute: 32 {cells}/uL (ref 0–200)
Basophils Relative: 0.4 %
Eosinophils Absolute: 112 {cells}/uL (ref 15–500)
Eosinophils Relative: 1.4 %
HCT: 43 % (ref 38.5–50.0)
Hemoglobin: 13.7 g/dL (ref 13.2–17.1)
MCH: 29 pg (ref 27.0–33.0)
MCHC: 31.9 g/dL — ABNORMAL LOW (ref 32.0–36.0)
MCV: 91.1 fL (ref 80.0–100.0)
MPV: 9.8 fL (ref 7.5–12.5)
Monocytes Relative: 8.3 %
Neutro Abs: 4752 {cells}/uL (ref 1500–7800)
Neutrophils Relative %: 59.4 %
Platelets: 170 Thousand/uL (ref 140–400)
RBC: 4.72 Million/uL (ref 4.20–5.80)
RDW: 14.6 % (ref 11.0–15.0)
Total Lymphocyte: 30.5 %
WBC: 8 Thousand/uL (ref 3.8–10.8)

## 2023-08-09 LAB — FOLATE RBC: RBC Folate: 601 ng/mL (ref >280)

## 2023-08-09 LAB — IRON,TIBC AND FERRITIN PANEL
%SAT: 17 % — ABNORMAL LOW (ref 20–48)
Ferritin: 23 ng/mL — ABNORMAL LOW (ref 24–380)
Iron: 51 ug/dL (ref 50–180)
TIBC: 309 ug/dL (ref 250–425)

## 2023-08-10 ENCOUNTER — Encounter: Payer: Self-pay | Admitting: Internal Medicine

## 2023-08-10 ENCOUNTER — Ambulatory Visit: Payer: Self-pay | Admitting: Internal Medicine

## 2023-09-30 ENCOUNTER — Encounter: Payer: Self-pay | Admitting: Internal Medicine

## 2023-09-30 DIAGNOSIS — J301 Allergic rhinitis due to pollen: Secondary | ICD-10-CM

## 2023-09-30 DIAGNOSIS — D594 Other nonautoimmune hemolytic anemias: Secondary | ICD-10-CM

## 2023-09-30 DIAGNOSIS — E538 Deficiency of other specified B group vitamins: Secondary | ICD-10-CM

## 2023-10-05 MED ORDER — FOLIC ACID 1 MG PO TABS: 1.0000 mg | ORAL_TABLET | Freq: Every day | ORAL | 0 refills | Status: AC

## 2023-10-05 MED ORDER — TAMSULOSIN HCL 0.4 MG PO CAPS: 0.4000 mg | ORAL_CAPSULE | Freq: Every day | ORAL | 0 refills | Status: AC

## 2023-10-05 MED ORDER — MONTELUKAST SODIUM 10 MG PO TABS
10.0000 mg | ORAL_TABLET | Freq: Every day | ORAL | 0 refills | Status: AC
Start: 2023-10-05 — End: ?

## 2023-10-05 MED ORDER — SIMVASTATIN 40 MG PO TABS: 40.0000 mg | ORAL_TABLET | Freq: Every day | ORAL | 0 refills | Status: AC

## 2023-10-05 MED ORDER — BD LUER-LOK SYRINGE 25G X 5/8" 3 ML MISC
0 refills | Status: AC
Start: 2023-10-05 — End: ?

## 2023-10-05 MED ORDER — IRON (FERROUS SULFATE) 325 (65 FE) MG PO TABS: 325.0000 mg | ORAL_TABLET | Freq: Every day | ORAL | 0 refills | Status: AC

## 2023-10-05 NOTE — Addendum Note (Signed)
 Addended by: Fraidy Mccarrick on: 10/05/2023 10:02 AM   Modules accepted: Orders

## 2023-10-05 NOTE — Addendum Note (Signed)
 Addended by: Kassim Guertin on: 10/05/2023 03:46 PM   Modules accepted: Orders

## 2023-10-05 NOTE — Telephone Encounter (Signed)
 Pt requesting refills til appt with [facility]

## 2023-10-08 MED ORDER — VARENICLINE TARTRATE 1 MG PO TABS: 1.0000 mg | ORAL_TABLET | Freq: Two times a day (BID) | ORAL | 0 refills | Status: DC

## 2023-10-08 MED ORDER — TIZANIDINE HCL 4 MG PO TABS: 4.0000 mg | ORAL_TABLET | Freq: Four times a day (QID) | ORAL | 0 refills | Status: AC | PRN

## 2023-10-08 MED ORDER — CYANOCOBALAMIN 1000 MCG/ML IJ SOLN: INTRAMUSCULAR | 1 refills | Status: AC

## 2023-10-08 NOTE — Addendum Note (Signed)
 Addended by: MARYLYNN VERNEITA CROME on: 10/08/2023 03:59 PM   Modules accepted: Orders

## 2023-10-11 ENCOUNTER — Telehealth: Payer: Self-pay

## 2023-10-11 NOTE — Telephone Encounter (Signed)
 I left voicemail for patient asking him to please call us  to reschedule his 10/24/2023 appointment with Dr. Verneita Kettering, as she will be out of the office that day.  I also sent a message to patient via MyChart.  [id number] - when patient calls back, please assist him with rescheduling this appointment.

## 2023-10-24 ENCOUNTER — Ambulatory Visit: Admitting: Internal Medicine

## 2023-11-14 NOTE — Telephone Encounter (Signed)
 Printed and faxed

## 2023-11-15 ENCOUNTER — Other Ambulatory Visit: Payer: Self-pay | Admitting: Internal Medicine

## 2023-11-15 ENCOUNTER — Encounter: Payer: Self-pay | Admitting: Internal Medicine

## 2023-11-15 MED ORDER — VARENICLINE TARTRATE 1 MG PO TABS: 1.0000 mg | ORAL_TABLET | Freq: Two times a day (BID) | ORAL | 1 refills | Status: AC
